# Patient Record
Sex: Female | Born: 1940 | Race: Black or African American | Hispanic: No | Marital: Married | Smoking: Never smoker
Health system: Southern US, Community
[De-identification: ages and names within clinical notes are randomized; demographics above are authoritative.]

## PROBLEM LIST (undated history)

## (undated) DIAGNOSIS — Z801 Family history of malignant neoplasm of trachea, bronchus and lung: Secondary | ICD-10-CM

## (undated) DIAGNOSIS — E119 Type 2 diabetes mellitus without complications: Secondary | ICD-10-CM

## (undated) DIAGNOSIS — Z8042 Family history of malignant neoplasm of prostate: Secondary | ICD-10-CM

## (undated) DIAGNOSIS — Z800 Family history of malignant neoplasm of digestive organs: Secondary | ICD-10-CM

## (undated) DIAGNOSIS — Z807 Family history of other malignant neoplasms of lymphoid, hematopoietic and related tissues: Secondary | ICD-10-CM

## (undated) DIAGNOSIS — I10 Essential (primary) hypertension: Secondary | ICD-10-CM

## (undated) DIAGNOSIS — E7800 Pure hypercholesterolemia, unspecified: Secondary | ICD-10-CM

## (undated) DIAGNOSIS — E039 Hypothyroidism, unspecified: Secondary | ICD-10-CM

## (undated) DIAGNOSIS — C259 Malignant neoplasm of pancreas, unspecified: Secondary | ICD-10-CM

## (undated) HISTORY — DX: Family history of malignant neoplasm of digestive organs: Z80.0

## (undated) HISTORY — DX: Family history of malignant neoplasm of trachea, bronchus and lung: Z80.1

## (undated) HISTORY — DX: Family history of other malignant neoplasms of lymphoid, hematopoietic and related tissues: Z80.7

## (undated) HISTORY — DX: Family history of malignant neoplasm of prostate: Z80.42

## (undated) HISTORY — PX: FINGER SURGERY: SHX640

---

## 1997-07-04 ENCOUNTER — Ambulatory Visit (HOSPITAL_COMMUNITY): Admission: RE | Admit: 1997-07-04 | Discharge: 1997-07-04 | Payer: Self-pay | Admitting: Endocrinology

## 1998-07-07 ENCOUNTER — Ambulatory Visit (HOSPITAL_COMMUNITY): Admission: RE | Admit: 1998-07-07 | Discharge: 1998-07-07 | Payer: Self-pay | Admitting: Endocrinology

## 1998-07-07 ENCOUNTER — Encounter: Payer: Self-pay | Admitting: Endocrinology

## 1999-01-15 ENCOUNTER — Other Ambulatory Visit: Admission: RE | Admit: 1999-01-15 | Discharge: 1999-01-15 | Payer: Self-pay | Admitting: Endocrinology

## 1999-08-04 ENCOUNTER — Ambulatory Visit (HOSPITAL_COMMUNITY): Admission: RE | Admit: 1999-08-04 | Discharge: 1999-08-04 | Payer: Self-pay | Admitting: Endocrinology

## 1999-08-04 ENCOUNTER — Encounter: Payer: Self-pay | Admitting: Endocrinology

## 2000-03-03 ENCOUNTER — Other Ambulatory Visit: Admission: RE | Admit: 2000-03-03 | Discharge: 2000-03-03 | Payer: Self-pay | Admitting: Endocrinology

## 2000-10-28 ENCOUNTER — Encounter: Payer: Self-pay | Admitting: Endocrinology

## 2000-10-28 ENCOUNTER — Ambulatory Visit (HOSPITAL_COMMUNITY): Admission: RE | Admit: 2000-10-28 | Discharge: 2000-10-28 | Payer: Self-pay | Admitting: Endocrinology

## 2001-12-15 ENCOUNTER — Ambulatory Visit (HOSPITAL_COMMUNITY): Admission: RE | Admit: 2001-12-15 | Discharge: 2001-12-15 | Payer: Self-pay | Admitting: Endocrinology

## 2001-12-15 ENCOUNTER — Encounter: Payer: Self-pay | Admitting: Endocrinology

## 2002-05-03 ENCOUNTER — Other Ambulatory Visit: Admission: RE | Admit: 2002-05-03 | Discharge: 2002-05-03 | Payer: Self-pay | Admitting: Endocrinology

## 2003-09-03 ENCOUNTER — Other Ambulatory Visit: Admission: RE | Admit: 2003-09-03 | Discharge: 2003-09-03 | Payer: Self-pay | Admitting: Obstetrics & Gynecology

## 2003-09-12 ENCOUNTER — Ambulatory Visit (HOSPITAL_COMMUNITY): Admission: RE | Admit: 2003-09-12 | Discharge: 2003-09-12 | Payer: Self-pay | Admitting: Endocrinology

## 2003-10-28 ENCOUNTER — Encounter (INDEPENDENT_AMBULATORY_CARE_PROVIDER_SITE_OTHER): Payer: Self-pay | Admitting: Specialist

## 2003-10-28 ENCOUNTER — Ambulatory Visit (HOSPITAL_COMMUNITY): Admission: RE | Admit: 2003-10-28 | Discharge: 2003-10-28 | Payer: Self-pay | Admitting: Obstetrics & Gynecology

## 2004-10-21 ENCOUNTER — Ambulatory Visit (HOSPITAL_COMMUNITY): Admission: RE | Admit: 2004-10-21 | Discharge: 2004-10-21 | Payer: Self-pay | Admitting: Gastroenterology

## 2005-06-30 ENCOUNTER — Ambulatory Visit (HOSPITAL_COMMUNITY): Admission: RE | Admit: 2005-06-30 | Discharge: 2005-06-30 | Payer: Self-pay | Admitting: Endocrinology

## 2006-07-20 ENCOUNTER — Ambulatory Visit (HOSPITAL_COMMUNITY): Admission: RE | Admit: 2006-07-20 | Discharge: 2006-07-20 | Payer: Self-pay | Admitting: Endocrinology

## 2007-08-03 ENCOUNTER — Ambulatory Visit (HOSPITAL_COMMUNITY): Admission: RE | Admit: 2007-08-03 | Discharge: 2007-08-03 | Payer: Self-pay | Admitting: Endocrinology

## 2008-05-22 ENCOUNTER — Emergency Department (HOSPITAL_COMMUNITY): Admission: EM | Admit: 2008-05-22 | Discharge: 2008-05-22 | Payer: Self-pay | Admitting: Family Medicine

## 2008-08-08 ENCOUNTER — Ambulatory Visit (HOSPITAL_COMMUNITY): Admission: RE | Admit: 2008-08-08 | Discharge: 2008-08-08 | Payer: Self-pay | Admitting: Endocrinology

## 2009-08-12 ENCOUNTER — Ambulatory Visit (HOSPITAL_COMMUNITY): Admission: RE | Admit: 2009-08-12 | Discharge: 2009-08-12 | Payer: Self-pay | Admitting: Endocrinology

## 2009-08-21 ENCOUNTER — Encounter: Admission: RE | Admit: 2009-08-21 | Discharge: 2009-08-21 | Payer: Self-pay | Admitting: Endocrinology

## 2009-10-09 ENCOUNTER — Emergency Department (HOSPITAL_COMMUNITY): Admission: EM | Admit: 2009-10-09 | Discharge: 2009-10-09 | Payer: Self-pay | Admitting: Family Medicine

## 2010-09-03 ENCOUNTER — Other Ambulatory Visit (HOSPITAL_COMMUNITY): Payer: Self-pay | Admitting: Endocrinology

## 2010-09-03 DIAGNOSIS — Z1231 Encounter for screening mammogram for malignant neoplasm of breast: Secondary | ICD-10-CM

## 2010-09-04 NOTE — Op Note (Signed)
[name], Tina Patel                          ACCOUNT NO.:  [id number]   MEDICAL RECORD NO.:  [id number]                   PATIENT TYPE:  AMB   LOCATION:  SDC                                  FACILITY:  [facility]   PHYSICIAN:  Genia Del, M.D.             DATE OF BIRTH:  01-Apr-1941   DATE OF PROCEDURE:  10/28/2003  DATE OF DISCHARGE:                                 OPERATIVE REPORT   PREOPERATIVE DIAGNOSIS:  Postmenopausal bleeding with thickened endometrium.   POSTOPERATIVE DIAGNOSIS:  Intrauterine lesion, probable polyp, with  atrophied endometrium.   SURGEON:  Genia Del, M.D.   ANESTHESIA:  Burnett Corrente, M.D.   INTERVENTION:  Hysteroscopy with resection of intrauterine lesion and  dilatation and curettage.   PROCEDURE:  Under MAC analgesia, the patient is in lithotomy position.  She  is prepped with Hibiclens on the suprapubic, vulvar, and vaginal area.  The  bladder is catheterized and the patient is draped as usual.  The vaginal  exam reveals a retroverted uterus, normal volume, mobile, no adnexal mass.  We insert the speculum in the vagina.  The anterior lip of the cervix is  grasped with a tenaculum.  The paracervical block is done with Nesacaine 1%,  a total of 20 mL at 4 and 8 o'clock.  We then proceed with dilatation with  Hegar dilators up to #25 without difficulty.  A diagnostic hysteroscope is  introduced in the intrauterine cavity.  Both ostia are seen and pictures are  taken.  A posterior intrauterine lesion is visualized.  It is on a pedicle,  measuring about 2 cm in diameter.  It appears soft, compatible with a polyp.  A picture is taken.  We remove the diagnostic hysteroscope.  We complete  dilation at Hegar #37 easily.  We then insert the operative hysteroscope  with a single loop in the intrauterine cavity.  We proceed with resection of  the intrauterine lesion.  This is done easily with good vision.  The  specimen is sent to pathology.  The  lesion is completely resected.  We then  proceed with a systematic curettage on all endometrial cavity, and this is  sent separately to pathology.  We then take a last look with the  hysteroscope.  The cavity appears regular and normal, no lesion is seen.  The endometrium was thin, probably atrophied, so the endometrial curetting  specimen was small.  Hemostasis is adequate.  We take pictures as we come  out of the intrauterine cavity.  All instruments are removed.  Hemostasis is  adequate on the cervix as well.  The estimated blood loss was minimal.  Fluid deficit was 200 mL.  No complication occurred, and the patient was  brought to the recovery room in good, stable status.  Genia Del, M.D.    ML/MEDQ  D:  10/28/2003  T:  10/28/2003  Job:  [id number]

## 2010-09-04 NOTE — Op Note (Signed)
NAMELINDALOU, SOLTIS                ACCOUNT NO.:  [id number]   MEDICAL RECORD NO.:  [id number]          PATIENT TYPE:  AMB   LOCATION:  ENDO                         FACILITY:  [facility]   PHYSICIAN:  Anselmo Rod, M.D.  DATE OF BIRTH:  [birth year]   DATE OF PROCEDURE:  10/21/2004  DATE OF DISCHARGE:                                 OPERATIVE REPORT   PROCEDURE PERFORMED:  Screening colonoscopy.   ENDOSCOPIST:  Charna Elizabeth, M.D.   INSTRUMENT USED:  Olympus video colonoscope.   INDICATION FOR PROCEDURE:  This 70 year old African-American female  underwent screening colonoscopy to rule out colonic polyps, masses, etc.   PREPROCEDURE PREPARATION:  Informed consent was procured from the patient.  The patient fasted for 8 hours prior to the procedure and prepped with a  bottle of magnesium citrate and a gallon of GoLYTELY the night prior to the  procedure.  Risks and benefits of the procedure including a 10% miss rate of  cancer and polyps was discussed with the patient as well.   PREPROCEDURE PHYSICAL:  Patient had stable vital signs.  NECK:  Supple.  CHEST:  Clear to auscultation.  S1, S2 regular.  ABDOMEN:  Soft with normal bowel sounds.   DESCRIPTION OF THE PROCEDURE:  The patient was placed in the left lateral  decubitus position, sedated with 5-0 mg of Demerol and 5 mg of Versed in  slow incremental doses.  Once the patient was adequately sedated and  maintained on low flow oxygen and continuous cardiac monitoring, the Olympus  video colonoscope was advanced from the rectum to the cecum.  The  appendicular orifice and ileocecal valve were visualized and photographed.  The patient had pan diverticular disease with more prominent changes in the  left colon, small internal  hemorrhoids were seen on retroflexion.  The rest  of the exam was unremarkable.  There was some residual stool in the colon,  multiple washings were done.  Retroflexion in the rectum revealed a  prominent internal  hemorrhoid.  The patient tolerated the procedure well  without complications.   IMPRESSION:  1.  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  2.  Prominent internal hemorrhoids seen on retroflexion.  3.  No masses or polyps seen.   RECOMMENDATIONS:  1.  Continue a high fiber diet with liberal fluid intake.  2.  Brochures on diverticulosis have been given to the patient for      education.  3.  Repeat colonoscopy in the next 10 years unless the patient develops any      abnormal symptoms in the interim.  4.  Outpatient followup as need arises in the future.       JNM/MEDQ  D:  10/22/2004  T:  10/22/2004  Job:  [id number]   cc:   Genia Del, M.D.  [address]  [place]  [place] [address]  Fax: [phone]   Brooke Bonito, M.D.  [address]  [place]  [place] [address]  Fax: [phone]

## 2010-09-15 ENCOUNTER — Ambulatory Visit (HOSPITAL_COMMUNITY): Payer: Medicare Other

## 2010-09-15 ENCOUNTER — Ambulatory Visit (HOSPITAL_COMMUNITY)
Admission: RE | Admit: 2010-09-15 | Discharge: 2010-09-15 | Disposition: A | Discharge status: Home / Self Care | Payer: Medicare Other | Source: Ambulatory Visit | Attending: Endocrinology | Admitting: Endocrinology

## 2010-09-15 DIAGNOSIS — Z1231 Encounter for screening mammogram for malignant neoplasm of breast: Secondary | ICD-10-CM

## 2010-09-17 ENCOUNTER — Other Ambulatory Visit: Payer: Self-pay | Admitting: Endocrinology

## 2010-09-17 DIAGNOSIS — R928 Other abnormal and inconclusive findings on diagnostic imaging of breast: Secondary | ICD-10-CM

## 2010-09-24 ENCOUNTER — Ambulatory Visit
Admission: RE | Admit: 2010-09-24 | Discharge: 2010-09-24 | Disposition: A | Discharge status: Home / Self Care | Payer: Medicare Other | Source: Ambulatory Visit | Attending: Endocrinology | Admitting: Endocrinology

## 2010-09-24 DIAGNOSIS — R928 Other abnormal and inconclusive findings on diagnostic imaging of breast: Secondary | ICD-10-CM

## 2011-05-05 ENCOUNTER — Emergency Department (HOSPITAL_COMMUNITY)
Admission: EM | Admit: 2011-05-05 | Discharge: 2011-05-05 | Disposition: A | Discharge status: Home / Self Care | Payer: Medicare Other | Source: Home / Self Care | Attending: Family Medicine | Admitting: Family Medicine

## 2011-05-05 ENCOUNTER — Encounter (HOSPITAL_COMMUNITY): Payer: Self-pay | Admitting: *Deleted

## 2011-05-05 DIAGNOSIS — IMO0001 Reserved for inherently not codable concepts without codable children: Secondary | ICD-10-CM

## 2011-05-05 DIAGNOSIS — L03019 Cellulitis of unspecified finger: Secondary | ICD-10-CM

## 2011-05-05 HISTORY — DX: Hypothyroidism, unspecified: E03.9

## 2011-05-05 HISTORY — DX: Pure hypercholesterolemia, unspecified: E78.00

## 2011-05-05 LAB — CULTURE, ROUTINE-ABSCESS

## 2011-05-05 MED ORDER — DOXYCYCLINE HYCLATE 100 MG PO CAPS: 100.0000 mg | ORAL_CAPSULE | Freq: Two times a day (BID) | ORAL | Status: AC

## 2011-05-05 NOTE — ED Notes (Signed)
Pain swelling right middle finger nail bed onset x 6 days progressively worse

## 2011-05-05 NOTE — ED Provider Notes (Signed)
History     CSN: [id number]  Arrival date & time 05/05/11  1909   First MD Initiated Contact with Patient 05/05/11 1925      No chief complaint on file.   (Consider location/radiation/quality/duration/timing/severity/associated sxs/prior treatment) Patient is a 71 y.o. female presenting with hand pain. The history is provided by the patient.  Hand Pain This is a new problem. The current episode started more than 2 days ago (pt thinks she may have stuck finger with needle). The problem occurs constantly. The problem has been gradually worsening. She has tried nothing for the symptoms.    Past Medical History  Diagnosis Date  . Diabetes mellitus   . High cholesterol   . Hypothyroid     Past Surgical History  Procedure Date  . Finger surgery     History reviewed. No pertinent family history.  History  Substance Use Topics  . Smoking status: Never Smoker   . Smokeless tobacco: Not on file  . Alcohol Use: No    OB History    Grav Para Term Preterm Abortions TAB SAB Ect Mult Living                  Review of Systems  Constitutional: Negative.   Musculoskeletal: Positive for joint swelling.  Skin: Positive for wound.    Allergies  Review of patient's allergies indicates no known allergies.  Home Medications   Current Outpatient Rx  Name Route Sig Dispense Refill  . LEVOTHYROXINE SODIUM 88 MCG PO TABS Oral Take 88 mcg by mouth daily.    Marland Kitchen METFORMIN HCL 500 MG PO TABS Oral Take 500 mg by mouth once.    Marland Kitchen ROSUVASTATIN CALCIUM 20 MG PO TABS Oral Take 20 mg by mouth daily.    Marland Kitchen DOXYCYCLINE HYCLATE 100 MG PO CAPS Oral Take 1 capsule (100 mg total) by mouth 2 (two) times daily. 20 capsule 0    BP 158/85  Pulse 87  Temp(Src) 98 F (36.7 C) (Oral)  Resp 17  SpO2 96%  Physical Exam  Nursing note and vitals reviewed. Constitutional: She appears well-developed and well-nourished.  Musculoskeletal: She exhibits edema and tenderness.       Hands: Skin: Skin is  warm and dry.    ED Course  INCISION AND DRAINAGE Date/Time: 05/05/2011 9:10 PM Performed by: Barkley Bruns Authorized by: Barkley Bruns Consent: Verbal consent obtained. Type: abscess Body area: upper extremity Location details: right long finger Local anesthetic: topical anesthetic Patient sedated: no Scalpel size: 11 Incision type: single straight Complexity: simple Drainage: purulent Drainage amount: moderate Wound treatment: wound left open Patient tolerance: Patient tolerated the procedure well with no immediate complications.   (including critical care time)   Labs Reviewed  CULTURE, ROUTINE-ABSCESS   No results found.   1. Paronychia of third finger of right hand       MDM  I+D performed, purulent fluid.        Barkley Bruns, MD 05/05/11 2115

## 2011-05-11 ENCOUNTER — Telehealth (HOSPITAL_COMMUNITY): Payer: Self-pay | Admitting: *Deleted

## 2011-05-11 NOTE — ED Notes (Addendum)
Abscess culture R middle finger: few E. Coli.  Pt. treated with doxycycline not on sensitivity. Order obtained from Dr. Ladon Applebaum for Bactrim DS 1 tab po BID x 10 days. I called and husband said pt. is hard of hearint. I told him to tell pt. to stop Doxycycline and start Bactrim DS twice/day x 10 days. He said to call Rx. to [facility] on [address]. Rx. called to pharmacist @ [phone]. Vassie Moselle 05/11/2011

## 2011-10-04 ENCOUNTER — Other Ambulatory Visit (HOSPITAL_COMMUNITY): Payer: Self-pay | Admitting: Endocrinology

## 2011-10-04 DIAGNOSIS — Z1231 Encounter for screening mammogram for malignant neoplasm of breast: Secondary | ICD-10-CM

## 2011-10-28 ENCOUNTER — Ambulatory Visit (HOSPITAL_COMMUNITY)
Admission: RE | Admit: 2011-10-28 | Discharge: 2011-10-28 | Disposition: A | Discharge status: Home / Self Care | Payer: Medicare Other | Source: Ambulatory Visit | Attending: Endocrinology | Admitting: Endocrinology

## 2011-10-28 DIAGNOSIS — Z1231 Encounter for screening mammogram for malignant neoplasm of breast: Secondary | ICD-10-CM | POA: Insufficient documentation

## 2012-11-15 ENCOUNTER — Other Ambulatory Visit (HOSPITAL_COMMUNITY): Payer: Self-pay | Admitting: Endocrinology

## 2012-11-15 DIAGNOSIS — Z1231 Encounter for screening mammogram for malignant neoplasm of breast: Secondary | ICD-10-CM

## 2012-11-20 ENCOUNTER — Ambulatory Visit (HOSPITAL_COMMUNITY)
Admission: RE | Admit: 2012-11-20 | Discharge: 2012-11-20 | Disposition: A | Discharge status: Home / Self Care | Payer: Medicare Other | Source: Ambulatory Visit | Attending: Endocrinology | Admitting: Endocrinology

## 2012-11-20 DIAGNOSIS — Z1231 Encounter for screening mammogram for malignant neoplasm of breast: Secondary | ICD-10-CM | POA: Insufficient documentation

## 2013-10-30 ENCOUNTER — Other Ambulatory Visit (HOSPITAL_COMMUNITY): Payer: Self-pay | Admitting: Endocrinology

## 2013-10-30 DIAGNOSIS — Z1231 Encounter for screening mammogram for malignant neoplasm of breast: Secondary | ICD-10-CM

## 2013-11-21 ENCOUNTER — Ambulatory Visit (HOSPITAL_COMMUNITY)
Admission: RE | Admit: 2013-11-21 | Discharge: 2013-11-21 | Disposition: A | Discharge status: Home / Self Care | Payer: Medicare Other | Source: Ambulatory Visit | Attending: Endocrinology | Admitting: Endocrinology

## 2013-11-21 DIAGNOSIS — Z1231 Encounter for screening mammogram for malignant neoplasm of breast: Secondary | ICD-10-CM

## 2014-09-03 DIAGNOSIS — Z1231 Encounter for screening mammogram for malignant neoplasm of breast: Secondary | ICD-10-CM | POA: Diagnosis not present

## 2014-09-03 DIAGNOSIS — Z01419 Encounter for gynecological examination (general) (routine) without abnormal findings: Secondary | ICD-10-CM | POA: Diagnosis not present

## 2014-09-19 DIAGNOSIS — E789 Disorder of lipoprotein metabolism, unspecified: Secondary | ICD-10-CM | POA: Diagnosis not present

## 2014-09-19 DIAGNOSIS — E118 Type 2 diabetes mellitus with unspecified complications: Secondary | ICD-10-CM | POA: Diagnosis not present

## 2014-09-19 DIAGNOSIS — E0789 Other specified disorders of thyroid: Secondary | ICD-10-CM | POA: Diagnosis not present

## 2014-09-26 DIAGNOSIS — E118 Type 2 diabetes mellitus with unspecified complications: Secondary | ICD-10-CM | POA: Diagnosis not present

## 2014-09-26 DIAGNOSIS — E032 Hypothyroidism due to medicaments and other exogenous substances: Secondary | ICD-10-CM | POA: Diagnosis not present

## 2014-09-26 DIAGNOSIS — E789 Disorder of lipoprotein metabolism, unspecified: Secondary | ICD-10-CM | POA: Diagnosis not present

## 2014-10-08 DIAGNOSIS — K5730 Diverticulosis of large intestine without perforation or abscess without bleeding: Secondary | ICD-10-CM | POA: Diagnosis not present

## 2014-10-08 DIAGNOSIS — K921 Melena: Secondary | ICD-10-CM | POA: Diagnosis not present

## 2014-10-08 DIAGNOSIS — Z1211 Encounter for screening for malignant neoplasm of colon: Secondary | ICD-10-CM | POA: Diagnosis not present

## 2014-11-27 DIAGNOSIS — K921 Melena: Secondary | ICD-10-CM | POA: Diagnosis not present

## 2014-11-27 DIAGNOSIS — D374 Neoplasm of uncertain behavior of colon: Secondary | ICD-10-CM | POA: Diagnosis not present

## 2014-11-27 DIAGNOSIS — D120 Benign neoplasm of cecum: Secondary | ICD-10-CM | POA: Diagnosis not present

## 2014-11-27 DIAGNOSIS — K5730 Diverticulosis of large intestine without perforation or abscess without bleeding: Secondary | ICD-10-CM | POA: Diagnosis not present

## 2014-11-27 DIAGNOSIS — K635 Polyp of colon: Secondary | ICD-10-CM | POA: Diagnosis not present

## 2014-11-27 DIAGNOSIS — Z1211 Encounter for screening for malignant neoplasm of colon: Secondary | ICD-10-CM | POA: Diagnosis not present

## 2015-03-18 DIAGNOSIS — E0789 Other specified disorders of thyroid: Secondary | ICD-10-CM | POA: Diagnosis not present

## 2015-03-18 DIAGNOSIS — Z79899 Other long term (current) drug therapy: Secondary | ICD-10-CM | POA: Diagnosis not present

## 2015-03-18 DIAGNOSIS — E789 Disorder of lipoprotein metabolism, unspecified: Secondary | ICD-10-CM | POA: Diagnosis not present

## 2015-03-18 DIAGNOSIS — I10 Essential (primary) hypertension: Secondary | ICD-10-CM | POA: Diagnosis not present

## 2015-03-18 DIAGNOSIS — E118 Type 2 diabetes mellitus with unspecified complications: Secondary | ICD-10-CM | POA: Diagnosis not present

## 2015-03-25 DIAGNOSIS — E118 Type 2 diabetes mellitus with unspecified complications: Secondary | ICD-10-CM | POA: Diagnosis not present

## 2015-03-25 DIAGNOSIS — E789 Disorder of lipoprotein metabolism, unspecified: Secondary | ICD-10-CM | POA: Diagnosis not present

## 2015-03-25 DIAGNOSIS — I10 Essential (primary) hypertension: Secondary | ICD-10-CM | POA: Diagnosis not present

## 2015-03-25 DIAGNOSIS — E032 Hypothyroidism due to medicaments and other exogenous substances: Secondary | ICD-10-CM | POA: Diagnosis not present

## 2015-06-10 DIAGNOSIS — E119 Type 2 diabetes mellitus without complications: Secondary | ICD-10-CM | POA: Diagnosis not present

## 2015-09-12 DIAGNOSIS — Z124 Encounter for screening for malignant neoplasm of cervix: Secondary | ICD-10-CM | POA: Diagnosis not present

## 2015-09-12 DIAGNOSIS — Z1231 Encounter for screening mammogram for malignant neoplasm of breast: Secondary | ICD-10-CM | POA: Diagnosis not present

## 2015-09-16 DIAGNOSIS — E118 Type 2 diabetes mellitus with unspecified complications: Secondary | ICD-10-CM | POA: Diagnosis not present

## 2015-09-16 DIAGNOSIS — E789 Disorder of lipoprotein metabolism, unspecified: Secondary | ICD-10-CM | POA: Diagnosis not present

## 2015-09-16 DIAGNOSIS — E0789 Other specified disorders of thyroid: Secondary | ICD-10-CM | POA: Diagnosis not present

## 2015-09-23 DIAGNOSIS — E118 Type 2 diabetes mellitus with unspecified complications: Secondary | ICD-10-CM | POA: Diagnosis not present

## 2015-09-23 DIAGNOSIS — E032 Hypothyroidism due to medicaments and other exogenous substances: Secondary | ICD-10-CM | POA: Diagnosis not present

## 2015-09-23 DIAGNOSIS — I10 Essential (primary) hypertension: Secondary | ICD-10-CM | POA: Diagnosis not present

## 2015-10-07 DIAGNOSIS — H02825 Cysts of left lower eyelid: Secondary | ICD-10-CM | POA: Diagnosis not present

## 2016-03-30 DIAGNOSIS — E118 Type 2 diabetes mellitus with unspecified complications: Secondary | ICD-10-CM | POA: Diagnosis not present

## 2016-03-30 DIAGNOSIS — I10 Essential (primary) hypertension: Secondary | ICD-10-CM | POA: Diagnosis not present

## 2016-04-06 DIAGNOSIS — E789 Disorder of lipoprotein metabolism, unspecified: Secondary | ICD-10-CM | POA: Diagnosis not present

## 2016-04-06 DIAGNOSIS — E118 Type 2 diabetes mellitus with unspecified complications: Secondary | ICD-10-CM | POA: Diagnosis not present

## 2016-04-06 DIAGNOSIS — I10 Essential (primary) hypertension: Secondary | ICD-10-CM | POA: Diagnosis not present

## 2016-04-06 DIAGNOSIS — H6090 Unspecified otitis externa, unspecified ear: Secondary | ICD-10-CM | POA: Diagnosis not present

## 2016-09-01 DIAGNOSIS — E119 Type 2 diabetes mellitus without complications: Secondary | ICD-10-CM | POA: Diagnosis not present

## 2016-09-16 DIAGNOSIS — Z1231 Encounter for screening mammogram for malignant neoplasm of breast: Secondary | ICD-10-CM | POA: Diagnosis not present

## 2016-09-28 DIAGNOSIS — N390 Urinary tract infection, site not specified: Secondary | ICD-10-CM | POA: Diagnosis not present

## 2016-09-28 DIAGNOSIS — Z0000 Encounter for general adult medical examination without abnormal findings: Secondary | ICD-10-CM | POA: Diagnosis not present

## 2016-09-28 DIAGNOSIS — E118 Type 2 diabetes mellitus with unspecified complications: Secondary | ICD-10-CM | POA: Diagnosis not present

## 2016-09-28 DIAGNOSIS — E789 Disorder of lipoprotein metabolism, unspecified: Secondary | ICD-10-CM | POA: Diagnosis not present

## 2016-10-05 DIAGNOSIS — E118 Type 2 diabetes mellitus with unspecified complications: Secondary | ICD-10-CM | POA: Diagnosis not present

## 2016-10-05 DIAGNOSIS — E789 Disorder of lipoprotein metabolism, unspecified: Secondary | ICD-10-CM | POA: Diagnosis not present

## 2016-10-05 DIAGNOSIS — E032 Hypothyroidism due to medicaments and other exogenous substances: Secondary | ICD-10-CM | POA: Diagnosis not present

## 2016-10-05 DIAGNOSIS — N390 Urinary tract infection, site not specified: Secondary | ICD-10-CM | POA: Diagnosis not present

## 2017-01-04 DIAGNOSIS — H25013 Cortical age-related cataract, bilateral: Secondary | ICD-10-CM | POA: Diagnosis not present

## 2017-01-04 DIAGNOSIS — H25043 Posterior subcapsular polar age-related cataract, bilateral: Secondary | ICD-10-CM | POA: Diagnosis not present

## 2017-01-04 DIAGNOSIS — H2513 Age-related nuclear cataract, bilateral: Secondary | ICD-10-CM | POA: Diagnosis not present

## 2017-01-04 DIAGNOSIS — H2512 Age-related nuclear cataract, left eye: Secondary | ICD-10-CM | POA: Diagnosis not present

## 2017-01-04 DIAGNOSIS — H18411 Arcus senilis, right eye: Secondary | ICD-10-CM | POA: Diagnosis not present

## 2017-01-20 ENCOUNTER — Other Ambulatory Visit: Payer: Self-pay

## 2017-01-20 NOTE — Patient Outreach (Signed)
[facility]) Care Management  01/20/2017  Tina Patel November 08, 1940 [id number]   Medication adherence call to Tina Patel the reason for this call is because Tina Patel is showing past due under [facility] Ins.on her pravastatin 80 mg spoke to patient husband he said she is only taking this medication three times a week instead of once a day because of side effects per doctor Tina Patel new instructions.   [facility] Direct Dial [phone]  Fax [phone] [name]

## 2017-04-07 ENCOUNTER — Other Ambulatory Visit: Payer: Self-pay | Admitting: Internal Medicine

## 2017-04-07 DIAGNOSIS — Z1231 Encounter for screening mammogram for malignant neoplasm of breast: Secondary | ICD-10-CM

## 2017-10-03 DIAGNOSIS — Z1231 Encounter for screening mammogram for malignant neoplasm of breast: Secondary | ICD-10-CM | POA: Diagnosis not present

## 2017-10-03 DIAGNOSIS — Z01419 Encounter for gynecological examination (general) (routine) without abnormal findings: Secondary | ICD-10-CM | POA: Diagnosis not present

## 2017-10-04 DIAGNOSIS — N390 Urinary tract infection, site not specified: Secondary | ICD-10-CM | POA: Diagnosis not present

## 2017-10-04 DIAGNOSIS — E118 Type 2 diabetes mellitus with unspecified complications: Secondary | ICD-10-CM | POA: Diagnosis not present

## 2017-10-04 DIAGNOSIS — E039 Hypothyroidism, unspecified: Secondary | ICD-10-CM | POA: Diagnosis not present

## 2017-10-04 DIAGNOSIS — I10 Essential (primary) hypertension: Secondary | ICD-10-CM | POA: Diagnosis not present

## 2017-10-11 DIAGNOSIS — I10 Essential (primary) hypertension: Secondary | ICD-10-CM | POA: Diagnosis not present

## 2017-10-11 DIAGNOSIS — E039 Hypothyroidism, unspecified: Secondary | ICD-10-CM | POA: Diagnosis not present

## 2017-10-11 DIAGNOSIS — E0789 Other specified disorders of thyroid: Secondary | ICD-10-CM | POA: Diagnosis not present

## 2017-10-11 DIAGNOSIS — Z23 Encounter for immunization: Secondary | ICD-10-CM | POA: Diagnosis not present

## 2017-10-11 DIAGNOSIS — Z0000 Encounter for general adult medical examination without abnormal findings: Secondary | ICD-10-CM | POA: Diagnosis not present

## 2017-10-11 DIAGNOSIS — E7800 Pure hypercholesterolemia, unspecified: Secondary | ICD-10-CM | POA: Diagnosis not present

## 2017-10-11 DIAGNOSIS — E118 Type 2 diabetes mellitus with unspecified complications: Secondary | ICD-10-CM | POA: Diagnosis not present

## 2017-10-27 DIAGNOSIS — M79641 Pain in right hand: Secondary | ICD-10-CM | POA: Diagnosis not present

## 2018-01-05 DIAGNOSIS — E0789 Other specified disorders of thyroid: Secondary | ICD-10-CM | POA: Diagnosis not present

## 2018-01-05 DIAGNOSIS — E118 Type 2 diabetes mellitus with unspecified complications: Secondary | ICD-10-CM | POA: Diagnosis not present

## 2018-01-12 DIAGNOSIS — E118 Type 2 diabetes mellitus with unspecified complications: Secondary | ICD-10-CM | POA: Diagnosis not present

## 2018-01-12 DIAGNOSIS — I10 Essential (primary) hypertension: Secondary | ICD-10-CM | POA: Diagnosis not present

## 2018-01-12 DIAGNOSIS — E039 Hypothyroidism, unspecified: Secondary | ICD-10-CM | POA: Diagnosis not present

## 2018-03-01 DIAGNOSIS — R300 Dysuria: Secondary | ICD-10-CM | POA: Diagnosis not present

## 2018-03-01 DIAGNOSIS — N390 Urinary tract infection, site not specified: Secondary | ICD-10-CM | POA: Diagnosis not present

## 2018-03-01 DIAGNOSIS — R8290 Unspecified abnormal findings in urine: Secondary | ICD-10-CM | POA: Diagnosis not present

## 2018-07-06 DIAGNOSIS — Z0000 Encounter for general adult medical examination without abnormal findings: Secondary | ICD-10-CM | POA: Diagnosis not present

## 2018-07-06 DIAGNOSIS — E118 Type 2 diabetes mellitus with unspecified complications: Secondary | ICD-10-CM | POA: Diagnosis not present

## 2018-07-06 DIAGNOSIS — E7800 Pure hypercholesterolemia, unspecified: Secondary | ICD-10-CM | POA: Diagnosis not present

## 2018-07-06 DIAGNOSIS — E785 Hyperlipidemia, unspecified: Secondary | ICD-10-CM | POA: Diagnosis not present

## 2018-07-06 DIAGNOSIS — I10 Essential (primary) hypertension: Secondary | ICD-10-CM | POA: Diagnosis not present

## 2018-07-10 DIAGNOSIS — E118 Type 2 diabetes mellitus with unspecified complications: Secondary | ICD-10-CM | POA: Diagnosis not present

## 2018-07-13 DIAGNOSIS — E785 Hyperlipidemia, unspecified: Secondary | ICD-10-CM | POA: Diagnosis not present

## 2018-07-13 DIAGNOSIS — E039 Hypothyroidism, unspecified: Secondary | ICD-10-CM | POA: Diagnosis not present

## 2018-07-13 DIAGNOSIS — D179 Benign lipomatous neoplasm, unspecified: Secondary | ICD-10-CM | POA: Diagnosis not present

## 2018-07-13 DIAGNOSIS — I10 Essential (primary) hypertension: Secondary | ICD-10-CM | POA: Diagnosis not present

## 2018-07-13 DIAGNOSIS — E118 Type 2 diabetes mellitus with unspecified complications: Secondary | ICD-10-CM | POA: Diagnosis not present

## 2018-07-31 DIAGNOSIS — M546 Pain in thoracic spine: Secondary | ICD-10-CM | POA: Diagnosis not present

## 2018-07-31 DIAGNOSIS — M549 Dorsalgia, unspecified: Secondary | ICD-10-CM | POA: Diagnosis not present

## 2018-07-31 DIAGNOSIS — M5136 Other intervertebral disc degeneration, lumbar region: Secondary | ICD-10-CM | POA: Diagnosis not present

## 2018-11-08 DIAGNOSIS — I10 Essential (primary) hypertension: Secondary | ICD-10-CM | POA: Diagnosis not present

## 2018-11-08 DIAGNOSIS — E039 Hypothyroidism, unspecified: Secondary | ICD-10-CM | POA: Diagnosis not present

## 2018-11-08 DIAGNOSIS — E118 Type 2 diabetes mellitus with unspecified complications: Secondary | ICD-10-CM | POA: Diagnosis not present

## 2018-11-08 DIAGNOSIS — E785 Hyperlipidemia, unspecified: Secondary | ICD-10-CM | POA: Diagnosis not present

## 2018-11-15 DIAGNOSIS — E118 Type 2 diabetes mellitus with unspecified complications: Secondary | ICD-10-CM | POA: Diagnosis not present

## 2018-11-15 DIAGNOSIS — Z0000 Encounter for general adult medical examination without abnormal findings: Secondary | ICD-10-CM | POA: Diagnosis not present

## 2018-11-15 DIAGNOSIS — E785 Hyperlipidemia, unspecified: Secondary | ICD-10-CM | POA: Diagnosis not present

## 2019-02-06 DIAGNOSIS — H26493 Other secondary cataract, bilateral: Secondary | ICD-10-CM | POA: Diagnosis not present

## 2019-02-27 DIAGNOSIS — H26491 Other secondary cataract, right eye: Secondary | ICD-10-CM | POA: Diagnosis not present

## 2019-02-27 DIAGNOSIS — E119 Type 2 diabetes mellitus without complications: Secondary | ICD-10-CM | POA: Diagnosis not present

## 2019-02-27 DIAGNOSIS — H26492 Other secondary cataract, left eye: Secondary | ICD-10-CM | POA: Diagnosis not present

## 2019-02-27 DIAGNOSIS — I10 Essential (primary) hypertension: Secondary | ICD-10-CM | POA: Diagnosis not present

## 2019-05-11 DIAGNOSIS — I10 Essential (primary) hypertension: Secondary | ICD-10-CM | POA: Diagnosis not present

## 2019-05-11 DIAGNOSIS — E039 Hypothyroidism, unspecified: Secondary | ICD-10-CM | POA: Diagnosis not present

## 2019-05-11 DIAGNOSIS — E785 Hyperlipidemia, unspecified: Secondary | ICD-10-CM | POA: Diagnosis not present

## 2019-05-11 DIAGNOSIS — E118 Type 2 diabetes mellitus with unspecified complications: Secondary | ICD-10-CM | POA: Diagnosis not present

## 2019-05-18 DIAGNOSIS — E039 Hypothyroidism, unspecified: Secondary | ICD-10-CM | POA: Diagnosis not present

## 2019-05-18 DIAGNOSIS — E118 Type 2 diabetes mellitus with unspecified complications: Secondary | ICD-10-CM | POA: Diagnosis not present

## 2019-05-18 DIAGNOSIS — E785 Hyperlipidemia, unspecified: Secondary | ICD-10-CM | POA: Diagnosis not present

## 2019-05-18 DIAGNOSIS — I10 Essential (primary) hypertension: Secondary | ICD-10-CM | POA: Diagnosis not present

## 2019-05-18 DIAGNOSIS — L309 Dermatitis, unspecified: Secondary | ICD-10-CM | POA: Diagnosis not present

## 2019-07-03 DIAGNOSIS — H26492 Other secondary cataract, left eye: Secondary | ICD-10-CM | POA: Diagnosis not present

## 2019-07-03 DIAGNOSIS — Z961 Presence of intraocular lens: Secondary | ICD-10-CM | POA: Diagnosis not present

## 2019-07-03 DIAGNOSIS — H18413 Arcus senilis, bilateral: Secondary | ICD-10-CM | POA: Diagnosis not present

## 2019-07-03 DIAGNOSIS — E119 Type 2 diabetes mellitus without complications: Secondary | ICD-10-CM | POA: Diagnosis not present

## 2019-11-09 DIAGNOSIS — E118 Type 2 diabetes mellitus with unspecified complications: Secondary | ICD-10-CM | POA: Diagnosis not present

## 2019-11-09 DIAGNOSIS — E039 Hypothyroidism, unspecified: Secondary | ICD-10-CM | POA: Diagnosis not present

## 2019-11-09 DIAGNOSIS — I10 Essential (primary) hypertension: Secondary | ICD-10-CM | POA: Diagnosis not present

## 2019-11-09 DIAGNOSIS — E785 Hyperlipidemia, unspecified: Secondary | ICD-10-CM | POA: Diagnosis not present

## 2019-11-29 DIAGNOSIS — Z0000 Encounter for general adult medical examination without abnormal findings: Secondary | ICD-10-CM | POA: Diagnosis not present

## 2019-11-29 DIAGNOSIS — I10 Essential (primary) hypertension: Secondary | ICD-10-CM | POA: Diagnosis not present

## 2019-12-04 DIAGNOSIS — Z1231 Encounter for screening mammogram for malignant neoplasm of breast: Secondary | ICD-10-CM | POA: Diagnosis not present

## 2020-03-21 DIAGNOSIS — E118 Type 2 diabetes mellitus with unspecified complications: Secondary | ICD-10-CM | POA: Diagnosis not present

## 2020-03-21 DIAGNOSIS — E785 Hyperlipidemia, unspecified: Secondary | ICD-10-CM | POA: Diagnosis not present

## 2020-03-21 DIAGNOSIS — I10 Essential (primary) hypertension: Secondary | ICD-10-CM | POA: Diagnosis not present

## 2020-03-21 DIAGNOSIS — E039 Hypothyroidism, unspecified: Secondary | ICD-10-CM | POA: Diagnosis not present

## 2020-04-18 DIAGNOSIS — R059 Cough, unspecified: Secondary | ICD-10-CM | POA: Diagnosis not present

## 2020-04-18 DIAGNOSIS — R0981 Nasal congestion: Secondary | ICD-10-CM | POA: Diagnosis not present

## 2020-04-18 DIAGNOSIS — Z20822 Contact with and (suspected) exposure to covid-19: Secondary | ICD-10-CM | POA: Diagnosis not present

## 2020-07-22 DIAGNOSIS — E785 Hyperlipidemia, unspecified: Secondary | ICD-10-CM | POA: Diagnosis not present

## 2020-07-22 DIAGNOSIS — E118 Type 2 diabetes mellitus with unspecified complications: Secondary | ICD-10-CM | POA: Diagnosis not present

## 2020-07-22 DIAGNOSIS — E039 Hypothyroidism, unspecified: Secondary | ICD-10-CM | POA: Diagnosis not present

## 2020-07-22 DIAGNOSIS — Z0000 Encounter for general adult medical examination without abnormal findings: Secondary | ICD-10-CM | POA: Diagnosis not present

## 2020-07-22 DIAGNOSIS — I10 Essential (primary) hypertension: Secondary | ICD-10-CM | POA: Diagnosis not present

## 2020-07-29 DIAGNOSIS — I10 Essential (primary) hypertension: Secondary | ICD-10-CM | POA: Diagnosis not present

## 2020-07-29 DIAGNOSIS — K219 Gastro-esophageal reflux disease without esophagitis: Secondary | ICD-10-CM | POA: Diagnosis not present

## 2020-07-29 DIAGNOSIS — E785 Hyperlipidemia, unspecified: Secondary | ICD-10-CM | POA: Diagnosis not present

## 2020-07-29 DIAGNOSIS — E039 Hypothyroidism, unspecified: Secondary | ICD-10-CM | POA: Diagnosis not present

## 2020-07-29 DIAGNOSIS — E118 Type 2 diabetes mellitus with unspecified complications: Secondary | ICD-10-CM | POA: Diagnosis not present

## 2020-07-29 DIAGNOSIS — Z79899 Other long term (current) drug therapy: Secondary | ICD-10-CM | POA: Diagnosis not present

## 2020-09-18 ENCOUNTER — Encounter (HOSPITAL_BASED_OUTPATIENT_CLINIC_OR_DEPARTMENT_OTHER): Payer: Self-pay

## 2020-09-18 ENCOUNTER — Emergency Department (HOSPITAL_BASED_OUTPATIENT_CLINIC_OR_DEPARTMENT_OTHER): Payer: Medicare Other

## 2020-09-18 ENCOUNTER — Other Ambulatory Visit: Payer: Self-pay

## 2020-09-18 ENCOUNTER — Inpatient Hospital Stay (HOSPITAL_BASED_OUTPATIENT_CLINIC_OR_DEPARTMENT_OTHER)
Admission: EM | Admit: 2020-09-18 | Discharge: 2020-09-22 | DRG: 446 | Disposition: A | Discharge status: Home / Self Care | Payer: Medicare Other | Attending: Family Medicine | Admitting: Family Medicine

## 2020-09-18 DIAGNOSIS — K838 Other specified diseases of biliary tract: Secondary | ICD-10-CM | POA: Diagnosis not present

## 2020-09-18 DIAGNOSIS — H9190 Unspecified hearing loss, unspecified ear: Secondary | ICD-10-CM | POA: Diagnosis not present

## 2020-09-18 DIAGNOSIS — I10 Essential (primary) hypertension: Secondary | ICD-10-CM

## 2020-09-18 DIAGNOSIS — K828 Other specified diseases of gallbladder: Secondary | ICD-10-CM | POA: Diagnosis not present

## 2020-09-18 DIAGNOSIS — K219 Gastro-esophageal reflux disease without esophagitis: Secondary | ICD-10-CM | POA: Diagnosis present

## 2020-09-18 DIAGNOSIS — K3189 Other diseases of stomach and duodenum: Secondary | ICD-10-CM | POA: Diagnosis not present

## 2020-09-18 DIAGNOSIS — Z7984 Long term (current) use of oral hypoglycemic drugs: Secondary | ICD-10-CM | POA: Diagnosis not present

## 2020-09-18 DIAGNOSIS — K7689 Other specified diseases of liver: Secondary | ICD-10-CM | POA: Diagnosis not present

## 2020-09-18 DIAGNOSIS — K831 Obstruction of bile duct: Secondary | ICD-10-CM | POA: Diagnosis not present

## 2020-09-18 DIAGNOSIS — E1165 Type 2 diabetes mellitus with hyperglycemia: Secondary | ICD-10-CM | POA: Diagnosis present

## 2020-09-18 DIAGNOSIS — E039 Hypothyroidism, unspecified: Secondary | ICD-10-CM | POA: Diagnosis not present

## 2020-09-18 DIAGNOSIS — K8689 Other specified diseases of pancreas: Secondary | ICD-10-CM | POA: Diagnosis not present

## 2020-09-18 DIAGNOSIS — R5381 Other malaise: Secondary | ICD-10-CM

## 2020-09-18 DIAGNOSIS — Z79890 Hormone replacement therapy (postmenopausal): Secondary | ICD-10-CM

## 2020-09-18 DIAGNOSIS — R079 Chest pain, unspecified: Secondary | ICD-10-CM | POA: Diagnosis not present

## 2020-09-18 DIAGNOSIS — Z79899 Other long term (current) drug therapy: Secondary | ICD-10-CM

## 2020-09-18 DIAGNOSIS — R933 Abnormal findings on diagnostic imaging of other parts of digestive tract: Secondary | ICD-10-CM | POA: Diagnosis not present

## 2020-09-18 DIAGNOSIS — I700 Atherosclerosis of aorta: Secondary | ICD-10-CM | POA: Diagnosis not present

## 2020-09-18 DIAGNOSIS — R935 Abnormal findings on diagnostic imaging of other abdominal regions, including retroperitoneum: Secondary | ICD-10-CM | POA: Diagnosis not present

## 2020-09-18 DIAGNOSIS — E119 Type 2 diabetes mellitus without complications: Secondary | ICD-10-CM

## 2020-09-18 DIAGNOSIS — R17 Unspecified jaundice: Secondary | ICD-10-CM | POA: Diagnosis not present

## 2020-09-18 DIAGNOSIS — R634 Abnormal weight loss: Secondary | ICD-10-CM | POA: Diagnosis not present

## 2020-09-18 DIAGNOSIS — Z20822 Contact with and (suspected) exposure to covid-19: Secondary | ICD-10-CM | POA: Diagnosis present

## 2020-09-18 DIAGNOSIS — Z48815 Encounter for surgical aftercare following surgery on the digestive system: Secondary | ICD-10-CM | POA: Diagnosis not present

## 2020-09-18 DIAGNOSIS — Z9889 Other specified postprocedural states: Secondary | ICD-10-CM

## 2020-09-18 DIAGNOSIS — K862 Cyst of pancreas: Secondary | ICD-10-CM | POA: Diagnosis not present

## 2020-09-18 DIAGNOSIS — C259 Malignant neoplasm of pancreas, unspecified: Secondary | ICD-10-CM | POA: Diagnosis not present

## 2020-09-18 DIAGNOSIS — R945 Abnormal results of liver function studies: Secondary | ICD-10-CM | POA: Diagnosis not present

## 2020-09-18 HISTORY — DX: Type 2 diabetes mellitus without complications: E11.9

## 2020-09-18 LAB — COMPREHENSIVE METABOLIC PANEL
ALT: 226 U/L — ABNORMAL HIGH (ref 0–44)
AST: 234 U/L — ABNORMAL HIGH (ref 15–41)
Albumin: 4.3 g/dL (ref 3.5–5.0)
Alkaline Phosphatase: 918 U/L — ABNORMAL HIGH (ref 38–126)
Anion gap: 11 (ref 5–15)
BUN: 11 mg/dL (ref 8–23)
CO2: 26 mmol/L (ref 22–32)
Calcium: 9.8 mg/dL (ref 8.9–10.3)
Chloride: 97 mmol/L — ABNORMAL LOW (ref 98–111)
Creatinine, Ser: 0.96 mg/dL (ref 0.44–1.00)
GFR, Estimated: >60 mL/min (ref >60)
Glucose, Bld: 238 mg/dL — ABNORMAL HIGH (ref 70–99)
Potassium: 3.9 mmol/L (ref 3.5–5.1)
Sodium: 134 mmol/L — ABNORMAL LOW (ref 135–145)
Total Bilirubin: 17.4 mg/dL — ABNORMAL HIGH (ref 0.3–1.2)
Total Protein: 6.8 g/dL (ref 6.5–8.1)

## 2020-09-18 LAB — URINALYSIS, ROUTINE W REFLEX MICROSCOPIC
Glucose, UA: NEGATIVE mg/dL
Hgb urine dipstick: NEGATIVE
Ketones, ur: NEGATIVE mg/dL
Nitrite: NEGATIVE
Protein, ur: NEGATIVE mg/dL
Specific Gravity, Urine: <1.005 — ABNORMAL LOW (ref 1.005–1.030)
pH: 6 (ref 5.0–8.0)

## 2020-09-18 LAB — CBC WITH DIFFERENTIAL/PLATELET
Abs Immature Granulocytes: 0.03 10*3/uL (ref 0.00–0.07)
Basophils Absolute: 0 10*3/uL (ref 0.0–0.1)
Basophils Relative: 1 %
Eosinophils Absolute: 0.2 10*3/uL (ref 0.0–0.5)
Eosinophils Relative: 5 %
HCT: 36.9 % (ref 36.0–46.0)
Hemoglobin: 12.4 g/dL (ref 12.0–15.0)
Immature Granulocytes: 1 %
Lymphocytes Relative: 33 %
Lymphs Abs: 1.4 10*3/uL (ref 0.7–4.0)
MCH: 28.4 pg (ref 26.0–34.0)
MCHC: 33.6 g/dL (ref 30.0–36.0)
MCV: 84.4 fL (ref 80.0–100.0)
Monocytes Absolute: 0.4 10*3/uL (ref 0.1–1.0)
Monocytes Relative: 9 %
Neutro Abs: 2.2 10*3/uL (ref 1.7–7.7)
Neutrophils Relative %: 51 %
Platelets: 226 10*3/uL (ref 150–400)
RBC: 4.37 MIL/uL (ref 3.87–5.11)
RDW: 16.3 % — ABNORMAL HIGH (ref 11.5–15.5)
WBC: 4.3 10*3/uL (ref 4.0–10.5)
nRBC: 0 % (ref 0.0–0.2)

## 2020-09-18 LAB — RESP PANEL BY RT-PCR (FLU A&B, COVID) ARPGX2
Influenza A by PCR: NEGATIVE
Influenza B by PCR: NEGATIVE
SARS Coronavirus 2 by RT PCR: NEGATIVE

## 2020-09-18 LAB — AMMONIA: Ammonia: 55 umol/L — ABNORMAL HIGH (ref 9–35)

## 2020-09-18 LAB — CBG MONITORING, ED: Glucose-Capillary: 240 mg/dL — ABNORMAL HIGH (ref 70–99)

## 2020-09-18 LAB — APTT: aPTT: 38 seconds — ABNORMAL HIGH (ref 24–36)

## 2020-09-18 LAB — PROTIME-INR
INR: 0.9 (ref 0.8–1.2)
Prothrombin Time: 12.6 seconds (ref 11.4–15.2)

## 2020-09-18 LAB — LIPASE, BLOOD: Lipase: 127 U/L — ABNORMAL HIGH (ref 11–51)

## 2020-09-18 MED ORDER — SODIUM CHLORIDE 0.9 % IV BOLUS
500.0000 mL | Freq: Once | INTRAVENOUS | Status: AC
  Administered 2020-09-18: 500 mL via INTRAVENOUS

## 2020-09-18 MED ORDER — SODIUM CHLORIDE 0.9 % IV SOLN: INTRAVENOUS | Status: DC

## 2020-09-18 NOTE — ED Triage Notes (Signed)
Pt c/o hyperglycemia pt bgl upon arrival was 240. Pt daughter reports pt has been a little "lethargic today". Pt takes metformin for diabetes.

## 2020-09-18 NOTE — ED Provider Notes (Signed)
[facility] EMERGENCY DEPT Provider Note   CSN: [id number] Arrival date & time: 09/18/20  1900     History Chief Complaint  Patient presents with  . Hyperglycemia    Tina Patel is a 80 y.o. female.  HPI   Patient presented to the ED for evaluation of high blood sugar.  Patient states she has a history of diabetes.  Last time she saw her doctor the hemoglobin A1c was elevated at 9.  They decided to up her metformin.  That was several months ago.  More recently her sugars have been up into the 200s but today they were into the 300s.  Patient has been having some intermittent abdominal discomfort.  Her daughter also noticed that her eyes looked more yellow than usual.  They called the doctor who suggested she come to the ED to get some blood tests and urine test.  Patient denies any vomiting.  No fevers.  Past Medical History:  Diagnosis Date  . Diabetes ([place])   . Diabetes mellitus   . High cholesterol   . Hypothyroid     There are no problems to display for this patient.   Past Surgical History:  Procedure Laterality Date  . FINGER SURGERY       OB History   No obstetric history on file.     No family history on file.  Social History   Tobacco Use  . Smoking status: Never Smoker  Substance Use Topics  . Alcohol use: No  . Drug use: No    Home Medications Prior to Admission medications   Medication Sig Start Date End Date Taking? Authorizing Provider  levothyroxine (SYNTHROID, LEVOTHROID) 88 MCG tablet Take 88 mcg by mouth daily.    [provider]  metFORMIN (GLUCOPHAGE) 500 MG tablet Take 500 mg by mouth once.    [provider]  rosuvastatin (CRESTOR) 20 MG tablet Take 20 mg by mouth daily.    [provider]    Allergies    Patient has no known allergies.  Review of Systems   Review of Systems  All other systems reviewed and are negative.   Physical Exam Updated Vital Signs BP 131/70 (BP Location: Right  Arm)   Pulse (!) 59   Temp 97.9 F (36.6 C) (Oral)   Resp 18   Ht 1.702 m (5' 7" )   Wt 62.6 kg   SpO2 100%   BMI 21.61 kg/m   Physical Exam Vitals and nursing note reviewed.  Constitutional:      General: She is not in acute distress.    Appearance: She is well-developed.  HENT:     Head: Normocephalic and atraumatic.     Right Ear: External ear normal.     Left Ear: External ear normal.  Eyes:     General: Scleral icterus present.        Right eye: No discharge.        Left eye: No discharge.     Conjunctiva/sclera: Conjunctivae normal.  Neck:     Trachea: No tracheal deviation.  Cardiovascular:     Rate and Rhythm: Normal rate and regular rhythm.  Pulmonary:     Effort: Pulmonary effort is normal. No respiratory distress.     Breath sounds: Normal breath sounds. No stridor. No wheezing or rales.  Abdominal:     General: Bowel sounds are normal. There is no distension.     Palpations: Abdomen is soft.     Tenderness: There is no  abdominal tenderness. There is no guarding or rebound.  Musculoskeletal:        General: No tenderness.     Cervical back: Neck supple.  Skin:    General: Skin is warm and dry.     Findings: No rash.  Neurological:     Mental Status: She is alert.     Cranial Nerves: No cranial nerve deficit (no facial droop, extraocular movements intact, no slurred speech).     Sensory: No sensory deficit.     Motor: No abnormal muscle tone or seizure activity.     Coordination: Coordination normal.     ED Results / Procedures / Treatments   Labs (all labs ordered are listed, but only abnormal results are displayed) Labs notable for elevated lfts, bilirubin  EKG None  Radiology DG Chest Portable 1 View  Result Date: 09/18/2020 CLINICAL DATA:  Chest pain EXAM: PORTABLE CHEST 1 VIEW COMPARISON:  None. FINDINGS: The heart size and mediastinal contours are within normal limits. Aortic atherosclerosis. Both lungs are clear. The visualized skeletal  structures are unremarkable. IMPRESSION: No active disease. Electronically Signed   By: Donavan Foil M.D.   On: 09/18/2020 19:52    US Abdomen Limited RUQ (LIVER/GB)  Result Date: 09/18/2020 CLINICAL DATA:  Jaundice EXAM: ULTRASOUND ABDOMEN LIMITED RIGHT UPPER QUADRANT COMPARISON:  None. FINDINGS: Gallbladder: Sludge seen within the gallbladder. Gallbladder wall is thickened at 5 mm. Small amount of pericholecystic fluid. Common bile duct: Diameter: Dilated measuring up to 13 mm. Liver: Intrahepatic biliary ductal dilatation. Multiple cysts throughout the liver measuring up to 4.6 cm. Complex area posteriorly in the right hepatic lobe measures up to 3.6 cm which could reflect a complex septated cyst. Portal vein is patent on color Doppler imaging with normal direction of blood flow towards the liver. Other: None. IMPRESSION: Intrahepatic and extrahepatic biliary ductal dilatation. Common bile duct dilated up to 13 mm. This could be further evaluated with MRCP or ERCP. Sludge within the gallbladder with associated gallbladder wall thickening and pericholecystic fluid. Cannot exclude acute cholecystitis. Multiple hepatic cysts. Complex cystic area posteriorly in the right hepatic lobe. This could also be further evaluated with MRI. Electronically Signed   By: Rolm Baptise M.D.   On: 09/18/2020 21:52    Procedures .Critical Care Performed by: Dorie Rank, MD Authorized by: Dorie Rank, MD   Critical care provider statement:    Critical care time (minutes):  30   Critical care was time spent personally by me on the following activities:  Discussions with consultants, evaluation of patient's response to treatment, examination of patient, ordering and performing treatments and interventions, ordering and review of laboratory studies, ordering and review of radiographic studies, pulse oximetry, re-evaluation of patient's condition, obtaining history from patient or surrogate and review of old charts      Medications Ordered in ED Medications  sodium chloride 0.9 % bolus 500 mL (has no administration in time range)    And  0.9 %  sodium chloride infusion (has no administration in time range)    ED Course  I have reviewed the triage vital signs and the nursing notes.  Pertinent labs & imaging results that were available during my care of the patient were reviewed by me and considered in my medical decision making (see chart for details).  Clinical Course as of 09/20/20 0900  Thu Sep 18, 2020  2022 Lipase elevated at 127.  Ammonia elevated 55 [JK]  2023 AST alk phos and bilirubin elevated with a total  bilirubin of 17.4 [JK]  2023 CBC normal [JK]  2202 Intrahepatic and extrahepatic biliary duct dilatation noted [JK]    Clinical Course User Index [JK] Dorie Rank, MD   MDM Rules/Calculators/A&P                          Pt presented with concerns of hyperglycemia.  Family also recently noticed change in pt's eye color.  Pt in retrospect has been having abdominal discomfort the last few months.  Mild hyperglycemia noted however labs show severe elevation in bilirubin and lfts.  Ammonia elevated but clinically pt is alert, oriented, doubt hepatic encephalopathy.  US shows intra an extra hepatic ductal dilation.  Presentation concerning for malignancy, obstructing lesion.  Pt will need further evaluation including, MRCP, GI consult.  Pt transferred/admitted for further workup.  Findings discussed with patient and family. Final Clinical Impression(s) / ED Diagnoses Final diagnoses:  Jaundice     Dorie Rank, MD 09/20/20 [phone]

## 2020-09-19 ENCOUNTER — Inpatient Hospital Stay (HOSPITAL_COMMUNITY): Payer: Medicare Other

## 2020-09-19 ENCOUNTER — Inpatient Hospital Stay (HOSPITAL_COMMUNITY): Payer: Medicare Other | Admitting: Certified Registered Nurse Anesthetist

## 2020-09-19 ENCOUNTER — Encounter (HOSPITAL_COMMUNITY): Payer: Self-pay | Admitting: Family Medicine

## 2020-09-19 ENCOUNTER — Encounter (HOSPITAL_COMMUNITY)
Admission: EM | Disposition: A | Discharge status: Home / Self Care | Payer: Self-pay | Source: Home / Self Care | Attending: Family Medicine

## 2020-09-19 DIAGNOSIS — R945 Abnormal results of liver function studies: Secondary | ICD-10-CM | POA: Diagnosis not present

## 2020-09-19 DIAGNOSIS — Z20822 Contact with and (suspected) exposure to covid-19: Secondary | ICD-10-CM | POA: Diagnosis not present

## 2020-09-19 DIAGNOSIS — Z79890 Hormone replacement therapy (postmenopausal): Secondary | ICD-10-CM | POA: Diagnosis not present

## 2020-09-19 DIAGNOSIS — Z7984 Long term (current) use of oral hypoglycemic drugs: Secondary | ICD-10-CM | POA: Diagnosis not present

## 2020-09-19 DIAGNOSIS — Z79899 Other long term (current) drug therapy: Secondary | ICD-10-CM | POA: Diagnosis not present

## 2020-09-19 DIAGNOSIS — E1165 Type 2 diabetes mellitus with hyperglycemia: Secondary | ICD-10-CM | POA: Diagnosis not present

## 2020-09-19 DIAGNOSIS — I10 Essential (primary) hypertension: Secondary | ICD-10-CM | POA: Diagnosis not present

## 2020-09-19 DIAGNOSIS — E039 Hypothyroidism, unspecified: Secondary | ICD-10-CM | POA: Diagnosis not present

## 2020-09-19 DIAGNOSIS — K838 Other specified diseases of biliary tract: Principal | ICD-10-CM

## 2020-09-19 DIAGNOSIS — E119 Type 2 diabetes mellitus without complications: Secondary | ICD-10-CM | POA: Diagnosis not present

## 2020-09-19 DIAGNOSIS — R933 Abnormal findings on diagnostic imaging of other parts of digestive tract: Secondary | ICD-10-CM | POA: Diagnosis not present

## 2020-09-19 DIAGNOSIS — R634 Abnormal weight loss: Secondary | ICD-10-CM | POA: Diagnosis not present

## 2020-09-19 DIAGNOSIS — K831 Obstruction of bile duct: Secondary | ICD-10-CM | POA: Diagnosis not present

## 2020-09-19 DIAGNOSIS — K219 Gastro-esophageal reflux disease without esophagitis: Secondary | ICD-10-CM | POA: Diagnosis not present

## 2020-09-19 DIAGNOSIS — H9190 Unspecified hearing loss, unspecified ear: Secondary | ICD-10-CM | POA: Diagnosis not present

## 2020-09-19 HISTORY — PX: BILIARY STENT PLACEMENT: SHX5538

## 2020-09-19 HISTORY — PX: SPHINCTEROTOMY: SHX5279

## 2020-09-19 HISTORY — PX: FINE NEEDLE ASPIRATION: SHX6590

## 2020-09-19 HISTORY — PX: ESOPHAGOGASTRODUODENOSCOPY (EGD) WITH PROPOFOL: SHX5813

## 2020-09-19 HISTORY — PX: BILIARY BRUSHING: SHX6843

## 2020-09-19 HISTORY — PX: EUS: SHX5427

## 2020-09-19 HISTORY — PX: ERCP: SHX5425

## 2020-09-19 LAB — COMPREHENSIVE METABOLIC PANEL
ALT: 200 U/L — ABNORMAL HIGH (ref 0–44)
AST: 225 U/L — ABNORMAL HIGH (ref 15–41)
Albumin: 3.2 g/dL — ABNORMAL LOW (ref 3.5–5.0)
Alkaline Phosphatase: 797 U/L — ABNORMAL HIGH (ref 38–126)
Anion gap: 9 (ref 5–15)
BUN: 9 mg/dL (ref 8–23)
CO2: 21 mmol/L — ABNORMAL LOW (ref 22–32)
Calcium: 8.9 mg/dL (ref 8.9–10.3)
Chloride: 107 mmol/L (ref 98–111)
Creatinine, Ser: 0.48 mg/dL (ref 0.44–1.00)
GFR, Estimated: >60 mL/min (ref >60)
Glucose, Bld: 191 mg/dL — ABNORMAL HIGH (ref 70–99)
Potassium: 3.7 mmol/L (ref 3.5–5.1)
Sodium: 137 mmol/L (ref 135–145)
Total Bilirubin: 14.5 mg/dL — ABNORMAL HIGH (ref 0.3–1.2)
Total Protein: 6 g/dL — ABNORMAL LOW (ref 6.5–8.1)

## 2020-09-19 LAB — GLUCOSE, CAPILLARY
Glucose-Capillary: 188 mg/dL — ABNORMAL HIGH (ref 70–99)
Glucose-Capillary: 130 mg/dL — ABNORMAL HIGH (ref 70–99)
Glucose-Capillary: 178 mg/dL — ABNORMAL HIGH (ref 70–99)
Glucose-Capillary: 291 mg/dL — ABNORMAL HIGH (ref 70–99)

## 2020-09-19 SURGERY — ERCP, WITH INTERVENTION IF INDICATED
Anesthesia: General

## 2020-09-19 MED ORDER — CIPROFLOXACIN IN D5W 400 MG/200ML IV SOLN
INTRAVENOUS | Status: AC
  Filled 2020-09-19: qty 200

## 2020-09-19 MED ORDER — ONDANSETRON HCL 4 MG/2ML IJ SOLN
INTRAMUSCULAR | Status: DC | PRN
  Administered 2020-09-19: 4 mg via INTRAVENOUS

## 2020-09-19 MED ORDER — FAMOTIDINE 20 MG PO TABS
20.0000 mg | ORAL_TABLET | Freq: Every day | ORAL | Status: DC
  Administered 2020-09-19 – 2020-09-21 (×3): 20 mg via ORAL
  Filled 2020-09-19 (×3): qty 1

## 2020-09-19 MED ORDER — INDOMETHACIN 50 MG RE SUPP
RECTAL | Status: AC
  Filled 2020-09-19: qty 2

## 2020-09-19 MED ORDER — SUCCINYLCHOLINE CHLORIDE 200 MG/10ML IV SOSY
PREFILLED_SYRINGE | INTRAVENOUS | Status: DC | PRN
  Administered 2020-09-19: 100 mg via INTRAVENOUS

## 2020-09-19 MED ORDER — FUROSEMIDE 20 MG PO TABS
20.0000 mg | ORAL_TABLET | Freq: Every day | ORAL | Status: DC
  Administered 2020-09-20 – 2020-09-22 (×3): 20 mg via ORAL
  Filled 2020-09-19 (×3): qty 1

## 2020-09-19 MED ORDER — AMLODIPINE BESYLATE 5 MG PO TABS
5.0000 mg | ORAL_TABLET | Freq: Every day | ORAL | Status: DC
  Administered 2020-09-20 – 2020-09-21 (×2): 5 mg via ORAL
  Filled 2020-09-19 (×3): qty 1

## 2020-09-19 MED ORDER — LEVOTHYROXINE SODIUM 88 MCG PO TABS
88.0000 ug | ORAL_TABLET | Freq: Every day | ORAL | Status: DC
  Administered 2020-09-19 – 2020-09-22 (×4): 88 ug via ORAL
  Filled 2020-09-19 (×4): qty 1

## 2020-09-19 MED ORDER — GLUCAGON HCL RDNA (DIAGNOSTIC) 1 MG IJ SOLR
INTRAMUSCULAR | Status: AC
  Filled 2020-09-19: qty 1

## 2020-09-19 MED ORDER — DEXAMETHASONE SODIUM PHOSPHATE 10 MG/ML IJ SOLN
INTRAMUSCULAR | Status: DC | PRN
  Administered 2020-09-19: 50 mg via INTRAVENOUS
  Administered 2020-09-19: 10 mg via INTRAVENOUS

## 2020-09-19 MED ORDER — LACTATED RINGERS IV SOLN
INTRAVENOUS | Status: AC | PRN
  Administered 2020-09-19: 10 mL/h via INTRAVENOUS

## 2020-09-19 MED ORDER — FENTANYL CITRATE (PF) 100 MCG/2ML IJ SOLN
INTRAMUSCULAR | Status: AC
  Filled 2020-09-19: qty 2

## 2020-09-19 MED ORDER — PHENYLEPHRINE 40 MCG/ML (10ML) SYRINGE FOR IV PUSH (FOR BLOOD PRESSURE SUPPORT)
PREFILLED_SYRINGE | INTRAVENOUS | Status: DC | PRN
  Administered 2020-09-19: 80 ug via INTRAVENOUS
  Administered 2020-09-19: 120 ug via INTRAVENOUS
  Administered 2020-09-19: 80 ug via INTRAVENOUS
  Administered 2020-09-19: 120 ug via INTRAVENOUS
  Administered 2020-09-19: 80 ug via INTRAVENOUS

## 2020-09-19 MED ORDER — CIPROFLOXACIN IN D5W 400 MG/200ML IV SOLN
INTRAVENOUS | Status: DC | PRN
  Administered 2020-09-19: 400 mg via INTRAVENOUS

## 2020-09-19 MED ORDER — PROPOFOL 500 MG/50ML IV EMUL
INTRAVENOUS | Status: AC
  Filled 2020-09-19: qty 200

## 2020-09-19 MED ORDER — PROPOFOL 10 MG/ML IV BOLUS
INTRAVENOUS | Status: DC | PRN
  Administered 2020-09-19: 170 mg via INTRAVENOUS

## 2020-09-19 MED ORDER — SODIUM CHLORIDE 0.9 % IV SOLN
INTRAVENOUS | Status: DC | PRN
  Administered 2020-09-19: 25 mL

## 2020-09-19 MED ORDER — INSULIN ASPART 100 UNIT/ML IJ SOLN
0.0000 [IU] | Freq: Three times a day (TID) | INTRAMUSCULAR | Status: DC
  Administered 2020-09-19 (×2): 2 [IU] via SUBCUTANEOUS
  Administered 2020-09-19: 5 [IU] via SUBCUTANEOUS
  Administered 2020-09-20: 2 [IU] via SUBCUTANEOUS
  Administered 2020-09-20: 5 [IU] via SUBCUTANEOUS
  Administered 2020-09-20: 2 [IU] via SUBCUTANEOUS
  Administered 2020-09-20: 3 [IU] via SUBCUTANEOUS
  Administered 2020-09-20 – 2020-09-21 (×2): 5 [IU] via SUBCUTANEOUS
  Administered 2020-09-21: 9 [IU] via SUBCUTANEOUS
  Administered 2020-09-21 – 2020-09-22 (×2): 2 [IU] via SUBCUTANEOUS
  Administered 2020-09-22: 5 [IU] via SUBCUTANEOUS
  Administered 2020-09-22: 2 [IU] via SUBCUTANEOUS

## 2020-09-19 MED ORDER — PROPOFOL 1000 MG/100ML IV EMUL
INTRAVENOUS | Status: AC
  Filled 2020-09-19: qty 600

## 2020-09-19 MED ORDER — SODIUM CHLORIDE 0.9 % IV SOLN: INTRAVENOUS | Status: AC

## 2020-09-19 MED ORDER — FENTANYL CITRATE (PF) 100 MCG/2ML IJ SOLN
INTRAMUSCULAR | Status: DC | PRN
  Administered 2020-09-19 (×2): 50 ug via INTRAVENOUS

## 2020-09-19 MED ORDER — PHENYLEPHRINE HCL-NACL 10-0.9 MG/250ML-% IV SOLN
INTRAVENOUS | Status: DC | PRN
  Administered 2020-09-19: 50 ug/min via INTRAVENOUS

## 2020-09-19 MED ORDER — LISINOPRIL 10 MG PO TABS
10.0000 mg | ORAL_TABLET | Freq: Every day | ORAL | Status: DC
  Administered 2020-09-20 – 2020-09-21 (×2): 10 mg via ORAL
  Filled 2020-09-19 (×3): qty 1

## 2020-09-19 MED ORDER — LIDOCAINE 2% (20 MG/ML) 5 ML SYRINGE
INTRAMUSCULAR | Status: DC | PRN
  Administered 2020-09-19: 20 mg via INTRAVENOUS

## 2020-09-19 NOTE — Anesthesia Preprocedure Evaluation (Addendum)
Anesthesia Evaluation  Patient identified by MRN, date of birth, ID band Patient awake    Reviewed: Allergy & Precautions, NPO status , Patient's Chart, lab work & pertinent test results  Airway Mallampati: II       Dental  (+) Dental Advisory Given   Pulmonary neg pulmonary ROS,    breath sounds clear to auscultation       Cardiovascular hypertension, Pt. on medications  Rhythm:Regular Rate:Normal     Neuro/Psych negative neurological ROS     GI/Hepatic negative GI ROS, Neg liver ROS,   Endo/Other  diabetes, Type 2, Oral Hypoglycemic AgentsHypothyroidism   Renal/GU negative Renal ROS     Musculoskeletal   Abdominal   Peds  Hematology negative hematology ROS (+)   Anesthesia Other Findings   Reproductive/Obstetrics                            Anesthesia Physical Anesthesia Plan  ASA: II  Anesthesia Plan: General   Post-op Pain Management:    Induction: Intravenous  PONV Risk Score and Plan: 3 and Dexamethasone, Ondansetron and Treatment may vary due to age or medical condition  Airway Management Planned: Oral ETT  Additional Equipment:   Intra-op Plan:   Post-operative Plan: Extubation in OR  Informed Consent: I have reviewed the patients History and Physical, chart, labs and discussed the procedure including the risks, benefits and alternatives for the proposed anesthesia with the patient or authorized representative who has indicated his/her understanding and acceptance.     Dental advisory given  Plan Discussed with: CRNA  Anesthesia Plan Comments:         Anesthesia Quick Evaluation

## 2020-09-19 NOTE — H&P (View-Only) (Signed)
Reason for Consult: Dilated CBD and hyperbilirubinemia Referring Physician: [facility]  [name] HPI: This is a 80 year old female with a PMH of HTN, hypothyroidism, DM, and GERD admitted for findings of CBD dilation and hyperbilirubinemia.  The patient presented to the ER with complaints of fatigue and RUQ pain.  Since April of this year she reports a 20-30 lbs weight loss as well a worsening of her diabetic control.  Her blood glucose was not able to be controlled with metformin.  It was also at this time that she started to notice "orange" urine.  Her daughter reported that she was struggling mentally with the hyperglycemia in that she was more confused.  Blood work in the ER showed the following:  AST 234, ALT 226, AP 918, and TB 17.4.  Her WBC was normal at 4.3 was well as her HGB at 12.4 g/dL.  A RUQ U/S showed a dilated CBD at 13 mm as well as intrahepatic biliary ductal dilation.  An MRCP was recommended by Radiology  Past Medical History:  Diagnosis Date  . Diabetes ([place])   . Diabetes mellitus   . High cholesterol   . Hypothyroid     Past Surgical History:  Procedure Laterality Date  . FINGER SURGERY      No family history on file.  Social History:  reports that she has never smoked. She does not have any smokeless tobacco history on file. She reports that she does not drink alcohol and does not use drugs.  Allergies: No Known Allergies  Medications:  Scheduled: . insulin aspart  0-9 Units Subcutaneous TID PC,HS,0200  . levothyroxine  88 mcg Oral Q0600   Continuous: . sodium chloride 125 mL/hr at 09/19/20 0209    Results for orders placed or performed during the hospital encounter of 09/18/20 (from the past 24 hour(s))  CBG monitoring, ED     Status: Abnormal   Collection Time: 09/18/20  7:11 PM  Result Value Ref Range   Glucose-Capillary 240 (H) 70 - 99 mg/dL  Comprehensive metabolic panel     Status: Abnormal   Collection Time: 09/18/20  7:29 PM   Result Value Ref Range   Sodium 134 (L) 135 - 145 mmol/L   Potassium 3.9 3.5 - 5.1 mmol/L   Chloride 97 (L) 98 - 111 mmol/L   CO2 26 22 - 32 mmol/L   Glucose, Bld 238 (H) 70 - 99 mg/dL   BUN 11 8 - 23 mg/dL   Creatinine, Ser 0.96 0.44 - 1.00 mg/dL   Calcium 9.8 8.9 - 10.3 mg/dL   Total Protein 6.8 6.5 - 8.1 g/dL   Albumin 4.3 3.5 - 5.0 g/dL   AST 234 (H) 15 - 41 U/L   ALT 226 (H) 0 - 44 U/L   Alkaline Phosphatase 918 (H) 38 - 126 U/L   Total Bilirubin 17.4 (H) 0.3 - 1.2 mg/dL   GFR, Estimated >60 >60 mL/min   Anion gap 11 5 - 15  Lipase, blood     Status: Abnormal   Collection Time: 09/18/20  7:29 PM  Result Value Ref Range   Lipase 127 (H) 11 - 51 U/L  CBC with Diff     Status: Abnormal   Collection Time: 09/18/20  7:29 PM  Result Value Ref Range   WBC 4.3 4.0 - 10.5 K/uL   RBC 4.37 3.87 - 5.11 MIL/uL   Hemoglobin 12.4 12.0 - 15.0 g/dL   HCT 36.9 36.0 - 46.0 %  MCV 84.4 80.0 - 100.0 fL   MCH 28.4 26.0 - 34.0 pg   MCHC 33.6 30.0 - 36.0 g/dL   RDW 16.3 (H) 11.5 - 15.5 %   Platelets 226 150 - 400 K/uL   nRBC 0.0 0.0 - 0.2 %   Neutrophils Relative % 51 %   Neutro Abs 2.2 1.7 - 7.7 K/uL   Lymphocytes Relative 33 %   Lymphs Abs 1.4 0.7 - 4.0 K/uL   Monocytes Relative 9 %   Monocytes Absolute 0.4 0.1 - 1.0 K/uL   Eosinophils Relative 5 %   Eosinophils Absolute 0.2 0.0 - 0.5 K/uL   Basophils Relative 1 %   Basophils Absolute 0.0 0.0 - 0.1 K/uL   Immature Granulocytes 1 %   Abs Immature Granulocytes 0.03 0.00 - 0.07 K/uL  Urinalysis, Routine w reflex microscopic Urine, Clean Catch     Status: Abnormal   Collection Time: 09/18/20  7:29 PM  Result Value Ref Range   Color, Urine YELLOW YELLOW   APPearance CLEAR CLEAR   Specific Gravity, Urine <1.005 (L) 1.005 - 1.030   pH 6.0 5.0 - 8.0   Glucose, UA NEGATIVE NEGATIVE mg/dL   Hgb urine dipstick NEGATIVE NEGATIVE   Bilirubin Urine SMALL (A) NEGATIVE   Ketones, ur NEGATIVE NEGATIVE mg/dL   Protein, ur NEGATIVE NEGATIVE  mg/dL   Nitrite NEGATIVE NEGATIVE   Leukocytes,Ua TRACE (A) NEGATIVE   RBC / HPF 0-5 0 - 5 RBC/hpf   WBC, UA 0-5 0 - 5 WBC/hpf   Squamous Epithelial / LPF 0-5 0 - 5  Ammonia     Status: Abnormal   Collection Time: 09/18/20  7:29 PM  Result Value Ref Range   Ammonia 55 (H) 9 - 35 umol/L  Protime-INR     Status: None   Collection Time: 09/18/20  7:29 PM  Result Value Ref Range   Prothrombin Time 12.6 11.4 - 15.2 seconds   INR 0.9 0.8 - 1.2  APTT     Status: Abnormal   Collection Time: 09/18/20  7:29 PM  Result Value Ref Range   aPTT 38 (H) 24 - 36 seconds  Resp Panel by RT-PCR (Flu A&B, Covid) Nasopharyngeal Swab     Status: None   Collection Time: 09/18/20 10:31 PM   Specimen: Nasopharyngeal Swab; Nasopharyngeal(NP) swabs in vial transport medium  Result Value Ref Range   SARS Coronavirus 2 by RT PCR NEGATIVE NEGATIVE   Influenza A by PCR NEGATIVE NEGATIVE   Influenza B by PCR NEGATIVE NEGATIVE  Comprehensive metabolic panel     Status: Abnormal   Collection Time: 09/19/20  1:28 AM  Result Value Ref Range   Sodium 137 135 - 145 mmol/L   Potassium 3.7 3.5 - 5.1 mmol/L   Chloride 107 98 - 111 mmol/L   CO2 21 (L) 22 - 32 mmol/L   Glucose, Bld 191 (H) 70 - 99 mg/dL   BUN 9 8 - 23 mg/dL   Creatinine, Ser 0.48 0.44 - 1.00 mg/dL   Calcium 8.9 8.9 - 10.3 mg/dL   Total Protein 6.0 (L) 6.5 - 8.1 g/dL   Albumin 3.2 (L) 3.5 - 5.0 g/dL   AST 225 (H) 15 - 41 U/L   ALT 200 (H) 0 - 44 U/L   Alkaline Phosphatase 797 (H) 38 - 126 U/L   Total Bilirubin 14.5 (H) 0.3 - 1.2 mg/dL   GFR, Estimated >60 >60 mL/min   Anion gap 9 5 - 15  Glucose, capillary  Status: Abnormal   Collection Time: 09/19/20  7:41 AM  Result Value Ref Range   Glucose-Capillary 188 (H) 70 - 99 mg/dL     DG Chest Portable 1 View  Result Date: 09/18/2020 CLINICAL DATA:  Chest pain EXAM: PORTABLE CHEST 1 VIEW COMPARISON:  None. FINDINGS: The heart size and mediastinal contours are within normal limits. Aortic  atherosclerosis. Both lungs are clear. The visualized skeletal structures are unremarkable. IMPRESSION: No active disease. Electronically Signed   By: Donavan Foil M.D.   On: 09/18/2020 19:52   US Abdomen Limited RUQ (LIVER/GB)  Result Date: 09/18/2020 CLINICAL DATA:  Jaundice EXAM: ULTRASOUND ABDOMEN LIMITED RIGHT UPPER QUADRANT COMPARISON:  None. FINDINGS: Gallbladder: Sludge seen within the gallbladder. Gallbladder wall is thickened at 5 mm. Small amount of pericholecystic fluid. Common bile duct: Diameter: Dilated measuring up to 13 mm. Liver: Intrahepatic biliary ductal dilatation. Multiple cysts throughout the liver measuring up to 4.6 cm. Complex area posteriorly in the right hepatic lobe measures up to 3.6 cm which could reflect a complex septated cyst. Portal vein is patent on color Doppler imaging with normal direction of blood flow towards the liver. Other: None. IMPRESSION: Intrahepatic and extrahepatic biliary ductal dilatation. Common bile duct dilated up to 13 mm. This could be further evaluated with MRCP or ERCP. Sludge within the gallbladder with associated gallbladder wall thickening and pericholecystic fluid. Cannot exclude acute cholecystitis. Multiple hepatic cysts. Complex cystic area posteriorly in the right hepatic lobe. This could also be further evaluated with MRI. Electronically Signed   By: Rolm Baptise M.D.   On: 09/18/2020 21:52    ROS:  As stated above in the HPI otherwise negative.  Blood pressure 128/62, pulse 66, temperature 98.2 F (36.8 C), temperature source Oral, resp. rate 18, height 5\' 7"  (1.702 m), weight 62.6 kg, SpO2 98 %.    PE: Gen: NAD, Alert and Oriented HEENT:  [place]/AT, EOMI Neck: Supple, no LAD Lungs: CTA Bilaterally CV: RRR without M/G/R ABD: Soft, NTND, +BS Ext: No C/C/E  Assessment/Plan: 1) Dilated CBD. 2) Obstructive liver panel pattern. 3) Weight loss. 4) Worsened DM.   The current findings are concerning for a malignant issue,  specifically a pancreatic cancer.  An MRCP is pending, but with the current findings and having availability, an EUS with FNA/ERCP with stent placement will be pursued.  If it is confirmed that she does have a pancreatic cancer staging imaging will be required.  Plan: 1) EUS with FNA/ERCP with stent placement today.  Tina Patel D 09/19/2020, 9:49 AM

## 2020-09-19 NOTE — Progress Notes (Signed)
Pt arrival to the floor via care link. Admit MD aware and noted at the bedside for assessment and evaluation of the patient.   Patient oriented to the room environment. Husband called and updated of patient's status and location. IVF infusing without difficulty.  Patient denies pain at this time. Rounding completed per unit protocol and call light and phone with in reach.

## 2020-09-19 NOTE — Anesthesia Postprocedure Evaluation (Signed)
Anesthesia Post Note  Patient: Tina Patel  Procedure(s) Performed: ENDOSCOPIC RETROGRADE CHOLANGIOPANCREATOGRAPHY (ERCP) (N/A ) UPPER ENDOSCOPIC ULTRASOUND (EUS) LINEAR (N/A ) FINE NEEDLE ASPIRATION BILIARY BRUSHING SPHINCTEROTOMY BILIARY STENT PLACEMENT (N/A )     Patient location during evaluation: PACU Anesthesia Type: General Level of consciousness: sedated and patient cooperative Pain management: pain level controlled Vital Signs Assessment: post-procedure vital signs reviewed and stable Respiratory status: spontaneous breathing Cardiovascular status: stable Anesthetic complications: no   No complications documented.  Last Vitals:  Vitals:   09/19/20 1505 09/19/20 1601  BP: (!) 137/91 (!) 149/80  Pulse: 93 90  Resp: (!) 21 14  Temp:  36.6 C  SpO2: 99% 100%    Last Pain:  Vitals:   09/19/20 1601  TempSrc: Oral  PainSc:                  Nolon Nations

## 2020-09-19 NOTE — Transfer of Care (Signed)
Immediate Anesthesia Transfer of Care Note  Patient: Tina Patel  Procedure(s) Performed: ENDOSCOPIC RETROGRADE CHOLANGIOPANCREATOGRAPHY (ERCP) (N/A ) UPPER ENDOSCOPIC ULTRASOUND (EUS) LINEAR (N/A ) FINE NEEDLE ASPIRATION  Patient Location: PACU and Endoscopy Unit  Anesthesia Type:General  Level of Consciousness: awake, alert  and oriented  Airway & Oxygen Therapy: Patient Spontanous Breathing and Patient connected to face mask  Post-op Assessment: Report given to RN and Post -op Vital signs reviewed and stable  Post vital signs: Reviewed and stable  Last Vitals:  Vitals Value Taken Time  BP    Temp    Pulse 96 09/19/20 1452  Resp 17 09/19/20 1454  SpO2 100 % 09/19/20 1452  Vitals shown include unvalidated device data.  Last Pain:  Vitals:   09/19/20 1232  TempSrc: Oral  PainSc: 0-No pain         Complications: No complications documented.

## 2020-09-19 NOTE — Plan of Care (Signed)

## 2020-09-19 NOTE — H&P (Addendum)
History and Physical    Tina Patel IOE:[id number] DOB: [birth year] DOA: 09/18/2020  PCP: [name], DO  Patient coming from:[facility]  I have personally briefly reviewed patient's old medical records in [place]  Chief Complaint: Hyperbilirubinemia  HPI: Tina Patel is a 80 y.o. female with medical history significant for hypertension, hypothyroidism, GERD and type 2 diabetes who presents as a transfer from [facility] for hyperbilirubinemia and common bile duct dilatation seen and right upper quadrant ultrasound.  Patient reports that for some time she has felt fatigue and for the past 2 months has noted right upper quadrant and mid abdominal pain that is worse at night. Feelings nauseous but no diarrhea.  Notes weight loss of possibly around 20 to 30 pounds since January of this year.  Has also noted that her sugar has been elevated up to 300s which prompted her to seek evaluation today.  She currently is only on metformin.  Denies any tobacco, alcohol or illicit drug use.  No family history of pancreatic cancer.  In the ED, she was afebrile normotensive on room air.  Total bilirubin elevated up to 17.  AST and ALT moderately elevated greater than 200.  Alkaline phosphatase of 900.  Right upper quadrant ultrasound was done instead of contrast study due to contrast shortage.  Showed intrahepatic and extrahepatic biliary duct dilatation.  Patient was then transferred to [name] for hospital admission and GI consultation.   Review of Systems:  Constitutional: No Weight Change, No Fever ENT/Mouth: No sore throat, No Rhinorrhea Eyes: No Eye Pain, No Vision Changes Cardiovascular: No Chest Pain, no SOB Respiratory: No Cough, No Sputum, No Wheezing, no Dyspnea  Gastrointestinal: + Nausea, No Vomiting, No Diarrhea, No Constipation, + Pain Genitourinary: no Urinary Incontinence, No Urgency, No Flank Pain Musculoskeletal: No Arthralgias, No Myalgias Skin: No Skin Lesions,  No Pruritus, Neuro: no Weakness, No Numbness Psych: No Anxiety/Panic, No Depression, no decrease appetite Heme/Lymph: No Bruising, No Bleeding  Past Medical History:  Diagnosis Date  . Diabetes ([place])   . Diabetes mellitus   . High cholesterol   . Hypothyroid     Past Surgical History:  Procedure Laterality Date  . FINGER SURGERY       reports that she has never smoked. She does not have any smokeless tobacco history on file. She reports that she does not drink alcohol and does not use drugs. Social History  No Known Allergies  No family history on file.   Prior to Admission medications   Medication Sig Start Date End Date Taking? Authorizing Provider  levothyroxine (SYNTHROID, LEVOTHROID) 88 MCG tablet Take 88 mcg by mouth daily.    [provider]  metFORMIN (GLUCOPHAGE) 500 MG tablet Take 500 mg by mouth once.    [provider]  rosuvastatin (CRESTOR) 20 MG tablet Take 20 mg by mouth daily.    [provider]    Physical Exam: Vitals:   09/18/20 1915 09/18/20 2029 09/18/20 2227 09/19/20 0022  BP:  (!) 130/92 137/65 (!) 147/69  Pulse:  92 62 66  Resp:  18 18 18   Temp:   97.9 F (36.6 C) 98.4 F (36.9 C)  TempSrc:   Oral Oral  SpO2:  100% 100% 100%  Weight: 62.6 kg     Height: 5\' 7"  (1.702 m)       Constitutional: NAD, calm, comfortable, thin pleasant elderly female laying flat in bed. Wears hearing aids. Vitals:   09/18/20 1915 09/18/20 2029  09/18/20 2227 09/19/20 0022  BP:  (!) 130/92 137/65 (!) 147/69  Pulse:  92 62 66  Resp:  18 18 18   Temp:   97.9 F (36.6 C) 98.4 F (36.9 C)  TempSrc:   Oral Oral  SpO2:  100% 100% 100%  Weight: 62.6 kg     Height: 5\' 7"  (1.702 m)      Eyes: PERRL, bilateral scleral icterus ENMT: Mucous membranes are moist.  Neck: normal, supple,  Respiratory: clear to auscultation bilaterally, no wheezing, no crackles. Normal respiratory effort. No accessory muscle use.  Cardiovascular: Regular rate  and rhythm, no murmurs / rubs / gallops. No extremity edema. Abdomen: Mild right upper quadrant tenderness with moderate distention, no masses palpated. Bowel sounds positive.  Musculoskeletal: no clubbing / cyanosis. No joint deformity upper and lower extremities. Good ROM, no contractures. Normal muscle tone.  Skin: no rashes, lesions, ulcers. No induration Neurologic: CN 2-12 grossly intact. Sensation intact,Strength 5/5 in all 4.  Psychiatric: Normal judgment and insight. Alert and oriented x 3. Normal mood.    Labs on Admission: I have personally reviewed following labs and imaging studies  CBC: Recent Labs  Lab 09/18/20 1929  WBC 4.3  NEUTROABS 2.2  HGB 12.4  HCT 36.9  MCV 84.4  PLT 595   Basic Metabolic Panel: Recent Labs  Lab 09/18/20 1929  NA 134*  K 3.9  CL 97*  CO2 26  GLUCOSE 238*  BUN 11  CREATININE 0.96  CALCIUM 9.8   GFR: Estimated Creatinine Clearance: 46.2 mL/min (by C-G formula based on SCr of 0.96 mg/dL). Liver Function Tests: Recent Labs  Lab 09/18/20 1929  AST 234*  ALT 226*  ALKPHOS 918*  BILITOT 17.4*  PROT 6.8  ALBUMIN 4.3   Recent Labs  Lab 09/18/20 1929  LIPASE 127*   Recent Labs  Lab 09/18/20 1929  AMMONIA 55*   Coagulation Profile: Recent Labs  Lab 09/18/20 1929  INR 0.9   Cardiac Enzymes: No results for input(s): CKTOTAL, CKMB, CKMBINDEX, TROPONINI in the last 168 hours. BNP (last 3 results) No results for input(s): PROBNP in the last 8760 hours. HbA1C: No results for input(s): HGBA1C in the last 72 hours. CBG: Recent Labs  Lab 09/18/20 1911  GLUCAP 240*   Lipid Profile: No results for input(s): CHOL, HDL, LDLCALC, TRIG, CHOLHDL, LDLDIRECT in the last 72 hours. Thyroid Function Tests: No results for input(s): TSH, T4TOTAL, FREET4, T3FREE, THYROIDAB in the last 72 hours. Anemia Panel: No results for input(s): VITAMINB12, FOLATE, FERRITIN, TIBC, IRON, RETICCTPCT in the last 72 hours. Urine analysis:     Component Value Date/Time   COLORURINE YELLOW 09/18/2020 1929   APPEARANCEUR CLEAR 09/18/2020 1929   LABSPEC <1.005 (L) 09/18/2020 1929   PHURINE 6.0 09/18/2020 [place] NEGATIVE 09/18/2020 1929   HGBUR NEGATIVE 09/18/2020 1929   BILIRUBINUR SMALL (A) 09/18/2020 [place] NEGATIVE 09/18/2020 1929   PROTEINUR NEGATIVE 09/18/2020 1929   NITRITE NEGATIVE 09/18/2020 1929   LEUKOCYTESUR TRACE (A) 09/18/2020 1929    Radiological Exams on Admission: DG Chest Portable 1 View  Result Date: 09/18/2020 CLINICAL DATA:  Chest pain EXAM: PORTABLE CHEST 1 VIEW COMPARISON:  None. FINDINGS: The heart size and mediastinal contours are within normal limits. Aortic atherosclerosis. Both lungs are clear. The visualized skeletal structures are unremarkable. IMPRESSION: No active disease. Electronically Signed   By: Donavan Foil M.D.   On: 09/18/2020 19:52   US Abdomen Limited RUQ (LIVER/GB)  Result Date: 09/18/2020 CLINICAL  DATA:  Jaundice EXAM: ULTRASOUND ABDOMEN LIMITED RIGHT UPPER QUADRANT COMPARISON:  None. FINDINGS: Gallbladder: Sludge seen within the gallbladder. Gallbladder wall is thickened at 5 mm. Small amount of pericholecystic fluid. Common bile duct: Diameter: Dilated measuring up to 13 mm. Liver: Intrahepatic biliary ductal dilatation. Multiple cysts throughout the liver measuring up to 4.6 cm. Complex area posteriorly in the right hepatic lobe measures up to 3.6 cm which could reflect a complex septated cyst. Portal vein is patent on color Doppler imaging with normal direction of blood flow towards the liver. Other: None. IMPRESSION: Intrahepatic and extrahepatic biliary ductal dilatation. Common bile duct dilated up to 13 mm. This could be further evaluated with MRCP or ERCP. Sludge within the gallbladder with associated gallbladder wall thickening and pericholecystic fluid. Cannot exclude acute cholecystitis. Multiple hepatic cysts. Complex cystic area posteriorly in the right hepatic  lobe. This could also be further evaluated with MRI. Electronically Signed   By: Rolm Baptise M.D.   On: 09/18/2020 21:52      Assessment/Plan  Hepatic and extrahepatic biliary duct dilatation Hyperbilirubinemia -There is gallbladder wall thickening seen but patient is afebrile and no significant RUQ tenderness on exam to suggest cholecystitis -Obtain MRCP-suspect cholelithiasis and possible pancreatic malignancy especially in light of recent hyperglycemia -needs GI consult  Transaminitis -secondary to bile duct obstruction -follow with CMP   Type 2 diabetes with hyperglycemia -Placed on sensitive sliding scale  Hypothyroidism - Continue Levothyroxine  HTN -resume antihypertensive once family able to help with med rec in the morning  DVT prophylaxis:.SCDs Code Status: Full Family Communication: Plan discussed with patient at bedside  disposition Plan: Home with at least 2 midnight stays  Consults called:  Admission status: inpatient Level of care: Med-Surg  Status is: Inpatient  Remains inpatient appropriate because:Inpatient level of care appropriate due to severity of illness   Dispo: The patient is from: Home              Anticipated d/c is to: Home              Patient currently is not medically stable to d/c.   Difficult to place patient No         Orene Desanctis DO [facility]   If 7PM-7AM, please contact night-coverage [email or web address]   09/19/2020, 1:40 AM

## 2020-09-19 NOTE — Consult Note (Signed)
Reason for Consult: Dilated CBD and hyperbilirubinemia Referring Physician: [facility]  Aleana A Smither HPI: This is a 80 year old female with a PMH of HTN, hypothyroidism, DM, and GERD admitted for findings of CBD dilation and hyperbilirubinemia.  The patient presented to the ER with complaints of fatigue and RUQ pain.  Since April of this year she reports a 20-30 lbs weight loss as well a worsening of her diabetic control.  Her blood glucose was not able to be controlled with metformin.  It was also at this time that she started to notice "orange" urine.  Her daughter reported that she was struggling mentally with the hyperglycemia in that she was more confused.  Blood work in the ER showed the following:  AST 234, ALT 226, AP 918, and TB 17.4.  Her WBC was normal at 4.3 was well as her HGB at 12.4 g/dL.  A RUQ U/S showed a dilated CBD at 13 mm as well as intrahepatic biliary ductal dilation.  An MRCP was recommended by Radiology  Past Medical History:  Diagnosis Date  . Diabetes ([place])   . Diabetes mellitus   . High cholesterol   . Hypothyroid     Past Surgical History:  Procedure Laterality Date  . FINGER SURGERY      No family history on file.  Social History:  reports that she has never smoked. She does not have any smokeless tobacco history on file. She reports that she does not drink alcohol and does not use drugs.  Allergies: No Known Allergies  Medications:  Scheduled: . insulin aspart  0-9 Units Subcutaneous TID PC,HS,0200  . levothyroxine  88 mcg Oral Q0600   Continuous: . sodium chloride 125 mL/hr at 09/19/20 0209    Results for orders placed or performed during the hospital encounter of 09/18/20 (from the past 24 hour(s))  CBG monitoring, ED     Status: Abnormal   Collection Time: 09/18/20  7:11 PM  Result Value Ref Range   Glucose-Capillary 240 (H) 70 - 99 mg/dL  Comprehensive metabolic panel     Status: Abnormal   Collection Time: 09/18/20  7:29 PM   Result Value Ref Range   Sodium 134 (L) 135 - 145 mmol/L   Potassium 3.9 3.5 - 5.1 mmol/L   Chloride 97 (L) 98 - 111 mmol/L   CO2 26 22 - 32 mmol/L   Glucose, Bld 238 (H) 70 - 99 mg/dL   BUN 11 8 - 23 mg/dL   Creatinine, Ser 0.96 0.44 - 1.00 mg/dL   Calcium 9.8 8.9 - 10.3 mg/dL   Total Protein 6.8 6.5 - 8.1 g/dL   Albumin 4.3 3.5 - 5.0 g/dL   AST 234 (H) 15 - 41 U/L   ALT 226 (H) 0 - 44 U/L   Alkaline Phosphatase 918 (H) 38 - 126 U/L   Total Bilirubin 17.4 (H) 0.3 - 1.2 mg/dL   GFR, Estimated >60 >60 mL/min   Anion gap 11 5 - 15  Lipase, blood     Status: Abnormal   Collection Time: 09/18/20  7:29 PM  Result Value Ref Range   Lipase 127 (H) 11 - 51 U/L  CBC with Diff     Status: Abnormal   Collection Time: 09/18/20  7:29 PM  Result Value Ref Range   WBC 4.3 4.0 - 10.5 K/uL   RBC 4.37 3.87 - 5.11 MIL/uL   Hemoglobin 12.4 12.0 - 15.0 g/dL   HCT 36.9 36.0 - 46.0 %  MCV 84.4 80.0 - 100.0 fL   MCH 28.4 26.0 - 34.0 pg   MCHC 33.6 30.0 - 36.0 g/dL   RDW 16.3 (H) 11.5 - 15.5 %   Platelets 226 150 - 400 K/uL   nRBC 0.0 0.0 - 0.2 %   Neutrophils Relative % 51 %   Neutro Abs 2.2 1.7 - 7.7 K/uL   Lymphocytes Relative 33 %   Lymphs Abs 1.4 0.7 - 4.0 K/uL   Monocytes Relative 9 %   Monocytes Absolute 0.4 0.1 - 1.0 K/uL   Eosinophils Relative 5 %   Eosinophils Absolute 0.2 0.0 - 0.5 K/uL   Basophils Relative 1 %   Basophils Absolute 0.0 0.0 - 0.1 K/uL   Immature Granulocytes 1 %   Abs Immature Granulocytes 0.03 0.00 - 0.07 K/uL  Urinalysis, Routine w reflex microscopic Urine, Clean Catch     Status: Abnormal   Collection Time: 09/18/20  7:29 PM  Result Value Ref Range   Color, Urine YELLOW YELLOW   APPearance CLEAR CLEAR   Specific Gravity, Urine <1.005 (L) 1.005 - 1.030   pH 6.0 5.0 - 8.0   Glucose, UA NEGATIVE NEGATIVE mg/dL   Hgb urine dipstick NEGATIVE NEGATIVE   Bilirubin Urine SMALL (A) NEGATIVE   Ketones, ur NEGATIVE NEGATIVE mg/dL   Protein, ur NEGATIVE NEGATIVE  mg/dL   Nitrite NEGATIVE NEGATIVE   Leukocytes,Ua TRACE (A) NEGATIVE   RBC / HPF 0-5 0 - 5 RBC/hpf   WBC, UA 0-5 0 - 5 WBC/hpf   Squamous Epithelial / LPF 0-5 0 - 5  Ammonia     Status: Abnormal   Collection Time: 09/18/20  7:29 PM  Result Value Ref Range   Ammonia 55 (H) 9 - 35 umol/L  Protime-INR     Status: None   Collection Time: 09/18/20  7:29 PM  Result Value Ref Range   Prothrombin Time 12.6 11.4 - 15.2 seconds   INR 0.9 0.8 - 1.2  APTT     Status: Abnormal   Collection Time: 09/18/20  7:29 PM  Result Value Ref Range   aPTT 38 (H) 24 - 36 seconds  Resp Panel by RT-PCR (Flu A&B, Covid) Nasopharyngeal Swab     Status: None   Collection Time: 09/18/20 10:31 PM   Specimen: Nasopharyngeal Swab; Nasopharyngeal(NP) swabs in vial transport medium  Result Value Ref Range   SARS Coronavirus 2 by RT PCR NEGATIVE NEGATIVE   Influenza A by PCR NEGATIVE NEGATIVE   Influenza B by PCR NEGATIVE NEGATIVE  Comprehensive metabolic panel     Status: Abnormal   Collection Time: 09/19/20  1:28 AM  Result Value Ref Range   Sodium 137 135 - 145 mmol/L   Potassium 3.7 3.5 - 5.1 mmol/L   Chloride 107 98 - 111 mmol/L   CO2 21 (L) 22 - 32 mmol/L   Glucose, Bld 191 (H) 70 - 99 mg/dL   BUN 9 8 - 23 mg/dL   Creatinine, Ser 0.48 0.44 - 1.00 mg/dL   Calcium 8.9 8.9 - 10.3 mg/dL   Total Protein 6.0 (L) 6.5 - 8.1 g/dL   Albumin 3.2 (L) 3.5 - 5.0 g/dL   AST 225 (H) 15 - 41 U/L   ALT 200 (H) 0 - 44 U/L   Alkaline Phosphatase 797 (H) 38 - 126 U/L   Total Bilirubin 14.5 (H) 0.3 - 1.2 mg/dL   GFR, Estimated >60 >60 mL/min   Anion gap 9 5 - 15  Glucose, capillary  Status: Abnormal   Collection Time: 09/19/20  7:41 AM  Result Value Ref Range   Glucose-Capillary 188 (H) 70 - 99 mg/dL     DG Chest Portable 1 View  Result Date: 09/18/2020 CLINICAL DATA:  Chest pain EXAM: PORTABLE CHEST 1 VIEW COMPARISON:  None. FINDINGS: The heart size and mediastinal contours are within normal limits. Aortic  atherosclerosis. Both lungs are clear. The visualized skeletal structures are unremarkable. IMPRESSION: No active disease. Electronically Signed   By: Donavan Foil M.D.   On: 09/18/2020 19:52   US Abdomen Limited RUQ (LIVER/GB)  Result Date: 09/18/2020 CLINICAL DATA:  Jaundice EXAM: ULTRASOUND ABDOMEN LIMITED RIGHT UPPER QUADRANT COMPARISON:  None. FINDINGS: Gallbladder: Sludge seen within the gallbladder. Gallbladder wall is thickened at 5 mm. Small amount of pericholecystic fluid. Common bile duct: Diameter: Dilated measuring up to 13 mm. Liver: Intrahepatic biliary ductal dilatation. Multiple cysts throughout the liver measuring up to 4.6 cm. Complex area posteriorly in the right hepatic lobe measures up to 3.6 cm which could reflect a complex septated cyst. Portal vein is patent on color Doppler imaging with normal direction of blood flow towards the liver. Other: None. IMPRESSION: Intrahepatic and extrahepatic biliary ductal dilatation. Common bile duct dilated up to 13 mm. This could be further evaluated with MRCP or ERCP. Sludge within the gallbladder with associated gallbladder wall thickening and pericholecystic fluid. Cannot exclude acute cholecystitis. Multiple hepatic cysts. Complex cystic area posteriorly in the right hepatic lobe. This could also be further evaluated with MRI. Electronically Signed   By: Rolm Baptise M.D.   On: 09/18/2020 21:52    ROS:  As stated above in the HPI otherwise negative.  Blood pressure 128/62, pulse 66, temperature 98.2 F (36.8 C), temperature source Oral, resp. rate 18, height 5\' 7"  (1.702 m), weight 62.6 kg, SpO2 98 %.    PE: Gen: NAD, Alert and Oriented HEENT:  [place]/AT, EOMI Neck: Supple, no LAD Lungs: CTA Bilaterally CV: RRR without M/G/R ABD: Soft, NTND, +BS Ext: No C/C/E  Assessment/Plan: 1) Dilated CBD. 2) Obstructive liver panel pattern. 3) Weight loss. 4) Worsened DM.   The current findings are concerning for a malignant issue,  specifically a pancreatic cancer.  An MRCP is pending, but with the current findings and having availability, an EUS with FNA/ERCP with stent placement will be pursued.  If it is confirmed that she does have a pancreatic cancer staging imaging will be required.  Plan: 1) EUS with FNA/ERCP with stent placement today.  Sharmain Lastra D 09/19/2020, 9:49 AM

## 2020-09-19 NOTE — Op Note (Signed)
[facility] Patient Name: Tina Patel Procedure Date: 09/19/2020 MRN: [id number] Attending MD: [name] , MD Date of Birth: March 06, 1941 CSN: [id number] Age: 80 Admit Type: Inpatient Procedure:                Upper EUS Indications:              Abnormal ultrasound of the abdomen Providers:                [name], MD, Baird Cancer, RN, Tyrone Apple, Technician Referring MD:              Medicines:                General Anesthesia Complications:            No immediate complications. Estimated Blood Loss:     Estimated blood loss: none. Procedure:                Pre-Anesthesia Assessment:                           - Prior to the procedure, a History and Physical                            was performed, and patient medications and                            allergies were reviewed. The patient's tolerance of                            previous anesthesia was also reviewed. The risks                            and benefits of the procedure and the sedation                            options and risks were discussed with the patient.                            All questions were answered, and informed consent                            was obtained. Prior Anticoagulants: The patient has                            taken no previous anticoagulant or antiplatelet                            agents. ASA Grade Assessment: III - A patient with                            severe systemic disease. After reviewing the risks  and benefits, the patient was deemed in                            satisfactory condition to undergo the procedure.                           - Sedation was administered by an anesthesia                            professional. General anesthesia was attained.                           After obtaining informed consent, the endoscope was                            passed under direct vision.  Throughout the                            procedure, the patient's blood pressure, pulse, and                            oxygen saturations were monitored continuously. The                            [id number] ([id number]) Olympus Linear EUS was                            introduced through the mouth, and advanced to the                            second part of duodenum. The upper EUS was                            accomplished without difficulty. The patient                            tolerated the procedure well. Scope In: Scope Out: Findings:      ENDOSONOGRAPHIC FINDING: :      An irregular mass was identified in the pancreatic head. The mass was       hypoechoic. The mass measured 34 mm by 47 mm in maximal cross-sectional       diameter. The outer margins were irregular. An intact interface was seen       between the mass and the superior mesenteric artery, celiac trunk,       portal vein and superior mesenteric vein suggesting a lack of invasion.       The remainder of the pancreas was examined. The endosonographic       appearance of parenchyma and the upstream pancreatic duct indicated duct       dilation and a maximum duct diameter of 11 mm. Fine needle aspiration       for cytology was performed. Color Doppler imaging was utilized prior to       needle puncture to confirm a lack of significant vascular structures       within the needle path. Five passes were made with  the 25 gauge needle       using a transduodenal approach. A stylet was used. A cytotechnologist       was present to evaluate the adequacy of the specimen. The cellularity of       the specimen was adequate. Final cytology results are pending.      There was dilation in the common hepatic duct which measured up to 13 mm.      There was dilation diffusely throughout the intrahepatic bile duct(s).      A cyst was found in the left lobe of the liver and measured 24 mm by 15       mm in maximal cross-sectional  diameter. The cyst was hypoechoic. It was       without septae. The outer wall of the lesion was thin.      There was no sign of significant endosonographic abnormality in the       entire examined left adrenal gland. No abnormal echogenicity was       identified.      A large and irregularly bordered pancreatic head mass was identified. At       its maximal measurment the lesion was 3.4 cm x 4.7 cm. There was intra       and extrahepatic biliary ductal dilation. The CBD was at 13 mm and there       was sludge in the CBD. The PD was noted to be dilated at 11 mm in the       head of the pancreas. There were no overt lymph nodes at the celiac axis       or peripancreatic. The tail of the pancreas was atrophic and the PD       measured 4.6 mm in this region. Five pass with the 25 gauge Aquire       needle were performed. The Celiac axis and the SMA did not exhibit any       involvement with the mass. The mass is adjacent to the portal confluence       and the SMV and there did not appear to be any involvement, however, in       this region, there was marked anatomic distortion. It was not easy to       visualize structures in this area. Impression:               - A mass was identified in the pancreatic head. The                            staging applies if malignancy is confirmed. Fine                            needle aspiration performed.                           - There was dilation in the common hepatic duct                            which measured up to 13 mm.                           - There was dilation in the intrahepatic bile  ducts, diffusely.                           - A cyst was found in the left lobe of the liver                            and measured 24 mm by 15 mm.                           - Endosonographic images of the left adrenal gland                            were unremarkable. Moderate Sedation:      Not Applicable - Patient had care  per Anesthesia. Recommendation:           - Await cytology report.                           - Proceed with the ERCP with stent placement. Procedure Code(s):        --- Professional ---                           [id number], Esophagogastroduodenoscopy, flexible,                            transoral; with transendoscopic ultrasound-guided                            intramural or transmural fine needle                            aspiration/biopsy(s), (includes endoscopic                            ultrasound examination limited to the esophagus,                            stomach or duodenum, and adjacent structures) Diagnosis Code(s):        --- Professional ---                           K86.89, Other specified diseases of pancreas                           K76.89, Other specified diseases of liver                           K83.8, Other specified diseases of biliary tract                           R93.5, Abnormal findings on diagnostic imaging of                            other abdominal regions, including retroperitoneum CPT copyright 2019 [employer]. All rights reserved. The codes documented in this report are preliminary and upon coder review may  be  revised to meet current compliance requirements. [name], MD [name], MD 09/19/2020 3:00:40 PM This report has been signed electronically. Number of Addenda: 0

## 2020-09-19 NOTE — Op Note (Signed)
[facility] Patient Name: Tina Patel Procedure Date: 09/19/2020 MRN: [id number] Attending MD: [name] , MD Date of Birth: 02/24/1941 CSN: [id number] Age: 80 Admit Type: Inpatient Procedure:                ERCP Indications:              Malignant stricture of the common bile duct Providers:                [name], MD, Baird Cancer, RN, Tyrone Apple, Technician Referring MD:              Medicines:                General Anesthesia Complications:            No immediate complications. Estimated Blood Loss:     Estimated blood loss: none. Procedure:                Pre-Anesthesia Assessment:                           - Prior to the procedure, a History and Physical                            was performed, and patient medications and                            allergies were reviewed. The patient's tolerance of                            previous anesthesia was also reviewed. The risks                            and benefits of the procedure and the sedation                            options and risks were discussed with the patient.                            All questions were answered, and informed consent                            was obtained. Prior Anticoagulants: The patient has                            taken no previous anticoagulant or antiplatelet                            agents. ASA Grade Assessment: III - A patient with                            severe systemic disease. After reviewing the risks  and benefits, the patient was deemed in                            satisfactory condition to undergo the procedure.                           - Sedation was administered by an anesthesia                            professional. General anesthesia was attained.                           After obtaining informed consent, the scope was                            passed under direct vision.  Throughout the                            procedure, the patient's blood pressure, pulse, and                            oxygen saturations were monitored continuously. The                            [id number] ([id number] Olympus Duodenoscope was                            introduced through the mouth, and used to inject                            contrast into and used to inject contrast into the                            bile duct. The ERCP was technically difficult and                            complex. The patient tolerated the procedure well. Scope In: Scope Out: Findings:      The major papilla was normal. A short 0.035 inch Soft Jagwire was passed       into the biliary tree. A 10 mm biliary sphincterotomy was made with a       monofilament traction (standard) sphincterotome using ERBE       electrocautery. There was no post-sphincterotomy bleeding. Cells for       cytology were obtained by brushing in distal CBD. One 10 mm by 4 cm       covered metal stent was placed 4 cm into the common bile duct. Bile and       sludge flowed through the stent. The stent was in good position.      Spontaneous drainage of bile was noted. Cannulation of the ampulla was       achieved, but the guidwire was not able to advance without coiling on       itself. Enough of the sphinctertome was engaged in the ampulla that       allowed for a limited sphincterotomy. With recannulation  the       sphinctertome was able to full engage when it was in the pancreatic       axis. The guidewire was advanced and secured in the left intrahepatic       ducts. Contrast injection showed a dilated proximal and mid CBD. At the       proximal portion of the mid CBD a discrete stricture was identified,       which correlated with the EUS findings. This stricturing was estimated       to be 1.5-2 cm in length. A 10 mm x 40 mm covered metallic stent was       inserted without difficulty with fluoroscopy. Excellent drainage  was       observed and there was a copious amount of sludge that was drained. Impression:               - The major papilla appeared normal.                           - A biliary sphincterotomy was performed.                           - Cells for cytology obtained distal CBD.                           - One covered metal stent was placed into the                            common bile duct. Moderate Sedation:      Not Applicable - Patient had care per Anesthesia. Recommendation:           - Return patient to hospital ward for ongoing care.                           - Await MRCP results. Procedure Code(s):        --- Professional ---                           [phone], Endoscopic retrograde                            cholangiopancreatography (ERCP); with placement of                            endoscopic stent into biliary or pancreatic duct,                            including pre- and post-dilation and guide wire                            passage, when performed, including sphincterotomy,                            when performed, each stent Diagnosis Code(s):        --- Professional ---  K83.1, Obstruction of bile duct CPT copyright 2019 American [employer] Association. All rights reserved. The codes documented in this report are preliminary and upon coder review may  be revised to meet current compliance requirements. [name], MD [name], MD 09/19/2020 3:10:25 PM This report has been signed electronically. Number of Addenda: 0

## 2020-09-19 NOTE — Progress Notes (Signed)
PROGRESS NOTE    Tina Patel  HWE:[id number] DOB: [birth year] DOA: 09/18/2020 PCP: [name], DO   Chief Complaint  Patient presents with  . Hyperglycemia    Brief Narrative:  Tina Patel is Tina Patel 80 y.o. female with medical history significant for hypertension, hypothyroidism, GERD and type 2 diabetes who presents as Delisia Mcquiston transfer from [facility] ED for hyperbilirubinemia and common bile duct dilatation seen and right upper quadrant ultrasound.  Patient reports that for some time she has felt fatigue and for the past 2 months has noted right upper quadrant and mid abdominal pain that is worse at night. Feelings nauseous but no diarrhea.  Notes weight loss of possibly around 20 to 30 pounds since January of this year.  Has also noted that her sugar has been elevated up to 300s which prompted her to seek evaluation today.  She currently is only on metformin.  Denies any tobacco, alcohol or illicit drug use.  No family history of pancreatic cancer.  In the ED, she was afebrile normotensive on room air.  Total bilirubin elevated up to 17.  AST and ALT moderately elevated greater than 200.  Alkaline phosphatase of 900.  Right upper quadrant ultrasound was done instead of contrast study due to contrast shortage.  Showed intrahepatic and extrahepatic biliary duct dilatation.  Patient was then transferred to [name] for hospital admission and GI consultation.   Assessment & Plan:   Principal Problem:   Common bile duct dilatation Active Problems:   Hyperbilirubinemia   DM (diabetes mellitus), type 2 (HCC)   HTN (hypertension)   Hypothyroidism  Elevated Liver Enzymes Cholestatic Pattern  Abdominal Discomfort  Weight Loss Mass in pancreatic head ERCP/EUS done -> notable for mass in pancreatic head, FNA done.  Cytology obtained from distal CBD. S/p sphincterotomy and stent to CBD Trend LFT's  MRCP canceled due to stent placement, will need to review with Dr. 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   Difficult to place patient No       Consultants:   GI c/s  Procedures:  ERCP - The major papilla appeared normal. - Oryn Casanova biliary sphincterotomy was performed. - Cells for cytology obtained distal CBD. - One covered metal stent was placed into the common bile duct. - Return patient to hospital ward for ongoing care. - Await MRCP results.  EUS - Natally Ribera mass was identified in the pancreatic head. The staging applies if malignancy is confirmed. Fine needle aspiration performed. - There was dilation in the common hepatic duct which measured up to 13 mm. - There was dilation in the intrahepatic bile ducts, diffusely. - Airyana Sprunger cyst was found in the left lobe of the liver and measured 24 mm by 15 mm. - Endosonographic images of the left adrenal gland were unremarkable. - Await cytology report. - Proceed with the ERCP with stent  placement.   Antimicrobials:  Anti-infectives (From admission, onward)   None         Subjective: C/o back discomfort  Mild abdominal discomfort  Objective: Vitals:   09/19/20 1232 09/19/20 1453 09/19/20 1505 09/19/20 1601  BP: (!) 145/62 (!) 167/77 (!) 137/91 (!) 149/80  Pulse: 83 96 93 90  Resp: 15 (!) 25 (!) 21 14  Temp: 98.5 F (36.9 C) 97.9 F (36.6 C)  97.8 F (36.6 C)  TempSrc: Oral Axillary  Oral  SpO2: 98% 100% 99% 100%  Weight: 62.6 kg     Height: 5\' 7"  (1.702 m)       Intake/Output  Summary (Last 24 hours) at 09/19/2020 1906 Last data filed at 09/19/2020 1447 Gross per 24 hour  Intake 2859.17 ml  Output 0 ml  Net 2859.17 ml   Filed Weights   09/18/20 1915 09/19/20 1232  Weight: 62.6 kg 62.6 kg    Examination:  General exam: Appears calm and comfortable  Respiratory system: Clear to auscultation. Respiratory effort normal. Cardiovascular system: S1 & S2 heard, RRR.  Gastrointestinal system: Abdomen is nondistended, soft and mild ttp .  Central nervous system: Alert and oriented. No focal neurological deficits. Extremities: no LEE Skin: No rashes, lesions or ulcers Psychiatry: Judgement and insight appear normal. Mood & affect appropriate.     Data Reviewed: I have personally reviewed following labs and imaging studies  CBC: Recent Labs  Lab 09/18/20 1929  WBC 4.3  NEUTROABS 2.2  HGB 12.4  HCT 36.9  MCV 84.4  PLT 505    Basic Metabolic Panel: Recent Labs  Lab 09/18/20 1929 09/19/20 0128  NA 134* 137  K 3.9 3.7  CL 97* 107  CO2 26 21*  GLUCOSE 238* 191*  BUN 11 9  CREATININE 0.96 0.48  CALCIUM 9.8 8.9    GFR: Estimated Creatinine Clearance: 55.5 mL/min (by C-G formula based on SCr of 0.48 mg/dL).  Liver Function Tests: Recent Labs  Lab 09/18/20 1929 09/19/20 0128  AST 234* 225*  ALT 226* 200*  ALKPHOS 918* 797*  BILITOT 17.4* 14.5*  PROT 6.8 6.0*  ALBUMIN 4.3 3.2*    CBG: Recent Labs  Lab 09/18/20 1911 09/19/20 0741 09/19/20 1148 09/19/20 1615  GLUCAP 240* 188* 130* 178*     Recent Results (from the past 240 hour(s))  Resp Panel by RT-PCR (Flu Lenee Franze&B, Covid) Nasopharyngeal Swab     Status: None   Collection Time: 09/18/20 10:31 PM   Specimen: Nasopharyngeal Swab; Nasopharyngeal(NP) swabs in vial transport medium  Result Value Ref Range Status   SARS Coronavirus 2 by RT PCR NEGATIVE NEGATIVE Final    Comment: (NOTE) SARS-CoV-2 target nucleic acids are NOT DETECTED.  The SARS-CoV-2 RNA is generally detectable in upper  respiratory specimens during the acute phase of infection. The lowest concentration of SARS-CoV-2 viral copies this assay can detect is 138 copies/mL. Chin Wachter negative result does not preclude SARS-Cov-2 infection and should not be used as the sole basis for treatment or other patient management decisions. Shirle Provencal negative result may occur with  improper specimen collection/handling, submission of specimen other than nasopharyngeal swab, presence of viral mutation(s) within the areas targeted by this assay, and inadequate number of viral copies(<138 copies/mL). Angeleah Labrake negative result must be combined with clinical observations, patient history, and epidemiological information. The expected result is Negative.  Fact Sheet for Patients:  EntrepreneurPulse.com.au  Fact Sheet for Healthcare Providers:  IncredibleEmployment.be  This test is no t yet approved or cleared by the  [place] Architectural technologist and  has been authorized for detection and/or diagnosis of SARS-CoV-2 by FDA under an Print production planner (EUA). This EUA will remain  in effect (meaning this test can be used) for the duration of the COVID-19 declaration under Section 564(b)(1) of the Act, 21 U.S.C.section 360bbb-3(b)(1), unless the authorization is terminated  or revoked sooner.       Influenza Issaac Shipper by PCR NEGATIVE NEGATIVE Final   Influenza B by PCR NEGATIVE NEGATIVE Final    Comment: (NOTE) The Xpert Xpress SARS-CoV-2/FLU/RSV plus assay is intended as an aid in the diagnosis of influenza from Nasopharyngeal swab specimens and should not be used as Glynna Failla sole basis for treatment. Nasal washings and aspirates are unacceptable for Xpert Xpress SARS-CoV-2/FLU/RSV testing.  Fact Sheet for Patients: EntrepreneurPulse.com.au  Fact Sheet for Healthcare Providers: IncredibleEmployment.be  This test is not yet approved or cleared by the [place] FDA and has been  authorized for detection and/or diagnosis of SARS-CoV-2 by FDA under an Emergency Use Authorization (EUA). This EUA will remain in effect (meaning this test can be used) for the duration of the COVID-19 declaration under Section 564(b)(1) of the Act, 21 U.S.C. section 360bbb-3(b)(1), unless the authorization is terminated or revoked.  Performed at [facility], [address] [place], [place] [address]          Radiology Studies: DG Chest Portable 1 View  Result Date: 09/18/2020 CLINICAL DATA:  Chest pain EXAM: PORTABLE CHEST 1 VIEW COMPARISON:  None. FINDINGS: The heart size and mediastinal contours are within normal limits. Aortic atherosclerosis. Both lungs are clear. The visualized skeletal structures are unremarkable. IMPRESSION: No active disease. Electronically Signed   By: Donavan Foil M.D.   On: 09/18/2020 19:52   DG ERCP BILIARY & PANCREATIC DUCTS  Result Date: 09/19/2020 CLINICAL DATA:  80 year old female with biliary obstruction EXAM: ERCP TECHNIQUE: Multiple spot images obtained with the fluoroscopic device and submitted for interpretation post-procedure. FLUOROSCOPY TIME:  Fluoroscopy Time:  3 minutes 10 seconds Radiation Exposure Index (if provided by the fluoroscopic device): 39.4 mGy Number of Acquired Spot Images: 0 COMPARISON:  None. FINDINGS: Five intraoperative spot images are submitted for review. The images demonstrate  [place] flexible duodenal scope in the descending duodenum followed by wire cannulation of the left intrahepatic ducts. Cholangiogram demonstrates marked dilation of the intra and extrahepatic ducts with significant narrowing of the mid and distal common duct. Ultimately, Vessie Olmsted self expanding metallic stent was placed. There appears to be good drainage of the biliary system on the final image. IMPRESSION: 1. Mid and distal biliary duct obstruction. 2. Successful placement of self expanding metallic biliary stent. These images were submitted for  radiologic interpretation only. Please see the procedural report for the amount of contrast and the fluoroscopy time utilized. Electronically Signed   By: Jacqulynn Cadet M.D.   On: 09/19/2020 15:05   US Abdomen Limited RUQ (LIVER/GB)  Result Date: 09/18/2020 CLINICAL DATA:  Jaundice EXAM: ULTRASOUND ABDOMEN LIMITED RIGHT UPPER QUADRANT COMPARISON:  None. FINDINGS: Gallbladder: Sludge seen within the gallbladder. Gallbladder wall is thickened at 5 mm. Small amount of pericholecystic fluid. Common bile duct: Diameter: Dilated measuring up to 13 mm. Liver: Intrahepatic biliary ductal dilatation. Multiple cysts throughout the liver measuring up to 4.6 cm. Complex area posteriorly in the right hepatic lobe measures up to 3.6 cm which could reflect Donasia Wimes complex septated cyst. Portal vein is patent on color Doppler imaging with normal direction of blood flow towards the liver. Other: None. IMPRESSION: Intrahepatic  and extrahepatic biliary ductal dilatation. Common bile duct dilated up to 13 mm. This could be further evaluated with MRCP or ERCP. Sludge within the gallbladder with associated gallbladder wall thickening and pericholecystic fluid. Cannot exclude acute cholecystitis. Multiple hepatic cysts. Complex cystic area posteriorly in the right hepatic lobe. This could also be further evaluated with MRI. Electronically Signed   By: Rolm Baptise M.D.   On: 09/18/2020 21:52        Scheduled Meds: . insulin aspart  0-9 Units Subcutaneous TID PC,HS,0200  . levothyroxine  88 mcg Oral Q0600   Continuous Infusions:   LOS: 0 days    Time spent: over 30 min    Fayrene Helper, MD [facility]   To contact the attending provider between 7A-7P or the covering provider during after hours 7P-7A, please log into the web site [email or web address] and access using universal [place] password for that web site. If you do not have the password, please call the hospital operator.  09/19/2020, 7:06 PM

## 2020-09-19 NOTE — Anesthesia Procedure Notes (Signed)
Procedure Name: Intubation Date/Time: 09/19/2020 1:14 PM Performed by: Genelle Bal, CRNA Pre-anesthesia Checklist: Patient identified, Emergency Drugs available, Suction available and Patient being monitored Patient Re-evaluated:Patient Re-evaluated prior to induction Oxygen Delivery Method: Circle system utilized Preoxygenation: Pre-oxygenation with 100% oxygen Induction Type: IV induction Ventilation: Mask ventilation without difficulty Laryngoscope Size: Miller and 2 Grade View: Grade I Tube type: Oral Tube size: 7.0 mm Number of attempts: 1 Airway Equipment and Method: Stylet and Oral airway Placement Confirmation: ETT inserted through vocal cords under direct vision,  positive ETCO2 and breath sounds checked- equal and bilateral Secured at: 21 cm Tube secured with: Tape Dental Injury: Teeth and Oropharynx as per pre-operative assessment

## 2020-09-20 ENCOUNTER — Inpatient Hospital Stay (HOSPITAL_COMMUNITY): Payer: Medicare Other

## 2020-09-20 DIAGNOSIS — K831 Obstruction of bile duct: Secondary | ICD-10-CM

## 2020-09-20 DIAGNOSIS — R945 Abnormal results of liver function studies: Secondary | ICD-10-CM

## 2020-09-20 LAB — COMPREHENSIVE METABOLIC PANEL
ALT: 172 U/L — ABNORMAL HIGH (ref 0–44)
AST: 152 U/L — ABNORMAL HIGH (ref 15–41)
Albumin: 2.9 g/dL — ABNORMAL LOW (ref 3.5–5.0)
Alkaline Phosphatase: 693 U/L — ABNORMAL HIGH (ref 38–126)
Anion gap: 7 (ref 5–15)
BUN: 9 mg/dL (ref 8–23)
CO2: 23 mmol/L (ref 22–32)
Calcium: 8.9 mg/dL (ref 8.9–10.3)
Chloride: 107 mmol/L (ref 98–111)
Creatinine, Ser: 0.57 mg/dL (ref 0.44–1.00)
GFR, Estimated: >60 mL/min (ref >60)
Glucose, Bld: 166 mg/dL — ABNORMAL HIGH (ref 70–99)
Potassium: 4.5 mmol/L (ref 3.5–5.1)
Sodium: 137 mmol/L (ref 135–145)
Total Bilirubin: 8.8 mg/dL — ABNORMAL HIGH (ref 0.3–1.2)
Total Protein: 5.8 g/dL — ABNORMAL LOW (ref 6.5–8.1)

## 2020-09-20 LAB — CBC WITH DIFFERENTIAL/PLATELET
Abs Immature Granulocytes: 0.04 10*3/uL (ref 0.00–0.07)
Basophils Absolute: 0 10*3/uL (ref 0.0–0.1)
Basophils Relative: 0 %
Eosinophils Absolute: 0 10*3/uL (ref 0.0–0.5)
Eosinophils Relative: 0 %
HCT: 32.5 % — ABNORMAL LOW (ref 36.0–46.0)
Hemoglobin: 10.7 g/dL — ABNORMAL LOW (ref 12.0–15.0)
Immature Granulocytes: 1 %
Lymphocytes Relative: 13 %
Lymphs Abs: 1 10*3/uL (ref 0.7–4.0)
MCH: 28.4 pg (ref 26.0–34.0)
MCHC: 32.9 g/dL (ref 30.0–36.0)
MCV: 86.2 fL (ref 80.0–100.0)
Monocytes Absolute: 0.3 10*3/uL (ref 0.1–1.0)
Monocytes Relative: 4 %
Neutro Abs: 6.1 10*3/uL (ref 1.7–7.7)
Neutrophils Relative %: 82 %
Platelets: 212 10*3/uL (ref 150–400)
RBC: 3.77 MIL/uL — ABNORMAL LOW (ref 3.87–5.11)
RDW: 16.7 % — ABNORMAL HIGH (ref 11.5–15.5)
WBC: 7.4 10*3/uL (ref 4.0–10.5)
nRBC: 0 % (ref 0.0–0.2)

## 2020-09-20 LAB — MAGNESIUM: Magnesium: 2 mg/dL (ref 1.7–2.4)

## 2020-09-20 LAB — PHOSPHORUS: Phosphorus: 2.8 mg/dL (ref 2.5–4.6)

## 2020-09-20 LAB — CANCER ANTIGEN 19-9: CA 19-9: 84 U/mL — ABNORMAL HIGH (ref 0–35)

## 2020-09-20 LAB — GLUCOSE, CAPILLARY
Glucose-Capillary: 191 mg/dL — ABNORMAL HIGH (ref 70–99)
Glucose-Capillary: 173 mg/dL — ABNORMAL HIGH (ref 70–99)
Glucose-Capillary: 210 mg/dL — ABNORMAL HIGH (ref 70–99)
Glucose-Capillary: 285 mg/dL — ABNORMAL HIGH (ref 70–99)
Glucose-Capillary: 294 mg/dL — ABNORMAL HIGH (ref 70–99)

## 2020-09-20 LAB — HEMOGLOBIN A1C
Hgb A1c MFr Bld: 8.4 % — ABNORMAL HIGH (ref 4.8–5.6)
Mean Plasma Glucose: 194 mg/dL

## 2020-09-20 MED ORDER — GADOBUTROL 1 MMOL/ML IV SOLN
6.0000 mL | Freq: Once | INTRAVENOUS | Status: AC | PRN
  Administered 2020-09-20: 6 mL via INTRAVENOUS

## 2020-09-20 NOTE — Progress Notes (Signed)
[facility] Progress Note cross-covering for Dr. Benson Norway and Dr. Collene Mares  CC:  Elevated LFTs, dilated biliary tree  Subjective:  Feels good this morning.  A little sore in upper abdomen but better than when she came in.  Upper lip is fat/swallen and she is missing her crown.  Objective:  Vital signs in last 24 hours: Temp:  [97.8 F (36.6 C)-98.5 F (36.9 C)] 98.2 F (36.8 C) (06/04 0534) Pulse Rate:  [66-96] 74 (06/04 0534) Resp:  [14-25] 16 (06/04 0534) BP: (128-167)/(62-91) 135/76 (06/04 0534) SpO2:  [98 %-100 %] 100 % (06/04 0534) Weight:  [62.6 kg] 62.6 kg (06/03 1232) Last BM Date: 09/18/20 General:  Alert, Well-developed, in NAD Heart:  Regular rate and rhythm; no murmurs Pulm:  CTAB.  No W/R/R. Abdomen:  Soft, non-distended.  BS present.  Minimal upper abdominal TTP. Extremities:  Without edema. Neurologic:  Alert and oriented x 4;  grossly normal neurologically. Psych:  Alert and cooperative. Normal mood and affect.  Intake/Output from previous day: 06/03 0701 - 06/04 0700 In: 2164.2 [P.O.:360; I.V.:1804.2] Out: 0   Lab Results: Recent Labs    09/18/20 1929 09/20/20 0443  WBC 4.3 7.4  HGB 12.4 10.7*  HCT 36.9 32.5*  PLT 226 212   BMET Recent Labs    09/18/20 1929 09/19/20 0128 09/20/20 0443  NA 134* 137 137  K 3.9 3.7 4.5  CL 97* 107 107  CO2 26 21* 23  GLUCOSE 238* 191* 166*  BUN 11 9 9   CREATININE 0.96 0.48 0.57  CALCIUM 9.8 8.9 8.9   LFT Recent Labs    09/20/20 0443  PROT 5.8*  ALBUMIN 2.9*  AST 152*  ALT 172*  ALKPHOS 693*  BILITOT 8.8*   PT/INR Recent Labs    09/18/20 1929  LABPROT 12.6  INR 0.9   DG Chest Portable 1 View  Result Date: 09/18/2020 CLINICAL DATA:  Chest pain EXAM: PORTABLE CHEST 1 VIEW COMPARISON:  None. FINDINGS: The heart size and mediastinal contours are within normal limits. Aortic atherosclerosis. Both lungs are clear. The visualized skeletal structures are unremarkable. IMPRESSION: No active  disease. Electronically Signed   By: Donavan Foil M.D.   On: 09/18/2020 19:52   DG ERCP BILIARY & PANCREATIC DUCTS  Result Date: 09/19/2020 CLINICAL DATA:  80 year old female with biliary obstruction EXAM: ERCP TECHNIQUE: Multiple spot images obtained with the fluoroscopic device and submitted for interpretation post-procedure. FLUOROSCOPY TIME:  Fluoroscopy Time:  3 minutes 10 seconds Radiation Exposure Index (if provided by the fluoroscopic device): 39.4 mGy Number of Acquired Spot Images: 0 COMPARISON:  None. FINDINGS: Five intraoperative spot images are submitted for review. The images demonstrate a flexible duodenal scope in the descending duodenum followed by wire cannulation of the left intrahepatic ducts. Cholangiogram demonstrates marked dilation of the intra and extrahepatic ducts with significant narrowing of the mid and distal common duct. Ultimately, a self expanding metallic stent was placed. There appears to be good drainage of the biliary system on the final image. IMPRESSION: 1. Mid and distal biliary duct obstruction. 2. Successful placement of self expanding metallic biliary stent. These images were submitted for radiologic interpretation only. Please see the procedural report for the amount of contrast and the fluoroscopy time utilized. Electronically Signed   By: Jacqulynn Cadet M.D.   On: 09/19/2020 15:05   US Abdomen Limited RUQ (LIVER/GB)  Result Date: 09/18/2020 CLINICAL DATA:  Jaundice EXAM: ULTRASOUND ABDOMEN LIMITED RIGHT UPPER QUADRANT COMPARISON:  None. FINDINGS: Gallbladder:  Sludge seen within the gallbladder. Gallbladder wall is thickened at 5 mm. Small amount of pericholecystic fluid. Common bile duct: Diameter: Dilated measuring up to 13 mm. Liver: Intrahepatic biliary ductal dilatation. Multiple cysts throughout the liver measuring up to 4.6 cm. Complex area posteriorly in the right hepatic lobe measures up to 3.6 cm which could reflect a complex septated cyst. Portal vein  is patent on color Doppler imaging with normal direction of blood flow towards the liver. Other: None. IMPRESSION: Intrahepatic and extrahepatic biliary ductal dilatation. Common bile duct dilated up to 13 mm. This could be further evaluated with MRCP or ERCP. Sludge within the gallbladder with associated gallbladder wall thickening and pericholecystic fluid. Cannot exclude acute cholecystitis. Multiple hepatic cysts. Complex cystic area posteriorly in the right hepatic lobe. This could also be further evaluated with MRI. Electronically Signed   By: Rolm Baptise M.D.   On: 09/18/2020 21:52   Assessment / Plan: 1) Dilated CBD. 2) Obstructive liver panel pattern. 3) Weight loss. 4) Worsened DM.  S/p EUS with FNA and ERCP with sphincterotomy and metal stent placed on 6/3.  [facility] the [facility].  Awaiting cytology/tissue results.  [id number].  LFT's trending down well.  For MRCP/MRI abdomen today.   LOS: 1 day   Tina Patel. Tina Patel  09/20/2020, 8:33 AM

## 2020-09-20 NOTE — Progress Notes (Signed)
PROGRESS NOTE    Tina Patel  FXT:[id number] DOB: 07-27-1940 DOA: 09/18/2020 PCP: [name], DO   Chief Complaint  Patient presents with  . Hyperglycemia    Brief Narrative:  Tina Patel is Tina Patel 80 y.o. female with medical history significant for hypertension, hypothyroidism, GERD and type 2 diabetes who presents as Tina Patel transfer from [facility] ED for hyperbilirubinemia and common bile duct dilatation seen and right upper quadrant ultrasound.  Patient reports that for some time she has felt fatigue and for the past 2 months has noted right upper quadrant and mid abdominal pain that is worse at night. Feelings nauseous but no diarrhea.  Notes weight loss of possibly around 20 to 30 pounds since January of this year.  Has also noted that her sugar has been elevated up to 300s which prompted her to seek evaluation today.  She currently is only on metformin.  Denies any tobacco, alcohol or illicit drug use.  No family history of pancreatic cancer.  In the ED, she was afebrile normotensive on room air.  Total bilirubin elevated up to 17.  AST and ALT moderately elevated greater than 200.  Alkaline phosphatase of 900.  Right upper quadrant ultrasound was done instead of contrast study due to contrast shortage.  Showed intrahepatic and extrahepatic biliary duct dilatation.  Patient was then transferred to Tina Patel for hospital admission and GI consultation.   Assessment & Plan:   Principal Problem:   Common bile duct dilatation Active Problems:   Hyperbilirubinemia   DM (diabetes mellitus), type 2 (HCC)   HTN (hypertension)   Hypothyroidism  Elevated Liver Enzymes Cholestatic Pattern  Abdominal Discomfort  Weight Loss Mass in pancreatic head ERCP/EUS done -> notable for mass in pancreatic head, FNA done.  Cytology obtained from distal CBD. 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   Difficult to place patient No       Consultants:   GI c/s  Procedures:  ERCP - The major papilla appeared normal. - Tina Patel biliary sphincterotomy was performed. - Cells for cytology obtained distal CBD. - One covered metal stent was placed into the common bile duct. - Return patient to hospital ward for ongoing care. - Await MRCP results.  EUS - Tina Patel mass was identified in the pancreatic head. The staging applies if malignancy is confirmed. Fine needle aspiration performed. - There was dilation in the common hepatic duct which measured up to 13 mm. - There was dilation in the intrahepatic bile ducts, diffusely. - Tina Patel cyst was found in the left lobe of the liver and measured 24 mm by 15 mm. - Endosonographic images of the left adrenal gland were unremarkable. - Await cytology report. - Proceed with the ERCP with stent placement.   Antimicrobials:  Anti-infectives (From admission, onward)   None         Subjective: No complaints today  Objective: Vitals:   09/19/20 1601 09/19/20 2119 09/20/20 0534 09/20/20 1356  BP: (!) 149/80 (!) 148/79 135/76 117/60  Pulse: 90 90 74 67  Resp: 14 16 16 18   Temp: 97.8 F (36.6 C) 98.4 F (36.9 C) 98.2 F (36.8 C) 97.9 F (36.6 C)  TempSrc: Oral Oral Oral Oral  SpO2: 100% 99% 100% 100%  Weight:      Height:        Intake/Output Summary (Last 24 hours) at 09/20/2020 1456 Last data filed at 09/20/2020  1400 Gross per 24 hour  Intake 840 ml  Output 0 ml  Net 840 ml   Filed Weights   09/18/20 1915 09/19/20 1232  Weight: 62.6 kg 62.6 kg    Examination:  General: No acute distress. Cardiovascular: Heart sounds show Fidela Cieslak regular rate, and rhythm Lungs: Clear to auscultation bilaterally Abdomen: Soft, nontender, nondistended  Neurological: Alert and oriented 3. Moves all extremities 4 . Cranial nerves II through XII grossly intact. Skin: Warm and dry. No rashes or lesions. Extremities: No clubbing or cyanosis. No edema.    Data Reviewed: I have personally reviewed following labs and imaging studies  CBC: Recent Labs  Lab 09/18/20 1929 09/20/20 0443  WBC 4.3 7.4  NEUTROABS 2.2 6.1  HGB 12.4 10.7*  HCT 36.9 32.5*  MCV 84.4 86.2  PLT 226 824    Basic Metabolic Panel: Recent Labs  Lab 09/18/20 1929 09/19/20 0128 09/20/20 0443  NA 134* 137 137  K 3.9 3.7 4.5  CL 97* 107 107  CO2 26 21* 23  GLUCOSE 238* 191* 166*  BUN 11 9 9   CREATININE 0.96 0.48 0.57  CALCIUM 9.8 8.9 8.9  MG  --   --  2.0  PHOS  --   --  2.8    GFR: Estimated Creatinine Clearance: 55.5 mL/min (by C-G formula based on SCr of 0.57 mg/dL).  Liver Function Tests: Recent Labs  Lab 09/18/20 1929 09/19/20 0128 09/20/20 0443  AST 234* 225* 152*  ALT 226* 200* 172*  ALKPHOS 918* 797* 693*  BILITOT 17.4* 14.5* 8.8*  PROT 6.8 6.0* 5.8*  ALBUMIN 4.3 3.2* 2.9*    CBG: Recent Labs  Lab 09/19/20 1615 09/19/20 2120 09/20/20 0142 09/20/20 0712 09/20/20 1202  GLUCAP 178* 291* 191* 173* 210*     Recent Results (from the past 240 hour(s))  Resp Panel by RT-PCR (Flu Shadai Mcclane&B, Covid) Nasopharyngeal Swab     Status: None   Collection Time: 09/18/20 10:31 PM   Specimen: Nasopharyngeal Swab; Nasopharyngeal(NP) swabs in vial transport medium  Result Value Ref Range Status   SARS Coronavirus 2 by RT PCR NEGATIVE NEGATIVE Final    Comment: (NOTE) SARS-CoV-2 target nucleic acids are NOT  DETECTED.  The SARS-CoV-2 RNA is generally detectable in upper respiratory specimens during the acute phase of infection. The lowest concentration of SARS-CoV-2 viral copies this assay can detect is 138 copies/mL. Tina Patel negative result does not preclude SARS-Cov-2 infection and should not be used as the sole basis for treatment or other patient management decisions. Ayani Ospina negative result may occur with  improper specimen collection/handling, submission of specimen other than nasopharyngeal swab, presence of viral mutation(s) within the areas targeted by this assay, and inadequate number of viral copies(<138 copies/mL). Amayia Ciano negative result must be combined with clinical observations, patient history, and epidemiological information. The expected result is Negative.  Fact Sheet  for Patients:  EntrepreneurPulse.com.au  Fact Sheet for Healthcare Providers:  IncredibleEmployment.be  This test is no t yet approved or cleared by the [place] FDA and  has been authorized for detection and/or diagnosis of SARS-CoV-2 by FDA under an Emergency Use Authorization (EUA). This EUA will remain  in effect (meaning this test can be used) for the duration of the COVID-19 declaration under Section 564(b)(1) of the Act, 21 U.S.C.section 360bbb-3(b)(1), unless the authorization is terminated  or revoked sooner.       Influenza Govind Furey by PCR NEGATIVE NEGATIVE Final   Influenza B by PCR NEGATIVE NEGATIVE Final    Comment: (NOTE) The Xpert Xpress SARS-CoV-2/FLU/RSV plus assay is intended as an aid in the diagnosis of influenza from Nasopharyngeal swab specimens and should not be used as Anaelle Dunton sole basis for treatment. Nasal washings and aspirates are unacceptable for Xpert Xpress SARS-CoV-2/FLU/RSV testing.  Fact Sheet for Patients: EntrepreneurPulse.com.au  Fact Sheet for Healthcare Providers: IncredibleEmployment.be  This test is not yet  approved or cleared by the [place] FDA and has been authorized for detection and/or diagnosis of SARS-CoV-2 by FDA under an Emergency Use Authorization (EUA). This EUA will remain in effect (meaning this test can be used) for the duration of the COVID-19 declaration under Section 564(b)(1) of the Act, 21 U.S.C. section 360bbb-3(b)(1), unless the authorization is terminated or revoked.  Performed at [facility], [address] [place], [place] [address]          Radiology Studies: DG Chest Portable 1 View  Result Date: 09/18/2020 CLINICAL DATA:  Chest pain EXAM: PORTABLE CHEST 1 VIEW COMPARISON:  None. FINDINGS: The heart size and mediastinal contours are within normal limits. Aortic atherosclerosis. Both lungs are clear. The visualized skeletal structures are unremarkable. IMPRESSION: No active disease. Electronically Signed   By: Donavan Foil M.D.   On: 09/18/2020 19:52   DG ERCP BILIARY & PANCREATIC DUCTS  Result Date: 09/19/2020 CLINICAL DATA:  80 year old female with biliary obstruction EXAM: ERCP TECHNIQUE: Multiple spot images obtained with the fluoroscopic device and submitted for interpretation post-procedure. FLUOROSCOPY TIME:  Fluoroscopy Time:  3 minutes 10 seconds Radiation Exposure Index (if provided by the fluoroscopic device): 39.4 mGy Number of Acquired Spot Images: 0 COMPARISON:  None. FINDINGS: Five intraoperative spot images are submitted for review. The images demonstrate Edgardo Petrenko flexible duodenal scope in the descending duodenum followed by wire cannulation of the left intrahepatic ducts. Cholangiogram demonstrates marked dilation of the intra and extrahepatic ducts with significant narrowing of the mid and distal common duct. Ultimately, Athaliah Baumbach self expanding metallic stent was placed. There appears to be good drainage of the biliary system on the final image. IMPRESSION: 1. Mid and distal biliary duct obstruction. 2. Successful placement of self  expanding metallic biliary stent. These images were submitted for radiologic interpretation only. Please see the procedural report for the amount of contrast and the fluoroscopy time utilized. Electronically Signed   By: Jacqulynn Cadet M.D.   On: 09/19/2020 15:05   US Abdomen Limited RUQ (LIVER/GB)  Result Date: 09/18/2020 CLINICAL DATA:  Jaundice EXAM: ULTRASOUND ABDOMEN LIMITED RIGHT UPPER QUADRANT COMPARISON:  None. FINDINGS: Gallbladder: Sludge seen within the gallbladder. Gallbladder wall is thickened at 5 mm. Small amount of pericholecystic fluid. Common bile duct: Diameter: Dilated measuring up to 13 mm. Liver: Intrahepatic biliary ductal dilatation. Multiple cysts throughout the liver measuring up to 4.6 cm. Complex area posteriorly in the right hepatic lobe measures up to 3.6 cm which could reflect Alea Ryer  complex septated cyst. Portal vein is patent on color Doppler imaging with normal direction of blood flow towards the liver. Other: None. IMPRESSION: Intrahepatic and extrahepatic biliary ductal dilatation. Common bile duct dilated up to 13 mm. This could be further evaluated with MRCP or ERCP. Sludge within the gallbladder with associated gallbladder wall thickening and pericholecystic fluid. Cannot exclude acute cholecystitis. Multiple hepatic cysts. Complex cystic area posteriorly in the right hepatic lobe. This could also be further evaluated with MRI. Electronically Signed   By: Rolm Baptise M.D.   On: 09/18/2020 21:52        Scheduled Meds: . amLODipine  5 mg Oral Daily  . famotidine  20 mg Oral QHS  . furosemide  20 mg Oral Daily  . insulin aspart  0-9 Units Subcutaneous TID PC,HS,0200  . levothyroxine  88 mcg Oral Q0600  . lisinopril  10 mg Oral Daily   Continuous Infusions:   LOS: 1 day    Time spent: over 30 min    Fayrene Helper, MD [facility]   To contact the attending provider between 7A-7P or the covering provider during after hours 7P-7A, please log into  the web site [email or web address] and access using universal [place] password for that web site. If you do not have the password, please call the hospital operator.  09/20/2020, 2:56 PM

## 2020-09-21 ENCOUNTER — Inpatient Hospital Stay (HOSPITAL_COMMUNITY): Payer: Medicare Other

## 2020-09-21 LAB — COMPREHENSIVE METABOLIC PANEL
ALT: 141 U/L — ABNORMAL HIGH (ref 0–44)
AST: 106 U/L — ABNORMAL HIGH (ref 15–41)
Albumin: 3.1 g/dL — ABNORMAL LOW (ref 3.5–5.0)
Alkaline Phosphatase: 621 U/L — ABNORMAL HIGH (ref 38–126)
Anion gap: 11 (ref 5–15)
BUN: 12 mg/dL (ref 8–23)
CO2: 23 mmol/L (ref 22–32)
Calcium: 8.8 mg/dL — ABNORMAL LOW (ref 8.9–10.3)
Chloride: 105 mmol/L (ref 98–111)
Creatinine, Ser: 0.68 mg/dL (ref 0.44–1.00)
GFR, Estimated: >60 mL/min (ref >60)
Glucose, Bld: 160 mg/dL — ABNORMAL HIGH (ref 70–99)
Potassium: 3.6 mmol/L (ref 3.5–5.1)
Sodium: 139 mmol/L (ref 135–145)
Total Bilirubin: 7.1 mg/dL — ABNORMAL HIGH (ref 0.3–1.2)
Total Protein: 6 g/dL — ABNORMAL LOW (ref 6.5–8.1)

## 2020-09-21 LAB — CBC WITH DIFFERENTIAL/PLATELET
Abs Immature Granulocytes: 0.03 10*3/uL (ref 0.00–0.07)
Basophils Absolute: 0 10*3/uL (ref 0.0–0.1)
Basophils Relative: 1 %
Eosinophils Absolute: 0.1 10*3/uL (ref 0.0–0.5)
Eosinophils Relative: 1 %
HCT: 32.7 % — ABNORMAL LOW (ref 36.0–46.0)
Hemoglobin: 10.6 g/dL — ABNORMAL LOW (ref 12.0–15.0)
Immature Granulocytes: 0 %
Lymphocytes Relative: 18 %
Lymphs Abs: 1.4 10*3/uL (ref 0.7–4.0)
MCH: 28.3 pg (ref 26.0–34.0)
MCHC: 32.4 g/dL (ref 30.0–36.0)
MCV: 87.2 fL (ref 80.0–100.0)
Monocytes Absolute: 0.5 10*3/uL (ref 0.1–1.0)
Monocytes Relative: 7 %
Neutro Abs: 5.9 10*3/uL (ref 1.7–7.7)
Neutrophils Relative %: 73 %
Platelets: 211 10*3/uL (ref 150–400)
RBC: 3.75 MIL/uL — ABNORMAL LOW (ref 3.87–5.11)
RDW: 16.8 % — ABNORMAL HIGH (ref 11.5–15.5)
WBC: 8 10*3/uL (ref 4.0–10.5)
nRBC: 0 % (ref 0.0–0.2)

## 2020-09-21 LAB — GLUCOSE, CAPILLARY
Glucose-Capillary: 162 mg/dL — ABNORMAL HIGH (ref 70–99)
Glucose-Capillary: 372 mg/dL — ABNORMAL HIGH (ref 70–99)
Glucose-Capillary: 283 mg/dL — ABNORMAL HIGH (ref 70–99)

## 2020-09-21 LAB — PHOSPHORUS: Phosphorus: 3.9 mg/dL (ref 2.5–4.6)

## 2020-09-21 LAB — MAGNESIUM: Magnesium: 1.9 mg/dL (ref 1.7–2.4)

## 2020-09-21 MED ORDER — PHENOL 1.4 % MT LIQD
1.0000 | OROMUCOSAL | Status: DC | PRN
  Administered 2020-09-21: 1 via OROMUCOSAL
  Filled 2020-09-21: qty 177

## 2020-09-21 MED ORDER — IOHEXOL 300 MG/ML  SOLN
80.0000 mL | Freq: Once | INTRAMUSCULAR | Status: AC | PRN
  Administered 2020-09-21: 80 mL via INTRAVENOUS

## 2020-09-21 MED ORDER — IOHEXOL 9 MG/ML PO SOLN
500.0000 mL | ORAL | Status: AC
  Administered 2020-09-21 (×2): 500 mL via ORAL

## 2020-09-21 MED ORDER — INSULIN GLARGINE 100 UNIT/ML ~~LOC~~ SOLN
5.0000 [IU] | Freq: Every day | SUBCUTANEOUS | Status: DC
  Administered 2020-09-21 – 2020-09-22 (×2): 5 [IU] via SUBCUTANEOUS
  Filled 2020-09-21 (×2): qty 0.05

## 2020-09-21 NOTE — Progress Notes (Signed)
PROGRESS NOTE    Tina Patel  PNT:[id number] DOB: 1940/06/23 DOA: 09/18/2020 PCP: [name], DO   Chief Complaint  Patient presents with  . Hyperglycemia    Brief Narrative:  Tina Patel is [facility] 80 y.o. female with medical history significant for hypertension, hypothyroidism, GERD and type 2 diabetes who presents as Tina Patel transfer from [facility] ED for hyperbilirubinemia and common bile duct dilatation seen and right upper quadrant ultrasound.  Patient reports that for some time she has felt fatigue and for the past 2 months has noted right upper quadrant and mid abdominal pain that is worse at night. Feelings nauseous but no diarrhea.  Notes weight loss of possibly around 20 to 30 pounds since January of this year.  Has also noted that her sugar has been elevated up to 300s which prompted her to seek evaluation today.  She currently is only on metformin.  Denies any tobacco, alcohol or illicit drug use.  No family history of pancreatic cancer.  In the ED, she was afebrile normotensive on room air.  Total bilirubin elevated up to 17.  AST and ALT moderately elevated greater than 200.  Alkaline phosphatase of 900.  Right upper quadrant ultrasound was done instead of contrast study due to contrast shortage.  Showed intrahepatic and extrahepatic biliary duct dilatation.  Patient was then transferred to [name] for hospital admission and GI consultation.   Assessment & Plan:   Principal Problem:   Common bile duct dilatation Active Problems:   Hyperbilirubinemia   DM (diabetes mellitus), type 2 (HCC)   HTN (hypertension)   Hypothyroidism  Elevated Liver Enzymes Cholestatic Pattern  Abdominal Discomfort  Weight Loss Mass in pancreatic head ERCP/EUS done -> notable for mass in pancreatic head, FNA done.  Cytology obtained from distal CBD. S/p sphincterotomy and stent to CBD Trend LFT's - improving LFT's today MRCP with mass in the had of the pancreas with upstream pancreatic  duct dilatation and atrophy (highly concerning for pancreatic adenocarcinoma).  Mild biliary duct dilatation of the common hepaticbile duct and the intrahepatic bile ducts in the L hepatic lobe.  Concern for obstruction of the CBD by the pancreatic head mass.  Multiple cystic lesions throughout the liver parenchyma (favor complex benighn cyst, consistent liver protocol CT) GI C/s, appreciate recs CA 19-9 elevated Discussed with Dr. Chryl Heck on 6/5, recommended CT chest and CT liver.  Noted she'd wait for bx results prior to eval.  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   Difficult to place patient No       Consultants:   GI c/s  Procedures:  ERCP - The major papilla appeared normal. - Beverly Suriano biliary sphincterotomy was performed. - Cells for cytology obtained distal CBD. - One covered metal stent was placed into the common bile duct. - Return patient to hospital ward for ongoing care. - Await MRCP results.  EUS - [name] mass was  identified in the pancreatic head. The staging applies if malignancy is confirmed. Fine needle aspiration performed. - There was dilation in the common hepatic duct which measured up to 13 mm. - There was dilation in the intrahepatic bile ducts, diffusely. - [name] cyst was found in the left lobe of the liver and measured 24 mm by 15 mm. - Endosonographic images of the left adrenal gland were unremarkable. - Await cytology report. - Proceed with the ERCP with stent  placement.   Antimicrobials:  Anti-infectives (From admission, onward)   None         Subjective: In good spirits [birth year]  Objective: Vitals:   09/20/20 1356 09/20/20 2157 09/21/20 0557 09/21/20 1318  BP: 117/60 121/60 (!) 111/56 (!) 110/59  Pulse: 67 84 70 73  Resp: 18 18 18 18   Temp: 97.9 F (36.6 C) 98.2 F (36.8 C) 98.1 F (36.7 C) 98.1 F (36.7 C)  TempSrc: Oral Oral Oral Oral  SpO2: 100% 100% 99% 98%  Weight:      Height:        Intake/Output Summary (Last 24 hours) at 09/21/2020 1439 Last data filed at 09/21/2020 1110 Gross per 24 hour  Intake 1200 ml  Output 250 ml  Net 950 ml   Filed Weights   09/18/20 1915 09/19/20 1232  Weight: 62.6 kg 62.6 kg    Examination:  General: No acute distress. Scleral icterus  Cardiovascular: Heart sounds show Jin Capote regular rate, and rhythm. Lungs: Clear to auscultation bilaterally \ Abdomen: Soft, nontender, nondistended  Neurological: Alert and oriented 3 - hard of hearing. Moves all extremities 4 . Cranial nerves II through XII grossly intact. Skin: Warm and dry. No rashes or lesions. Extremities: No clubbing or cyanosis. No edema.    Data Reviewed: I have personally reviewed following labs and imaging studies  CBC: Recent Labs  Lab 09/18/20 1929 09/20/20 0443 09/21/20 0424  WBC 4.3 7.4 8.0  NEUTROABS 2.2 6.1 5.9  HGB 12.4 10.7* 10.6*  HCT 36.9 32.5* 32.7*  MCV 84.4 86.2 87.2  PLT 226 212 233    Basic Metabolic Panel: Recent Labs  Lab 09/18/20 1929 09/19/20 0128 09/20/20 0443 09/21/20 0424  NA 134* 137 137 139  K 3.9 3.7 4.5 3.6  CL 97* 107 107 105  CO2 26 21* 23 23  GLUCOSE 238* 191* 166* 160*  BUN 11 9 9 12   CREATININE 0.96 0.48 0.57 0.68  CALCIUM 9.8 8.9 8.9 8.8*  MG  --   --  2.0 1.9  PHOS  --   --  2.8 3.9    GFR: Estimated Creatinine Clearance: 54.5 mL/min (by C-G formula based on SCr of 0.68 mg/dL).  Liver Function Tests: Recent Labs  Lab 09/18/20 1929 09/19/20 0128  09/20/20 0443 09/21/20 0424  AST 234* 225* 152* 106*  ALT 226* 200* 172* 141*  ALKPHOS 918* 797* 693* 621*  BILITOT 17.4* 14.5* 8.8* 7.1*  PROT 6.8 6.0* 5.8* 6.0*  ALBUMIN 4.3 3.2* 2.9* 3.1*    CBG: Recent Labs  Lab 09/20/20 1202 09/20/20 1634 09/20/20 2156 09/21/20 0746 09/21/20 1224  GLUCAP 210* 285* 294* 162* 372*     Recent Results (from the past 240 hour(s))  Resp Panel by RT-PCR (Flu Horald Birky&B, Covid) Nasopharyngeal Swab     Status: None   Collection Time: 09/18/20 10:31 PM   Specimen: Nasopharyngeal Swab; Nasopharyngeal(NP) swabs in vial transport medium  Result Value Ref Range Status   SARS Coronavirus 2 by RT PCR NEGATIVE NEGATIVE  Final    Comment: (NOTE) SARS-CoV-2 target nucleic acids are NOT DETECTED.  The SARS-CoV-2 RNA is generally detectable in upper respiratory specimens during the acute phase of infection. The lowest concentration of SARS-CoV-2 viral copies this assay can detect is 138 copies/mL. Tilly Pernice negative result does not preclude SARS-Cov-2 infection and should not be used as the sole basis for treatment or other patient management decisions. Josselyne Onofrio negative result may occur with  improper specimen collection/handling, submission of specimen other than nasopharyngeal swab, presence of viral mutation(s) within the areas targeted by this assay, and inadequate number of viral copies(<138 copies/mL). Tristain Daily negative result must be combined with clinical observations, patient history, and epidemiological information. The expected result is Negative.  Fact Sheet for Patients:  EntrepreneurPulse.com.au  Fact Sheet for Healthcare Providers:  IncredibleEmployment.be  This test is no t yet approved or cleared by the [place] FDA and  has been authorized for detection and/or diagnosis of SARS-CoV-2 by FDA under an Emergency Use Authorization (EUA). This EUA will remain  in effect (meaning this test can be used) for the duration of  the COVID-19 declaration under Section 564(b)(1) of the Act, 21 U.S.C.section 360bbb-3(b)(1), unless the authorization is terminated  or revoked sooner.       Influenza Nixon Kolton by PCR NEGATIVE NEGATIVE Final   Influenza B by PCR NEGATIVE NEGATIVE Final    Comment: (NOTE) The Xpert Xpress SARS-CoV-2/FLU/RSV plus assay is intended as an aid in the diagnosis of influenza from Nasopharyngeal swab specimens and should not be used as Daphna Lafuente sole basis for treatment. Nasal washings and aspirates are unacceptable for Xpert Xpress SARS-CoV-2/FLU/RSV testing.  Fact Sheet for Patients: EntrepreneurPulse.com.au  Fact Sheet for Healthcare Providers: IncredibleEmployment.be  This test is not yet approved or cleared by the [place] FDA and has been authorized for detection and/or diagnosis of SARS-CoV-2 by FDA under an Emergency Use Authorization (EUA). This EUA will remain in effect (meaning this test can be used) for the duration of the COVID-19 declaration under Section 564(b)(1) of the Act, 21 U.S.C. section 360bbb-3(b)(1), unless the authorization is terminated or revoked.  Performed at [facility], [address] [place], [place] [address]          Radiology Studies: MR 3D Recon At Scanner  Result Date: 09/21/2020 CLINICAL DATA:  Jaundice. Common bile duct obstruction seen on RIGHT upper quadrant ultrasound EXAM: MRI ABDOMEN WITHOUT AND WITH CONTRAST (INCLUDING MRCP) TECHNIQUE: Multiplanar multisequence MR imaging of the abdomen was performed both before and after the administration of intravenous contrast. Heavily T2-weighted images of the biliary and pancreatic ducts were obtained, and three-dimensional MRCP images were rendered by post processing. CONTRAST:  31mL GADAVIST GADOBUTROL 1 MMOL/ML IV SOLN COMPARISON:  Ultrasound 09/18/2020 FINDINGS: Exam is degraded respiratory motion. Best images possible. The MRCP images are  significantly degraded. Additionally is IV infiltration therefore no true T1 noncontrast imaging was available Lower chest:  Lung bases are clear. Hepatobiliary: The common hepatic duct and common bile duct are prominent. The common bile duct is upper limits of normal at 6 mm. There is Waqas Bruhl mass in the pancreatic head which may partially obstructs the common bile duct. There is mild biliary duct dilatation in the LEFT hepatic lobe best seen on delayed contrast imaging (image 32/53). There multiple round cystic lesions in LEFT and RIGHT hepatic lobe which are hyperintense on T2 weighted imaging (series 4). The cysts have no appreciable post-contrast enhancement. Some limitation in contrast enhanced assessment due to patient motion as well  as IV infiltration which resulted in no true noncontrast pre contrast imaging. Pancreas: In the head of the pancreas there is Micheline Markes hypoenhancing mass measuring 3.2 by 2.9 cm on image 61/series 18. Lesion is well seen as mixed hyperintensity on T2 weighted imaging measuring 2.7 by 2.5 cm on image 26/4. There is significant upstream pancreatic duct dilatation to 8 mm (21/4). There is atrophy of the pancreatic body and tail related to the duct dilatation. Spleen: Normal spleen. Adrenals/urinary tract: Adrenal glands and kidneys are normal. Stomach/Bowel: Stomach and limited of the small bowel is unremarkable Vascular/Lymphatic: Abdominal aortic normal caliber. No retroperitoneal periportal lymphadenopathy. Musculoskeletal: No aggressive osseous lesion IMPRESSION: 1. 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 Recommend ERCP/EUS or tissue sampling. 2. There is mild biliary ductal dilatation of the common hepatic bile duct and the intrahepatic bile ducts in the LEFT hepatic lobe. Sequences are severely degraded by patient motion. Concern for obstruction of the common bile duct by the pancreatic head  mass. 3. Multiple cystic lesions throughout the liver parenchyma. Assessment of enhancement is difficult due to patient motion as well as no true noncontrast T1 weighted imaging as described above. Favor complex benign cysts but consider liver protocol contrast CT for further evaluation of the cystic lesions to exclude metastasis. Electronically Signed   By: Suzy Bouchard M.D.   On: 09/21/2020 07:28   DG ERCP BILIARY & PANCREATIC DUCTS  Result Date: 09/19/2020 CLINICAL DATA:  80 year old female with biliary obstruction EXAM: ERCP TECHNIQUE: Multiple spot images obtained with the fluoroscopic device and submitted for interpretation post-procedure. FLUOROSCOPY TIME:  Fluoroscopy Time:  3 minutes 10 seconds Radiation Exposure Index (if provided by the fluoroscopic device): 39.4 mGy Number of Acquired Spot Images: 0 COMPARISON:  None. FINDINGS: Five intraoperative spot images are submitted for review. The images demonstrate Adilson Grafton flexible duodenal scope in the descending duodenum followed by wire cannulation of the left intrahepatic ducts. Cholangiogram demonstrates marked dilation of the intra and extrahepatic ducts with significant narrowing of the mid and distal common duct. Ultimately, Solangel Mcmanaway self expanding metallic stent was placed. There appears to be good drainage of the biliary system on the final image. IMPRESSION: 1. Mid and distal biliary duct obstruction. 2. Successful placement of self expanding metallic biliary stent. These images were submitted for radiologic interpretation only. Please see the procedural report for the amount of contrast and the fluoroscopy time utilized. Electronically Signed   By: Jacqulynn Cadet M.D.   On: 09/19/2020 15:05   MR ABDOMEN MRCP W WO CONTAST  Result Date: 09/21/2020 CLINICAL DATA:  Jaundice. Common bile duct obstruction seen on RIGHT upper quadrant ultrasound EXAM: MRI ABDOMEN WITHOUT AND WITH CONTRAST (INCLUDING MRCP) TECHNIQUE: Multiplanar multisequence MR imaging of  the abdomen was performed both before and after the administration of intravenous contrast. Heavily T2-weighted images of the biliary and pancreatic ducts were obtained, and three-dimensional MRCP images were rendered by post processing. CONTRAST:  31mL GADAVIST GADOBUTROL 1 MMOL/ML IV SOLN COMPARISON:  Ultrasound 09/18/2020 FINDINGS: Exam is degraded respiratory motion. Best images possible. The MRCP images are significantly degraded. Additionally is IV infiltration therefore no true T1 noncontrast imaging was available Lower chest:  Lung bases are clear. Hepatobiliary: The common hepatic duct and common bile duct are prominent. The common bile duct is upper limits of normal at 6 mm. There is Millisa Giarrusso mass in the pancreatic head which may partially obstructs the common bile duct. There is  mild biliary duct dilatation in the LEFT hepatic lobe best seen on delayed contrast imaging (image 32/53). There multiple round cystic lesions in LEFT and RIGHT hepatic lobe which are hyperintense on T2 weighted imaging (series 4). The cysts have no appreciable post-contrast enhancement. Some limitation in contrast enhanced assessment due to patient motion as well as IV infiltration which resulted in no true noncontrast pre contrast imaging. Pancreas: In the head of the pancreas there is Tahir Blank hypoenhancing mass measuring 3.2 by 2.9 cm on image 61/series 18. Lesion is well seen as mixed hyperintensity on T2 weighted imaging measuring 2.7 by 2.5 cm on image 26/4. There is significant upstream pancreatic duct dilatation to 8 mm (21/4). There is atrophy of the pancreatic body and tail related to the duct dilatation. Spleen: Normal spleen. Adrenals/urinary tract: Adrenal glands and kidneys are normal. Stomach/Bowel: Stomach and limited of the small bowel is unremarkable Vascular/Lymphatic: Abdominal aortic normal caliber. No retroperitoneal periportal lymphadenopathy. Musculoskeletal: No aggressive osseous lesion IMPRESSION: 1. Mass in the head  of the pancreas with upstream pancreatic duct dilatation and atrophy is highly concerning for pancreatic adenocarcinoma. Recommend ERCP/EUS or tissue sampling. 2. There is mild biliary ductal dilatation of the common hepatic bile duct and the intrahepatic bile ducts in the LEFT hepatic lobe. Sequences are severely degraded by patient motion. Concern for obstruction of the common bile duct by the pancreatic head mass. 3. Multiple cystic lesions throughout the liver parenchyma. Assessment of enhancement is difficult due to patient motion as well as no true noncontrast T1 weighted imaging as described above. Favor complex benign cysts but consider liver protocol contrast CT for further evaluation of the cystic lesions to exclude metastasis. Electronically Signed   By: Suzy Bouchard M.D.   On: 09/21/2020 07:28        Scheduled Meds: . amLODipine  5 mg Oral Daily  . famotidine  20 mg Oral QHS  . furosemide  20 mg Oral Daily  . insulin aspart  0-9 Units Subcutaneous TID PC,HS,0200  . iohexol  500 mL Oral Q1H  . levothyroxine  88 mcg Oral Q0600  . lisinopril  10 mg Oral Daily   Continuous Infusions:   LOS: 2 days    Time spent: over 30 min    Fayrene Helper, MD [facility]   To contact the attending provider between 7A-7P or the covering provider during after hours 7P-7A, please log into the web site [email or web address] and access using universal [place] password for that web site. If you do not have the password, please call the hospital operator.  09/21/2020, 2:39 PM

## 2020-09-21 NOTE — Progress Notes (Signed)
Dr Florene Glen made aware of CBG up to 372 this afternoon.

## 2020-09-22 ENCOUNTER — Encounter (HOSPITAL_COMMUNITY): Payer: Self-pay | Admitting: Gastroenterology

## 2020-09-22 ENCOUNTER — Other Ambulatory Visit: Payer: Self-pay | Admitting: Oncology

## 2020-09-22 DIAGNOSIS — K838 Other specified diseases of biliary tract: Secondary | ICD-10-CM

## 2020-09-22 LAB — GLUCOSE, CAPILLARY
Glucose-Capillary: 115 mg/dL — ABNORMAL HIGH (ref 70–99)
Glucose-Capillary: 160 mg/dL — ABNORMAL HIGH (ref 70–99)
Glucose-Capillary: 155 mg/dL — ABNORMAL HIGH (ref 70–99)
Glucose-Capillary: 260 mg/dL — ABNORMAL HIGH (ref 70–99)

## 2020-09-22 LAB — CBC WITH DIFFERENTIAL/PLATELET
Abs Immature Granulocytes: 0.04 10*3/uL (ref 0.00–0.07)
Basophils Absolute: 0.1 10*3/uL (ref 0.0–0.1)
Basophils Relative: 1 %
Eosinophils Absolute: 0.4 10*3/uL (ref 0.0–0.5)
Eosinophils Relative: 5 %
HCT: 33.4 % — ABNORMAL LOW (ref 36.0–46.0)
Hemoglobin: 10.8 g/dL — ABNORMAL LOW (ref 12.0–15.0)
Immature Granulocytes: 1 %
Lymphocytes Relative: 25 %
Lymphs Abs: 1.8 10*3/uL (ref 0.7–4.0)
MCH: 28.4 pg (ref 26.0–34.0)
MCHC: 32.3 g/dL (ref 30.0–36.0)
MCV: 87.9 fL (ref 80.0–100.0)
Monocytes Absolute: 0.6 10*3/uL (ref 0.1–1.0)
Monocytes Relative: 8 %
Neutro Abs: 4.2 10*3/uL (ref 1.7–7.7)
Neutrophils Relative %: 60 %
Platelets: 212 10*3/uL (ref 150–400)
RBC: 3.8 MIL/uL — ABNORMAL LOW (ref 3.87–5.11)
RDW: 16.3 % — ABNORMAL HIGH (ref 11.5–15.5)
WBC: 6.9 10*3/uL (ref 4.0–10.5)
nRBC: 0 % (ref 0.0–0.2)

## 2020-09-22 LAB — COMPREHENSIVE METABOLIC PANEL
ALT: 121 U/L — ABNORMAL HIGH (ref 0–44)
AST: 82 U/L — ABNORMAL HIGH (ref 15–41)
Albumin: 3.1 g/dL — ABNORMAL LOW (ref 3.5–5.0)
Alkaline Phosphatase: 554 U/L — ABNORMAL HIGH (ref 38–126)
Anion gap: 9 (ref 5–15)
BUN: 17 mg/dL (ref 8–23)
CO2: 25 mmol/L (ref 22–32)
Calcium: 9 mg/dL (ref 8.9–10.3)
Chloride: 105 mmol/L (ref 98–111)
Creatinine, Ser: 0.77 mg/dL (ref 0.44–1.00)
GFR, Estimated: >60 mL/min (ref >60)
Glucose, Bld: 140 mg/dL — ABNORMAL HIGH (ref 70–99)
Potassium: 4.1 mmol/L (ref 3.5–5.1)
Sodium: 139 mmol/L (ref 135–145)
Total Bilirubin: 5 mg/dL — ABNORMAL HIGH (ref 0.3–1.2)
Total Protein: 6 g/dL — ABNORMAL LOW (ref 6.5–8.1)

## 2020-09-22 LAB — CYTOLOGY - NON PAP

## 2020-09-22 LAB — PHOSPHORUS: Phosphorus: 3.9 mg/dL (ref 2.5–4.6)

## 2020-09-22 LAB — MAGNESIUM: Magnesium: 1.9 mg/dL (ref 1.7–2.4)

## 2020-09-22 LAB — LIPASE, BLOOD: Lipase: 112 U/L — ABNORMAL HIGH (ref 11–51)

## 2020-09-22 MED ORDER — BLOOD GLUCOSE MONITOR KIT: PACK | 0 refills | Status: DC

## 2020-09-22 MED ORDER — INSULIN GLARGINE 100 UNIT/ML SOLOSTAR PEN: 8.0000 [IU] | PEN_INJECTOR | Freq: Every day | SUBCUTANEOUS | 0 refills | Status: DC

## 2020-09-22 NOTE — Discharge Summary (Addendum)
Physician Discharge Summary  Tina Patel:[id number] DOB: 29-Jan-1941 DOA: 09/18/2020  PCP: Tina Morning, DO  Admit date: 09/18/2020 Discharge date: 09/22/2020  Time spent: 40 minutes  Recommendations for Outpatient Follow-up:  1. Follow outpatient CBC/CMP 2. Follow with GI outpatient 3. Follow pending FNA results/surgical path 4. Follow blood sugars on insulin   5. Follow with oncology outpatient   Discharge Diagnoses:  Principal Problem:   Common bile duct dilatation Active Problems:   Hyperbilirubinemia   DM (diabetes mellitus), type 2 (HCC)   HTN (hypertension)   Hypothyroidism   Discharge Condition: stable  Diet recommendation: heart healthy  Filed Weights   09/18/20 1915 09/19/20 1232  Weight: 62.6 kg 62.6 kg    History of present illness:  Tina Laterria [place] Staleyis Tina Patel 80 y.o.femalewith medical history significant forhypertension, hypothyroidism, GERD and type 2 diabetes who [facility] and common bile duct dilatation seen and right upper quadrant ultrasound.  Patient reports that for some time she has felt fatigue and for the past 2 months has noted right upper quadrant and mid abdominal pain that is worse at night.Feelings nauseous butno diarrhea. Notes weight loss of possibly around 20 to 30 pounds since January of this year. Has also noted that her sugar has been elevated up to 300s which prompted her to seek evaluation today. She currently is only on metformin. Denies any tobacco, alcohol or illicit drug use. No family history of pancreatic cancer.  In the ED, she was afebrile normotensive on room air. Total bilirubin elevated up to 17. AST and ALT moderately elevated greater than 200. Alkaline phosphatase of 900. Right upper quadrant ultrasound was done instead of contrast study due to contrast shortage. Showed intrahepatic and extrahepatic biliary duct dilatation. Patient was then transferred to Tina Patel for hospital admission and GI consultation.  She was seen by GI and had ERCP/EUS which was notable for Tina Patel mass in the pancreatic head.  Cytology and FNA were done.  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  CT CAP did not show evidence of metastatic disease.  LFT's were improving at the time of discharge.  Discharged with plan for oncology and gastroenterology follow up.   Hospital Course:  Elevated Liver Enzymes Cholestatic Pattern  Abdominal Discomfort  Weight Loss Mass in pancreatic head ERCP/EUS done -> notable for mass in pancreatic head, FNA done.  Cytology obtained from distal CBD (benign reactive/reparative changes, bland spindle cell fragments) (benign reactive reparative changes). S/p sphincterotomy and stent to CBD Trend LFT's - improving LFT's today MRCP with mass in the had of the pancreas with upstream pancreatic duct dilatation and atrophy (highly concerning for pancreatic adenocarcinoma).  Mild biliary duct dilatation of the common hepaticbile duct and the intrahepatic bile ducts in the L hepatic lobe.  Concern for obstruction of the CBD by the pancreatic head mass.  Multiple cystic lesions throughout the liver parenchyma (favor complex benighn cyst, consider liver protocol CT) CT chest/abd/pelvis with contrast with heterogenous mass of central pancreatic head.  Atrophy of distal pancreatic parnchyma with diffuse pancreatic ductal dilatation.  C/w pancreatic adenocarcinoma.  No evidence metastatic disease in the chest, abdomen, or pelvis.  Numerous lobulated, fluid attenuating cyst throughout hepatic parenchyma, numerous additional subcentimeter lesions too small to characterize.  Thickening and fat stranding about gallbladder as well as about the adjacent duodenal bulb and descending duodenum of uncertain etiology (cholecystitis, enteritis, and/or pancreatitic).  (see report) GI C/s, appreciate recs CA 19-9 elevated Follow  outpatient with  gastroenterology, Dr. Benson Patel Follow outpatient with oncology, referral placed  Ct findings concerning for cholecystitis/enteritis/pancreatitis ? 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 - Tina Patel biliary sphincterotomy was performed. - Cells for cytology obtained distal CBD. - One covered metal stent was placed into the common bile duct. - Return patient to hospital ward for ongoing care. - Await MRCP results.  EUS - Tina Patel mass was identified in the pancreatic head. The staging applies if malignancy is confirmed. Fine needle aspiration performed. - There was dilation in the common hepatic duct which measured up to 13 mm. - There was dilation in the intrahepatic bile ducts, diffusely. - Tina Patel cyst was found in the left lobe of the liver and measured 24 mm by 15 mm. - Endosonographic images of the left adrenal gland were unremarkable. - Await cytology report. - Proceed with the ERCP with stent placement.  Consultations:  Oncology  gastroenterology  Discharge Exam: Vitals:   09/22/20 0420 09/22/20 1319  BP: 102/60 110/60  Pulse: 85 81  Resp: 15 18  Temp: 98.2 F (36.8 C) 98.2 F (36.8 C)  SpO2: 100% 98%   No new complaints Mild abdominal discomfort - this has been present for Tina Patel while (per husband/sister) Discussed d/c plan over phone with daughter  General: No acute distress. Icteric sclera  Cardiovascular: Heart sounds show Tina Patel regular rate, and rhythm. Lungs: Clear to auscultation bilaterally  Abdomen: mild abdominal discomfort (chronic per  discussion) Neurological: Alert and oriented 3. Moves all extremities 4. Cranial nerves II through XII grossly intact. Skin: Warm and dry. No rashes or lesions. Extremities: No clubbing or cyanosis. No edema  Discharge Instructions   Discharge Instructions    Ambulatory referral to Hematology / Oncology   Complete by: As directed    Ambulatory referral to Physical Therapy   Complete by: As directed    Call MD for:  difficulty breathing, headache or visual disturbances   Complete by: As directed    Call MD for:  difficulty breathing, headache or visual disturbances   Complete by: As directed    Call MD for:  extreme fatigue   Complete by: As directed    Call MD for:  extreme fatigue   Complete by: As directed    Call MD for:  hives   Complete by: As directed    Call MD for:  hives   Complete by: As directed    Call MD for:  persistant dizziness or light-headedness   Complete by: As directed    Call MD for:  persistant dizziness or light-headedness   Complete by: As directed    Call MD for:  persistant nausea and vomiting   Complete by: As directed    Call MD for:  persistant nausea and vomiting   Complete by: As directed    Call MD for:  redness, tenderness, or signs of infection (pain, swelling, redness, odor or green/yellow discharge around incision site)   Complete by: As directed    Call MD for:  redness, tenderness, or signs of infection (pain, swelling, redness, odor or green/yellow discharge around incision site)  Complete by: As directed    Call MD for:  severe uncontrolled pain   Complete by: As directed    Call MD for:  severe uncontrolled pain   Complete by: As directed    Call MD for:  temperature >100.4   Complete by: As directed    Call MD for:  temperature >100.4   Complete by: As directed    Diet - low sodium heart healthy   Complete by: As directed    Diet - low sodium heart healthy   Complete by: As directed    Discharge instructions   Complete by:  As directed    You were seen for jaundice (yellowing of the skin) and abdominal discomfort.    You had an ERCP and EUS which was concerning for Teosha Casso pancreatic mass.  You had Daoud Lobue stent placed.  Your had Lorey Pallett biopsy and cytology which are currently pending.  Please follow up with Dr. Benson Patel for follow up.  You may require Hollister Wessler repeat procedure with Dr. Benson Patel.  Your imaging showed Jalonda Antigua pancreatic mass concerning for pancreatic adenocarcinoma.  Please follow up with Dr. Benson Patel as an outpatient.  Please follow up with oncology outpatient as well.   Your blood sugars were elevated.  We'll send you home with insulin to take in addition to your metformin.  Continue to check fasting blood sugars and premeal blood sugars.  Bring Kyira Volkert list of your blood sugars to show your PCP so they can make adjustments as needed.   Stop your pravastatin in the setting of your liver injury.  Follow repeat labs within 1 week.  Avoid tylenol or over the counter medications unless you discuss with your provider.   Return for new, recurrent, or worsening symptoms.  Please ask your PCP to request records from this hospitalization so they know what was done and what the next steps will be.   Increase activity slowly   Complete by: As directed    Increase activity slowly   Complete by: As directed      Allergies as of 09/22/2020   No Known Allergies     Medication List    STOP taking these medications   pravastatin 80 MG tablet Commonly known as: PRAVACHOL     TAKE these medications   amLODipine 5 MG tablet Commonly known as: NORVASC Take 5 mg by mouth daily.   blood glucose meter kit and supplies Kit Dispense based on patient and insurance preference. Use up to four times daily as directed.   calcium carbonate 500 MG chewable tablet Commonly known as: TUMS - dosed in mg elemental calcium Chew 500 mg by mouth daily as needed for indigestion or heartburn.   famotidine 20 MG tablet Commonly known as: PEPCID Take 20 mg by  mouth at bedtime.   furosemide 20 MG tablet Commonly known as: LASIX Take 20 mg by mouth daily.   insulin glargine 100 UNIT/ML Solostar Pen Commonly known as: LANTUS Inject 8 Units into the skin daily.   levothyroxine 75 MCG tablet Commonly known as: SYNTHROID Take 75 mcg by mouth daily.   lisinopril 10 MG tablet Commonly known as: ZESTRIL Take 10 mg by mouth daily.   metFORMIN 500 MG 24 hr tablet Commonly known as: GLUCOPHAGE-XR Take 500 mg by mouth 2 (two) times daily.   Vitamin D (Ergocalciferol) 50 MCG (2000 UT) Caps Take 2,000 Units by mouth daily.      No Known Allergies  Follow-up Information    Tina Morning, DO Follow  up.   Specialty: Family Medicine Contact information: [address] [place] [address] [phone]        [name], MD Follow up.   Specialty: Gastroenterology Contact information: [address], [place] [address] [facility] Follow up.   Specialty: Oncology Contact information: [place] [id number] [place] [phone]               The results of significant diagnostics from this hospitalization (including imaging, microbiology, ancillary and laboratory) are listed below for reference.    Significant Diagnostic Studies: CT CHEST W CONTRAST  Result Date: 09/21/2020 CLINICAL DATA:  Metastatic disease evaluation, pancreatic malignancy EXAM: CT CHEST, ABDOMEN, AND PELVIS WITH CONTRAST TECHNIQUE: Multidetector CT imaging of the chest, abdomen and pelvis was performed following the standard protocol during bolus administration of intravenous contrast. CONTRAST:  48m OMNIPAQUE IOHEXOL 300 MG/ML SOLN, additional oral enteric contrast COMPARISON:  MR abdomen, 09/20/2020 FINDINGS: CT CHEST FINDINGS Cardiovascular: Aortic atherosclerosis. Normal heart size. No pericardial effusion. Mediastinum/Nodes:  No enlarged mediastinal, hilar, or axillary lymph nodes. Thyroid gland, trachea, and esophagus demonstrate no significant findings. Lungs/Pleura: Lungs are clear. No pleural effusion or pneumothorax. Musculoskeletal: No chest wall mass or suspicious bone lesions identified. CT ABDOMEN PELVIS FINDINGS Hepatobiliary: No solid liver abnormality is seen. There are numerous lobulated, fluid attenuating cysts throughout the hepatic parenchyma, in numerous subcentimeter lesions are too small to characterize although demonstrate no evidence of contrast enhancement and are likely additional subcentimeter cysts. Thickening and fat stranding about gallbladder (series 7, image 93). Status post common bile duct stent and post stenting pneumobilia. Pancreas: Heterogeneous mass of the central pancreatic head measuring 4.5 x 3.3 cm (series 7, image 108). There is atrophy of the distal pancreatic parenchyma with diffuse pancreatic ductal dilatation up to 0.8 cm. Mass appears to closely abut the lateral surface of the portal vein near the confluence (series 7, image 100). The superior mesenteric artery is well separated by Hassel Uphoff fat plane (series 2, image 52), as is the hepatic artery (series 2, image 42). Spleen: Normal in size without significant abnormality. Adrenals/Urinary Tract: Adrenal glands are unremarkable. Kidneys are normal, without renal calculi, solid lesion, or hydronephrosis. Bladder is unremarkable. Stomach/Bowel: Stomach is within normal limits. Fat stranding about the duodenal bulb and descending duodenum. Appendix appears normal. No evidence of bowel wall thickening, distention, or inflammatory changes. Descending and sigmoid diverticulosis. Vascular/Lymphatic: Aortic atherosclerosis. No enlarged abdominal or pelvic lymph nodes. Reproductive: No mass or other abnormality. Other: No abdominal wall hernia or abnormality. No abdominopelvic ascites. Musculoskeletal: No acute or significant osseous findings. IMPRESSION: 1.  Heterogeneous mass of the central pancreatic head measuring 4.5 x 3.3 cm. There is atrophy of the distal pancreatic parenchyma with diffuse pancreatic ductal dilatation up to 0.8 cm. Mass appears to closely abut the lateral surface of the portal vein near the confluence. The superior mesenteric artery is well separated by Adriane Guglielmo fat plane, as is the hepatic artery. 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 The exact borders and size of the mass are better delineated by prior MR. 2. No evidence of metastatic disease in the chest, abdomen, or pelvis. 3. There are numerous lobulated, fluid attenuating cysts throughout the hepatic parenchyma, numerous additional subcentimeter lesions are too small to characterize although demonstrate no evidence of contrast enhancement and are likely additional subcentimeter cysts. Attention on follow-up. 4. Status post common bile duct stent with post  stenting pneumobilia. 5. Thickening and fat stranding about the gallbladder as well as about the adjacent duodenal bulb and descending duodenum, of uncertain etiology, concerning for cholecystitis, enteritis, and/or pancreatitis, possibly related to recent endoscopic procedure. Aortic Atherosclerosis (ICD10-I70.0). Electronically Signed   By: Eddie Candle M.D.   On: 09/21/2020 18:08   CT ABDOMEN PELVIS W CONTRAST  Result Date: 09/21/2020 CLINICAL DATA:  Metastatic disease evaluation, pancreatic malignancy EXAM: CT CHEST, ABDOMEN, AND PELVIS WITH CONTRAST TECHNIQUE: Multidetector CT imaging of the chest, abdomen and pelvis was performed following the standard protocol during bolus administration of intravenous contrast. CONTRAST:  26m OMNIPAQUE IOHEXOL 300 MG/ML SOLN, additional oral enteric contrast COMPARISON:  MR abdomen, 09/20/2020 FINDINGS: CT CHEST FINDINGS Cardiovascular: Aortic atherosclerosis. Normal heart size. No pericardial effusion. Mediastinum/Nodes: No enlarged mediastinal, hilar, or axillary lymph nodes.  Thyroid gland, trachea, and esophagus demonstrate no significant findings. Lungs/Pleura: Lungs are clear. No pleural effusion or pneumothorax. Musculoskeletal: No chest wall mass or suspicious bone lesions identified. CT ABDOMEN PELVIS FINDINGS Hepatobiliary: No solid liver abnormality is seen. There are numerous lobulated, fluid attenuating cysts throughout the hepatic parenchyma, in numerous subcentimeter lesions are too small to characterize although demonstrate no evidence of contrast enhancement and are likely additional subcentimeter cysts. Thickening and fat stranding about gallbladder (series 7, image 93). Status post common bile duct stent and post stenting pneumobilia. Pancreas: Heterogeneous mass of the central pancreatic head measuring 4.5 x 3.3 cm (series 7, image 108). There is atrophy of the distal pancreatic parenchyma with diffuse pancreatic ductal dilatation up to 0.8 cm. Mass appears to closely abut the lateral surface of the portal vein near the confluence (series 7, image 100). The superior mesenteric artery is well separated by Justan Gaede fat plane (series 2, image 52), as is the hepatic artery (series 2, image 42). Spleen: Normal in size without significant abnormality. Adrenals/Urinary Tract: Adrenal glands are unremarkable. Kidneys are normal, without renal calculi, solid lesion, or hydronephrosis. Bladder is unremarkable. Stomach/Bowel: Stomach is within normal limits. Fat stranding about the duodenal bulb and descending duodenum. Appendix appears normal. No evidence of bowel wall thickening, distention, or inflammatory changes. Descending and sigmoid diverticulosis. Vascular/Lymphatic: Aortic atherosclerosis. No enlarged abdominal or pelvic lymph nodes. Reproductive: No mass or other abnormality. Other: No abdominal wall hernia or abnormality. No abdominopelvic ascites. Musculoskeletal: No acute or significant osseous findings. IMPRESSION: 1. Heterogeneous mass of the central pancreatic head  measuring 4.5 x 3.3 cm. There is atrophy of the distal pancreatic parenchyma with diffuse pancreatic ductal dilatation up to 0.8 cm. Mass appears to closely abut the lateral surface of the portal vein near the confluence. The superior mesenteric artery is well separated by Bowman Higbie fat plane, as is the hepatic artery. 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 The exact borders and size of the mass are better delineated by prior MR. 2. No evidence of metastatic disease in the chest, abdomen, or pelvis. 3. There are numerous lobulated, fluid attenuating cysts throughout the hepatic parenchyma, numerous additional subcentimeter lesions are too small to characterize although demonstrate no evidence of contrast enhancement and are likely additional subcentimeter cysts. Attention on follow-up. 4. Status post common bile duct stent with post stenting pneumobilia. 5. Thickening and fat stranding about the gallbladder as well as about the adjacent duodenal bulb and descending duodenum, of uncertain etiology, concerning for cholecystitis, enteritis, and/or pancreatitis, possibly related to recent endoscopic procedure. Aortic Atherosclerosis (ICD10-I70.0). Electronically Signed   By: AEddie CandleM.D.   On: 09/21/2020 18:08   MR 3D  Recon At Scanner  Result Date: 09/21/2020 CLINICAL DATA:  Jaundice. Common bile duct obstruction seen on RIGHT upper quadrant ultrasound EXAM: MRI ABDOMEN WITHOUT AND WITH CONTRAST (INCLUDING MRCP) TECHNIQUE: Multiplanar multisequence MR imaging of the abdomen was performed both before and after the administration of intravenous contrast. Heavily T2-weighted images of the biliary and pancreatic ducts were obtained, and three-dimensional MRCP images were rendered by post processing. CONTRAST:  42m GADAVIST GADOBUTROL 1 MMOL/ML IV SOLN COMPARISON:  Ultrasound 09/18/2020 FINDINGS: Exam is degraded respiratory motion. Best images possible. The MRCP images are significantly degraded.  Additionally is IV infiltration therefore no true T1 noncontrast imaging was available Lower chest:  Lung bases are clear. Hepatobiliary: The common hepatic duct and common bile duct are prominent. The common bile duct is upper limits of normal at 6 mm. There is Murat Rideout mass in the pancreatic head which may partially obstructs the common bile duct. There is mild biliary duct dilatation in the LEFT hepatic lobe best seen on delayed contrast imaging (image 32/53). There multiple round cystic lesions in LEFT and RIGHT hepatic lobe which are hyperintense on T2 weighted imaging (series 4). The cysts have no appreciable post-contrast enhancement. Some limitation in contrast enhanced assessment due to patient motion as well as IV infiltration which resulted in no true noncontrast pre contrast imaging. Pancreas: In the head of the pancreas there is Anuhea Gassner hypoenhancing mass measuring 3.2 by 2.9 cm on image 61/series 18. Lesion is well seen as mixed hyperintensity on T2 weighted imaging measuring 2.7 by 2.5 cm on image 26/4. There is significant upstream pancreatic duct dilatation to 8 mm (21/4). There is atrophy of the pancreatic body and tail related to the duct dilatation. Spleen: Normal spleen. Adrenals/urinary tract: Adrenal glands and kidneys are normal. Stomach/Bowel: Stomach and limited of the small bowel is unremarkable Vascular/Lymphatic: Abdominal aortic normal caliber. No retroperitoneal periportal lymphadenopathy. Musculoskeletal: No aggressive osseous lesion IMPRESSION: 1. 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 Recommend ERCP/EUS or tissue sampling. 2. There is mild biliary ductal dilatation of the common hepatic bile duct and the intrahepatic bile ducts in the LEFT hepatic lobe. Sequences are severely degraded by patient motion. Concern for obstruction of the common bile duct by the pancreatic head mass. 3. Multiple cystic  lesions throughout the liver parenchyma. Assessment of enhancement is difficult due to patient motion as well as no true noncontrast T1 weighted imaging as described above. Favor complex benign cysts but consider liver protocol contrast CT for further evaluation of the cystic lesions to exclude metastasis. Electronically Signed   By: SSuzy BouchardM.D.   On: 09/21/2020 07:28   DG Chest Portable 1 View  Result Date: 09/18/2020 CLINICAL DATA:  Chest pain EXAM: PORTABLE CHEST 1 VIEW COMPARISON:  None. FINDINGS: The heart size and mediastinal contours are within normal limits. Aortic atherosclerosis. Both lungs are clear. The visualized skeletal structures are unremarkable. IMPRESSION: No active disease. Electronically Signed   By: KDonavan FoilM.D.   On: 09/18/2020 19:52   DG ERCP BILIARY & PANCREATIC DUCTS  Result Date: 09/19/2020 CLINICAL DATA:  80year old female with biliary obstruction EXAM: ERCP TECHNIQUE: Multiple spot images obtained with the fluoroscopic device and submitted for interpretation post-procedure. FLUOROSCOPY TIME:  Fluoroscopy Time:  3 minutes 10 seconds Radiation Exposure Index (if provided by the fluoroscopic device): 39.4 mGy Number of Acquired Spot Images: 0 COMPARISON:  None. FINDINGS: Five intraoperative spot images are submitted for review.  The images demonstrate Nakyra Bourn flexible duodenal scope in the descending duodenum followed by wire cannulation of the left intrahepatic ducts. Cholangiogram demonstrates marked dilation of the intra and extrahepatic ducts with significant narrowing of the mid and distal common duct. Ultimately, Gerrod Maule self expanding metallic stent was placed. There appears to be good drainage of the biliary system on the final image. IMPRESSION: 1. Mid and distal biliary duct obstruction. 2. Successful placement of self expanding metallic biliary stent. These images were submitted for radiologic interpretation only. Please see the procedural report for the amount of  contrast and the fluoroscopy time utilized. Electronically Signed   By: Jacqulynn Cadet M.D.   On: 09/19/2020 15:05   MR ABDOMEN MRCP W WO CONTAST  Result Date: 09/21/2020 CLINICAL DATA:  Jaundice. Common bile duct obstruction seen on RIGHT upper quadrant ultrasound EXAM: MRI ABDOMEN WITHOUT AND WITH CONTRAST (INCLUDING MRCP) TECHNIQUE: Multiplanar multisequence MR imaging of the abdomen was performed both before and after the administration of intravenous contrast. Heavily T2-weighted images of the biliary and pancreatic ducts were obtained, and three-dimensional MRCP images were rendered by post processing. CONTRAST:  60m GADAVIST GADOBUTROL 1 MMOL/ML IV SOLN COMPARISON:  Ultrasound 09/18/2020 FINDINGS: Exam is degraded respiratory motion. Best images possible. The MRCP images are significantly degraded. Additionally is IV infiltration therefore no true T1 noncontrast imaging was available Lower chest:  Lung bases are clear. Hepatobiliary: The common hepatic duct and common bile duct are prominent. The common bile duct is upper limits of normal at 6 mm. There is Venisha Boehning mass in the pancreatic head which may partially obstructs the common bile duct. There is mild biliary duct dilatation in the LEFT hepatic lobe best seen on delayed contrast imaging (image 32/53). There multiple round cystic lesions in LEFT and RIGHT hepatic lobe which are hyperintense on T2 weighted imaging (series 4). The cysts have no appreciable post-contrast enhancement. Some limitation in contrast enhanced assessment due to patient motion as well as IV infiltration which resulted in no true noncontrast pre contrast imaging. Pancreas: In the head of the pancreas there is Laycie Schriner hypoenhancing mass measuring 3.2 by 2.9 cm on image 61/series 18. Lesion is well seen as mixed hyperintensity on T2 weighted imaging measuring 2.7 by 2.5 cm on image 26/4. There is significant upstream pancreatic duct dilatation to 8 mm (21/4). There is atrophy of the  pancreatic body and tail related to the duct dilatation. Spleen: Normal spleen. Adrenals/urinary tract: Adrenal glands and kidneys are normal. Stomach/Bowel: Stomach and limited of the small bowel is unremarkable Vascular/Lymphatic: Abdominal aortic normal caliber. No retroperitoneal periportal lymphadenopathy. Musculoskeletal: No aggressive osseous lesion IMPRESSION: 1. 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 Recommend ERCP/EUS or tissue sampling. 2. There is mild biliary ductal dilatation of the common hepatic bile duct and the intrahepatic bile ducts in the LEFT hepatic lobe. Sequences are severely degraded by patient motion. Concern for obstruction of the common bile duct by the pancreatic head mass. 3. Multiple cystic lesions throughout the liver parenchyma. Assessment of enhancement is difficult due to patient motion as well as no true noncontrast T1 weighted imaging as described above. Favor complex benign cysts but consider liver protocol contrast CT for further evaluation of the cystic lesions to exclude metastasis. Electronically Signed   By: SSuzy BouchardM.D.   On: 09/21/2020 07:28   [place]Abdomen Limited RUQ (LIVER/GB)  Result Date: 09/18/2020 CLINICAL DATA:  Jaundice EXAM: ULTRASOUND ABDOMEN LIMITED  RIGHT UPPER QUADRANT COMPARISON:  None. FINDINGS: Gallbladder: Sludge seen within the gallbladder. Gallbladder wall is thickened at 5 mm. Small amount of pericholecystic fluid. Common bile duct: Diameter: Dilated measuring up to 13 mm. Liver: Intrahepatic biliary ductal dilatation. Multiple cysts throughout the liver measuring up to 4.6 cm. Complex area posteriorly in the right hepatic lobe measures up to 3.6 cm which could reflect Toney Lizaola complex septated cyst. Portal vein is patent on color Doppler imaging with normal direction of blood flow towards the liver. Other: None. IMPRESSION: Intrahepatic and  extrahepatic biliary ductal dilatation. Common bile duct dilated up to 13 mm. This could be further evaluated with MRCP or ERCP. Sludge within the gallbladder with associated gallbladder wall thickening and pericholecystic fluid. Cannot exclude acute cholecystitis. Multiple hepatic cysts. Complex cystic area posteriorly in the right hepatic lobe. This could also be further evaluated with MRI. Electronically Signed   By: Rolm Baptise M.D.   On: 09/18/2020 21:52    Microbiology: Recent Results (from the past 240 hour(s))  Resp Panel by RT-PCR (Flu Onetha Gaffey&B, Covid) Nasopharyngeal Swab     Status: None   Collection Time: 09/18/20 10:31 PM   Specimen: Nasopharyngeal Swab; Nasopharyngeal(NP) swabs in vial transport medium  Result Value Ref Range Status   SARS Coronavirus 2 by RT PCR NEGATIVE NEGATIVE Final    Comment: (NOTE) SARS-CoV-2 target nucleic acids are NOT DETECTED.  The SARS-CoV-2 RNA is generally detectable in upper respiratory specimens during the acute phase of infection. The lowest concentration of SARS-CoV-2 viral copies this assay can detect is 138 copies/mL. Charell Faulk negative result does not preclude SARS-Cov-2 infection and should not be used as the sole basis for treatment or other patient management decisions. Hovanes Hymas negative result may occur with  improper specimen collection/handling, submission of specimen other than nasopharyngeal swab, presence of viral mutation(s) within the areas targeted by this assay, and inadequate number of viral copies(<138 copies/mL). Terika Pillard negative result must be combined with clinical observations, patient history, and epidemiological information. The expected result is Negative.  Fact Sheet for Patients:  EntrepreneurPulse.com.au  Fact Sheet for Healthcare Providers:  IncredibleEmployment.be  This test is no t yet approved or cleared by the [place] FDA and  has been authorized for detection and/or diagnosis of  SARS-CoV-2 by FDA under an Emergency Use Authorization (EUA). This EUA will remain  in effect (meaning this test can be used) for the duration of the COVID-19 declaration under Section 564(b)(1) of the Act, 21 U.S.C.section 360bbb-3(b)(1), unless the authorization is terminated  or revoked sooner.       Influenza Muhamed Luecke by PCR NEGATIVE NEGATIVE Final   Influenza B by PCR NEGATIVE NEGATIVE Final    Comment: (NOTE) The Xpert Xpress SARS-CoV-2/FLU/RSV plus assay is intended as an aid in the diagnosis of influenza from Nasopharyngeal swab specimens and should not be used as Mel Tadros sole basis for treatment. Nasal washings and aspirates are unacceptable for Xpert Xpress SARS-CoV-2/FLU/RSV testing.  Fact Sheet for Patients: EntrepreneurPulse.com.au  Fact Sheet for Healthcare Providers: IncredibleEmployment.be  This test is not yet approved or cleared by the [place] FDA and has been authorized for detection and/or diagnosis of SARS-CoV-2 by FDA under an Emergency Use Authorization (EUA). This EUA will remain in effect (meaning this test can be used) for the duration of the COVID-19 declaration under Section 564(b)(1) of the Act, 21 U.S.C. section 360bbb-3(b)(1), unless the authorization is terminated or revoked.  Performed at [facility], [address], [place], [place] [address]  Labs: Basic Metabolic Panel: Recent Labs  Lab 09/18/20 1929 09/19/20 0128 09/20/20 0443 09/21/20 0424 09/22/20 0459  NA 134* 137 137 139 139  K 3.9 3.7 4.5 3.6 4.1  CL 97* 107 107 105 105  CO2 26 21* _0 GLUCOSE 238* 191* 166* 160* 140*  BUN _1 CREATININE 0.96 0.48 0.57 0.68 0.77  CALCIUM 9.8 8.9 8.9 8.8* 9.0  MG  --   --  2.0 1.9 1.9  PHOS  --   --  2.8 3.9 3.9   Liver Function Tests: Recent Labs  Lab 09/18/20 1929 09/19/20 0128 09/20/20 0443 09/21/20 0424 09/22/20 0459  AST 234* 225* 152* 106* 82*  ALT 226*  200* 172* 141* 121*  ALKPHOS 918* 797* 693* 621* 554*  BILITOT 17.4* 14.5* 8.8* 7.1* 5.0*  PROT 6.8 6.0* 5.8* 6.0* 6.0*  ALBUMIN 4.3 3.2* 2.9* 3.1* 3.1*   Recent Labs  Lab 09/18/20 1929 09/22/20 0459  LIPASE 127* 112*   Recent Labs  Lab 09/18/20 1929  AMMONIA 55*   CBC: Recent Labs  Lab 09/18/20 1929 09/20/20 0443 09/21/20 0424 09/22/20 0459  WBC 4.3 7.4 8.0 6.9  NEUTROABS 2.2 6.1 5.9 4.2  HGB 12.4 10.7* 10.6* 10.8*  HCT 36.9 32.5* 32.7* 33.4*  MCV 84.4 86.2 87.2 87.9  PLT 226 212 211 212   Cardiac Enzymes: No results for input(s): CKTOTAL, CKMB, CKMBINDEX, TROPONINI in the last 168 hours. BNP: BNP (last 3 results) No results for input(s): BNP in the last 8760 hours.  ProBNP (last 3 results) No results for input(s): PROBNP in the last 8760 hours.  CBG: Recent Labs  Lab 09/21/20 1224 09/21/20 2158 09/22/20 0238 09/22/20 0721 09/22/20 1316  GLUCAP 372* 283* 160* 155* 260*       Signed:  Fayrene Helper MD.  [facility] 09/22/2020, 6:08 PM

## 2020-09-22 NOTE — Progress Notes (Signed)
Reviewed d/c instructions w pt and both daughters, many questions answered and they all verbalized understanding. D/C per w/c w all belongings in stable condition.

## 2020-09-22 NOTE — Evaluation (Signed)
Physical Therapy Evaluation Patient Details Name: Tina Patel MRN: [id number] DOB: February 12, 1941 Today's Date: 09/22/2020   History of Present Illness  Patient is a 80 y.o. female who presents as a transfer from [facility] ED for hyperbilirubinemia and common bile duct dilatation seen and right upper quadrant ultrasound. PMH significant for hypertension, hypothyroidism, GERD, DM.    Clinical Impression  Tina Patel is 80 y.o. female admitted with above HPI and diagnosis. Patient is currently limited by functional impairments below (see PT problem list). Patient lives with her husband and is independent at baseline. Patient evaluated by Physical Therapy with no further acute PT needs identified. All education has been completed and the patient has no further questions. Patient is mobilizing at Mod Independent/Supervision level for mobility and is safe to mobilize in room with family and hallway with RN staff. See below for any follow-up Physical Therapy or equipment needs. PT is signing off. Thank you for this referral.     Follow Up Recommendations Outpatient PT    Equipment Recommendations  None recommended by PT    Recommendations for Other Services       Precautions / Restrictions Precautions Precautions: Fall Restrictions Weight Bearing Restrictions: No      Mobility  Bed Mobility Overal bed mobility: Modified Independent             General bed mobility comments: extra time needed and HOB elevated    Transfers Overall transfer level: Modified independent Equipment used: None             General transfer comment: bil UE's use for power up, extra time.  Ambulation/Gait Ambulation/Gait assistance: Supervision Gait Distance (Feet): 360 Feet Assistive device: None Gait Pattern/deviations: Step-through pattern Gait velocity: fair   General Gait Details: pt ambulate length of hallway with supervision for safety. no overt LOB noted. pt steady and demonstrates  safe negotiation of obstacles.  Stairs            Wheelchair Mobility    Modified Rankin (Stroke Patients Only)       Balance Overall balance assessment: Needs assistance Sitting-balance support: Feet supported Sitting balance-Leahy Scale: Normal     Standing balance support: During functional activity;No upper extremity supported Standing balance-Leahy Scale: Good                               Pertinent Vitals/Pain Pain Assessment: No/denies pain    Home Living Family/patient expects to be discharged to:: Private residence Living Arrangements: Spouse/significant other Available Help at Discharge: Family Type of Home: House Home Access: Stairs to enter Entrance Stairs-Rails: Psychiatric nurse of Steps: 6 Home Layout: One level Home Equipment: Environmental consultant - 2 wheels;Bedside commode;Cane - single point;Shower seat (sister had all DME) Additional Comments: pt lives with spouse and has family close by    Prior Function Level of Independence: Independent         Comments: Independent with all mobility/ADL's, pt stills drives and does her own grocery shopping.     Hand Dominance        Extremity/Trunk Assessment   Upper Extremity Assessment Upper Extremity Assessment: Overall WFL for tasks assessed    Lower Extremity Assessment Lower Extremity Assessment: Overall WFL for tasks assessed    Cervical / Trunk Assessment Cervical / Trunk Assessment: Normal  Communication   Communication: HOH  Cognition Arousal/Alertness: Awake/alert Behavior During Therapy: WFL for tasks assessed/performed Overall Cognitive Status: Within Functional Limits for tasks  assessed                                        General Comments      Exercises     Assessment/Plan    PT Assessment Patent does not need any further PT services  PT Problem List         PT Treatment Interventions      PT Goals (Current goals can be found  in the Care Plan section)  Acute Rehab PT Goals Patient Stated Goal: get home and well again PT Goal Formulation: All assessment and education complete, DC therapy Time For Goal Achievement: 09/22/20 Potential to Achieve Goals: Good    Frequency     Barriers to discharge        Co-evaluation               AM-PAC PT "6 Clicks" Mobility  Outcome Measure Help needed turning from your back to your side while in a flat bed without using bedrails?: None Help needed moving from lying on your back to sitting on the side of a flat bed without using bedrails?: None Help needed moving to and from a bed to a chair (including a wheelchair)?: None Help needed standing up from a chair using your arms (e.g., wheelchair or bedside chair)?: None Help needed to walk in hospital room?: A Little Help needed climbing 3-5 steps with a railing? : A Little 6 Click Score: 22    End of Session Equipment Utilized During Treatment: Gait belt Activity Tolerance: Patient tolerated treatment well Patient left: with call bell/phone within reach;in bed;with family/visitor present Nurse Communication: Mobility status PT Visit Diagnosis: Difficulty in walking, not elsewhere classified (R26.2);Muscle weakness (generalized) (M62.81)    Time: 0109-3235 PT Time Calculation (min) (ACUTE ONLY): 15 min   Charges:   PT Evaluation $PT Eval Low Complexity: 1 Low          Verner Mould, DPT Acute Rehabilitation Services Office [phone] Pager [phone]    Jacques Navy 09/22/2020, 1:30 PM

## 2020-09-23 NOTE — Progress Notes (Signed)
Spoke with patient's husband Irelynd Zumstein regarding referral we received from [facility].  I offered them appointment with medical oncologist Dr. [name] for Monday 09/29/2020 at 3 pm to arrive by 2:40 pm.  He has agreed to this time.  He is aware of our location and that [facility] parking is available.  He also knows he can come with her to this appointment (one person only).

## 2020-09-24 ENCOUNTER — Other Ambulatory Visit: Payer: Self-pay | Admitting: Gastroenterology

## 2020-09-24 ENCOUNTER — Encounter (HOSPITAL_COMMUNITY): Payer: Self-pay | Admitting: Gastroenterology

## 2020-09-24 ENCOUNTER — Other Ambulatory Visit: Payer: Self-pay

## 2020-09-24 NOTE — Progress Notes (Addendum)
[place]   [phone] Fax:([phone] [place] Note   Patient Care Team: [name], DO as PCP - General (Family Medicine) [name], RN as Oncology Nurse Navigator [name], MD as Consulting Physician (Oncology) [name], MD as Consulting Physician (Gastroenterology)  Date of Service:  09/29/2020   CHIEF COMPLAINTS/PURPOSE OF CONSULTATION:  Newly Diagnosed Pancreatic cancer  REFERRING PHYSICIAN:  Dr Benson Norway  Oncology History Overview Note  Cancer Staging Pancreatic cancer [facility]) Staging form: Exocrine Pancreas, AJCC 8th Edition - Clinical stage from 09/26/2020: Stage IB (cT2, cN0, cM0) - Signed by [name], MD on 09/29/2020 Stage prefix: Initial diagnosis Total positive nodes: 0    Pancreatic cancer ([place])  09/18/2020 Imaging   US Abdomen  IMPRESSION: Intrahepatic and extrahepatic biliary ductal dilatation. Common bile duct dilated up to 13 mm. This could be further evaluated with MRCP or ERCP.   Sludge within the gallbladder with associated gallbladder wall thickening and pericholecystic fluid. Cannot exclude acute cholecystitis.   Multiple hepatic cysts. Complex cystic area posteriorly in the right hepatic lobe. This could also be further evaluated with MRI.   09/19/2020 Procedure   EUS by Dr hung  IMPRESSION - A mass was identified in the pancreatic head. The staging applies if malignancy is confirmed. Fine needle aspiration performed. - There was dilation in the common hepatic duct which measured up to 13 mm. - There was dilation in the intrahepatic bile ducts, diffusely. - A cyst was found in the left lobe of the liver and measured 24 mm by 15 mm. - Endosonographic images of the left adrenal gland were unremarkable.    A. PANCREAS, HEAD, FINE NEEDLE ASPIRATION:    FINAL MICROSCOPIC DIAGNOSIS:  - Benign reactive/reparative changes  - Bland spindle cell fragments   SPECIMEN ADEQUACY:  Satisfactory  for evaluation   IMMEDIATE EVALUATION:  SPINDLE FRAGMENTS AND BENIGN GLANDULAR FRAGMENTS. NO DEFINITIVE  MALIGNANCY. ([facility])   DIAGNOSTIC COMMENTS:  There are fragments of bland spindle cells, favored to represent smooth  muscle tissue. The glandular fragments are also bland and thus, there is  no definitive malignancy identified   B. BILIARY, STRICTURE, BRUSHING:    FINAL MICROSCOPIC DIAGNOSIS:  - Benign reactive/reparative changes   09/19/2020 Procedure   ERCP by Dr Benson Norway  IMPRESSION - The major papilla appeared normal. - A biliary sphincterotomy was performed. - Cells for cytology obtained distal CBD. - One covered metal stent was placed into the common bile duct.   09/20/2020 Imaging   MRI Abdomen  IMPRESSION: 1. 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 Recommend ERCP/EUS or tissue sampling. 2. There is mild biliary ductal dilatation of the common hepatic bile duct and the intrahepatic bile ducts in the LEFT hepatic lobe. Sequences are severely degraded by patient motion. Concern for obstruction of the common bile duct by the pancreatic head mass. 3. Multiple cystic lesions throughout the liver parenchyma. Assessment of enhancement is difficult due to patient motion as well as no true noncontrast T1 weighted imaging as described above. Favor complex benign cysts but consider liver protocol contrast CT for further evaluation of the cystic lesions to exclude metastasis.   09/21/2020 Imaging   CT CAP  IMPRESSION: 1. Heterogeneous mass of the central pancreatic head measuring 4.5 x 3.3 cm. There is atrophy of the distal pancreatic parenchyma with diffuse pancreatic ductal dilatation up to 0.8 cm. Mass appears to closely  abut the lateral surface of the portal vein near the confluence. The superior mesenteric artery is well separated by a fat plane, as is the hepatic artery.  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 The exact borders and size of the mass are better delineated by prior MR. 2. No evidence of metastatic disease in the chest, abdomen, or pelvis. 3. There are numerous lobulated, fluid attenuating cysts throughout the hepatic parenchyma, numerous additional subcentimeter lesions are too small to characterize although demonstrate no evidence of contrast enhancement and are likely additional subcentimeter cysts. Attention on follow-up. 4. Status post common bile duct stent with post stenting pneumobilia. 5. Thickening and fat stranding about the gallbladder as well as about the adjacent duodenal bulb and descending duodenum, of uncertain etiology, concerning for cholecystitis, enteritis, and/or pancreatitis, possibly related to recent endoscopic procedure.   Aortic Atherosclerosis (ICD10-I70.0).   09/26/2020 Cancer Staging   Staging form: Exocrine Pancreas, AJCC 8th Edition - Clinical stage from 09/26/2020: Stage IIA (cT3, cN0, cM0) - Signed by [name], MD on 09/29/2020  Stage prefix: Initial diagnosis  Total positive nodes: 0    09/26/2020 Procedure   EUS by Dr Benson Norway  IMPRESSION - A mass was identified in the pancreatic head. Cytology results are pending. However, the endosonographic appearance is consistent with adenocarcinoma. Fine needle aspiration performed.   09/26/2020 Initial Biopsy   A. PANCREAS, HEAD, FINE NEEDLE ASPIRATION:    FINAL MICROSCOPIC DIAGNOSIS:  - Malignant cells consistent with adenocarcinoma    09/29/2020 Initial Diagnosis   Pancreatic cancer (H[facility])       HISTORY OF PRESENTING ILLNESS:  Tina Patel 80 y.o. female is a here because of pancreatic cancer. The patient was referred by Dr Benson Norway. The patient presents to the clinic today accompanied by her daughter Tina Patel. 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   She went to ED on 09/18/20 due to high BG in 300 range, possibly due to UTI. She was seen to have mass  blocking her liver. She did lose nearly 25 pounds in recent months. She did have yellowing of her eyes and skin darkening which was not pinpointed to jaundice. Her urine showed orange and labs showed elevated bilirubin. Dr Benson Norway placed stent which significantly helped her side effects. She had RUQ pain mostly at night from her liver and was recently started on Tramadol which greatly helps. She is still active with housework, but not as much as before. She pushes herself to be up more than 50% of time.   She has a PMHx of DM, HTN, Hypothyroidism, on medication. I reviewed her medication list with her. Her sister had bone cancer. Her son had prostate cancer at age 1. Her mother had lung cancer from factory work and smoking.   Socially she is married who she lives with and has 3 adult children who live in [place]. She denies drinking or smoking.    REVIEW OF SYSTEMS:    Constitutional: Denies fevers, chills or abnormal night sweats Eyes: Denies blurriness of vision, double vision or watery eyes Ears, nose, mouth, throat, and face: Denies mucositis or sore throat Respiratory: Denies cough, dyspnea or wheezes Cardiovascular: Denies palpitation, chest discomfort or lower extremity swelling Gastrointestinal:  Denies nausea, heartburn or change in bowel habits (+) RUQ pain  Skin: Denies abnormal skin rashes Lymphatics: Denies new lymphadenopathy or easy bruising Neurological:Denies numbness, tingling or new weaknesses Behavioral/Psych: Mood is stable, no new changes  All other systems were reviewed with the patient and are negative.   MEDICAL  HISTORY:  Past Medical History:  Diagnosis Date   Diabetes ([place])    Diabetes mellitus    High cholesterol    Hypertension    Hypothyroid     SURGICAL HISTORY: Past Surgical History:  Procedure Laterality Date   BILIARY BRUSHING  09/19/2020   Procedure: BILIARY BRUSHING;  Surgeon: [name], MD;  Location: [facility];  Service: Endoscopy;;    BILIARY STENT PLACEMENT N/A 09/19/2020   Procedure: BILIARY STENT PLACEMENT;  Surgeon: [name], MD;  Location: [facility];  Service: Endoscopy;  Laterality: N/A;   ERCP N/A 09/19/2020   Procedure: ENDOSCOPIC RETROGRADE CHOLANGIOPANCREATOGRAPHY (ERCP);  Surgeon: [name], MD;  Location: [facility];  Service: Endoscopy;  Laterality: N/A;   ESOPHAGOGASTRODUODENOSCOPY (EGD) WITH PROPOFOL N/A 09/19/2020   Procedure: ESOPHAGOGASTRODUODENOSCOPY (EGD) WITH PROPOFOL;  Surgeon: [name], MD;  Location: [facility];  Service: Endoscopy;  Laterality: N/A;   ESOPHAGOGASTRODUODENOSCOPY (EGD) WITH PROPOFOL N/A 09/26/2020   Procedure: ESOPHAGOGASTRODUODENOSCOPY (EGD) WITH PROPOFOL;  Surgeon: [name], MD;  Location: [facility];  Service: Endoscopy;  Laterality: N/A;   EUS N/A 09/19/2020   Procedure: UPPER ENDOSCOPIC ULTRASOUND (EUS) LINEAR;  Surgeon: [name], MD;  Location: [facility];  Service: Endoscopy;  Laterality: N/A;   FINE NEEDLE ASPIRATION  09/19/2020   Procedure: FINE NEEDLE ASPIRATION;  Surgeon: [name], MD;  Location: [facility];  Service: Endoscopy;;   FINE NEEDLE ASPIRATION N/A 09/26/2020   Procedure: FINE NEEDLE ASPIRATION (FNA) LINEAR;  Surgeon: [name], MD;  Location: [facility];  Service: Endoscopy;  Laterality: N/A;   FINGER SURGERY     SPHINCTEROTOMY  09/19/2020   Procedure: SPHINCTEROTOMY;  Surgeon: [name], MD;  Location: [facility];  Service: Endoscopy;;   UPPER ESOPHAGEAL ENDOSCOPIC ULTRASOUND (EUS) N/A 09/26/2020   Procedure: UPPER ESOPHAGEAL ENDOSCOPIC ULTRASOUND (EUS);  Surgeon: [name], MD;  Location: [facility];  Service: Endoscopy;  Laterality: N/A;    SOCIAL HISTORY: Social History   Socioeconomic History   Marital status: Married    Spouse name: Not on file   Number of children: 3   Years of education: Not on file   Highest education level: Not on file  Occupational History   Not on file  Tobacco Use   Smoking  status: Never   Smokeless tobacco: Not on file  Substance and Sexual Activity   Alcohol use: No   Drug use: No   Sexual activity: Not on file  Other Topics Concern   Not on file  Social History Narrative   Not on file   Social Determinants of Health   Financial Resource Strain: Not on file  Food Insecurity: Not on file  Transportation Needs: Not on file  Physical Activity: Not on file  Stress: Not on file  Social Connections: Not on file  Intimate Partner Violence: Not on file    FAMILY HISTORY: Family History  Problem Relation Age of Onset   Cancer Mother        lung cancer   Cancer Maternal Aunt        unknown type cancer with bone mets   Cancer Son 16       prostate cancer    ALLERGIES:  has No Known Allergies.  MEDICATIONS:  Current Outpatient Medications  Medication Sig Dispense Refill   mirtazapine (REMERON) 7.5 MG tablet Take 1 tablet (7.5 mg total) by mouth at bedtime. 30 tablet 0   amLODipine (NORVASC) 5 MG tablet Take 5 mg by mouth daily.     blood glucose  meter kit and supplies KIT Dispense based on patient and insurance preference. Use up to four times daily as directed. 1 each 0   calcium carbonate (TUMS - DOSED IN MG ELEMENTAL CALCIUM) 500 MG chewable tablet Chew 500 mg by mouth daily as needed for indigestion or heartburn.     famotidine (PEPCID) 20 MG tablet Take 20 mg by mouth at bedtime.     furosemide (LASIX) 20 MG tablet Take 20 mg by mouth daily.     insulin glargine (LANTUS) 100 UNIT/ML Solostar Pen Inject 8 Units into the skin daily. 15 mL 0   levothyroxine (SYNTHROID) 75 MCG tablet Take 75 mcg by mouth daily before breakfast.     lisinopril (ZESTRIL) 10 MG tablet Take 10 mg by mouth daily.     metFORMIN (GLUCOPHAGE-XR) 500 MG 24 hr tablet Take 500 mg by mouth 2 (two) times daily.     traMADol (ULTRAM) 50 MG tablet Take 50 mg by mouth at bedtime.     Vitamin D, Ergocalciferol, 50 MCG (2000 UT) CAPS Take 2,000 Units by mouth daily.     No  current facility-administered medications for this visit.    PHYSICAL EXAMINATION: ECOG PERFORMANCE STATUS: 2 - Symptomatic, <50% confined to bed  Vitals:   09/29/20 1459  BP: 110/88  Pulse: 69  Resp: 18  Temp: 97.7 F (36.5 C)  SpO2: 100%   Filed Weights   09/29/20 1459  Weight: 137 lb 14.4 oz (62.6 kg)    GENERAL:alert, no distress and comfortable SKIN: skin color, texture, turgor are normal, no rashes or significant lesions EYES: normal, Conjunctiva are pink and non-injected, sclera clear (+) Minimal jaundice of eyes  NECK: supple, thyroid normal size, non-tender, without nodularity LYMPH:  no palpable lymphadenopathy in the cervical, axillary  LUNGS: clear to auscultation and percussion with normal breathing effort HEART: regular rate & rhythm and no murmurs and no lower extremity edema ABDOMEN:abdomen soft, non-tender and normal bowel sounds Musculoskeletal:no cyanosis of digits and no clubbing  NEURO: alert & oriented x 3 with fluent speech, no focal motor/sensory deficits  LABORATORY DATA:  I have reviewed the data as listed CBC Latest Ref Rng & Units 09/22/2020 09/21/2020 09/20/2020  WBC 4.0 - 10.5 K/uL 6.9 8.0 7.4  Hemoglobin 12.0 - 15.0 g/dL 10.8(L) 10.6(L) 10.7(L)  Hematocrit 36.0 - 46.0 % 33.4(L) 32.7(L) 32.5(L)  Platelets 150 - 400 K/uL 212 211 212    CMP Latest Ref Rng & Units 09/22/2020 09/21/2020 09/20/2020  Glucose 70 - 99 mg/dL 140(H) 160(H) 166(H)  BUN 8 - 23 mg/dL _0 Creatinine 0.44 - 1.00 mg/dL 0.77 0.68 0.57  Sodium 135 - 145 mmol/L 139 139 137  Potassium 3.5 - 5.1 mmol/L 4.1 3.6 4.5  Chloride 98 - 111 mmol/L 105 105 107  CO2 22 - 32 mmol/L _1 Calcium 8.9 - 10.3 mg/dL 9.0 8.8(L) 8.9  Total Protein 6.5 - 8.1 g/dL 6.0(L) 6.0(L) 5.8(L)  Total Bilirubin 0.3 - 1.2 mg/dL 5.0(H) 7.1(H) 8.8(H)  Alkaline Phos 38 - 126 U/L 554(H) 621(H) 693(H)  AST 15 - 41 U/L 82(H) 106(H) 152(H)  ALT 0 - 44 U/L 121(H) 141(H) 172(H)     RADIOGRAPHIC STUDIES: I  have personally reviewed the radiological images as listed and agreed with the findings in the report. CT CHEST W CONTRAST  Result Date: 09/21/2020 CLINICAL DATA:  Metastatic disease evaluation, pancreatic malignancy EXAM: CT CHEST, ABDOMEN, AND PELVIS WITH CONTRAST TECHNIQUE: Multidetector CT imaging of the chest,  abdomen and pelvis was performed following the standard protocol during bolus administration of intravenous contrast. CONTRAST:  36m OMNIPAQUE IOHEXOL 300 MG/ML SOLN, additional oral enteric contrast COMPARISON:  MR abdomen, 09/20/2020 FINDINGS: CT CHEST FINDINGS Cardiovascular: Aortic atherosclerosis. Normal heart size. No pericardial effusion. Mediastinum/Nodes: No enlarged mediastinal, hilar, or axillary lymph nodes. Thyroid gland, trachea, and esophagus demonstrate no significant findings. Lungs/Pleura: Lungs are clear. No pleural effusion or pneumothorax. Musculoskeletal: No chest wall mass or suspicious bone lesions identified. CT ABDOMEN PELVIS FINDINGS Hepatobiliary: No solid liver abnormality is seen. There are numerous lobulated, fluid attenuating cysts throughout the hepatic parenchyma, in numerous subcentimeter lesions are too small to characterize although demonstrate no evidence of contrast enhancement and are likely additional subcentimeter cysts. Thickening and fat stranding about gallbladder (series 7, image 93). Status post common bile duct stent and post stenting pneumobilia. Pancreas: Heterogeneous mass of the central pancreatic head measuring 4.5 x 3.3 cm (series 7, image 108). There is atrophy of the distal pancreatic parenchyma with diffuse pancreatic ductal dilatation up to 0.8 cm. Mass appears to closely abut the lateral surface of the portal vein near the confluence (series 7, image 100). The superior mesenteric artery is well separated by a fat plane (series 2, image 52), as is the hepatic artery (series 2, image 42). Spleen: Normal in size without significant abnormality.  Adrenals/Urinary Tract: Adrenal glands are unremarkable. Kidneys are normal, without renal calculi, solid lesion, or hydronephrosis. Bladder is unremarkable. Stomach/Bowel: Stomach is within normal limits. Fat stranding about the duodenal bulb and descending duodenum. Appendix appears normal. No evidence of bowel wall thickening, distention, or inflammatory changes. Descending and sigmoid diverticulosis. Vascular/Lymphatic: Aortic atherosclerosis. No enlarged abdominal or pelvic lymph nodes. Reproductive: No mass or other abnormality. Other: No abdominal wall hernia or abnormality. No abdominopelvic ascites. Musculoskeletal: No acute or significant osseous findings. IMPRESSION: 1. Heterogeneous mass of the central pancreatic head measuring 4.5 x 3.3 cm. There is atrophy of the distal pancreatic parenchyma with diffuse pancreatic ductal dilatation up to 0.8 cm. Mass appears to closely abut the lateral surface of the portal vein near the confluence. The superior mesenteric artery is well separated by a fat plane, as is the hepatic artery. 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 The exact borders and size of the mass are better delineated by prior MR. 2. No evidence of metastatic disease in the chest, abdomen, or pelvis. 3. There are numerous lobulated, fluid attenuating cysts throughout the hepatic parenchyma, numerous additional subcentimeter lesions are too small to characterize although demonstrate no evidence of contrast enhancement and are likely additional subcentimeter cysts. Attention on follow-up. 4. Status post common bile duct stent with post stenting pneumobilia. 5. Thickening and fat stranding about the gallbladder as well as about the adjacent duodenal bulb and descending duodenum, of uncertain etiology, concerning for cholecystitis, enteritis, and/or pancreatitis, possibly related to recent endoscopic procedure. Aortic Atherosclerosis (ICD10-I70.0). Electronically Signed   By: AEddie CandleM.D.   On: 09/21/2020 18:08   CT ABDOMEN PELVIS W CONTRAST  Result Date: 09/21/2020 CLINICAL DATA:  Metastatic disease evaluation, pancreatic malignancy EXAM: CT CHEST, ABDOMEN, AND PELVIS WITH CONTRAST TECHNIQUE: Multidetector CT imaging of the chest, abdomen and pelvis was performed following the standard protocol during bolus administration of intravenous contrast. CONTRAST:  868mOMNIPAQUE IOHEXOL 300 MG/ML SOLN, additional oral enteric contrast COMPARISON:  MR abdomen, 09/20/2020 FINDINGS: CT CHEST FINDINGS Cardiovascular: Aortic atherosclerosis. Normal heart size. No pericardial effusion. Mediastinum/Nodes: No enlarged mediastinal, hilar, or axillary lymph nodes. Thyroid gland, trachea,  and esophagus demonstrate no significant findings. Lungs/Pleura: Lungs are clear. No pleural effusion or pneumothorax. Musculoskeletal: No chest wall mass or suspicious bone lesions identified. CT ABDOMEN PELVIS FINDINGS Hepatobiliary: No solid liver abnormality is seen. There are numerous lobulated, fluid attenuating cysts throughout the hepatic parenchyma, in numerous subcentimeter lesions are too small to characterize although demonstrate no evidence of contrast enhancement and are likely additional subcentimeter cysts. Thickening and fat stranding about gallbladder (series 7, image 93). Status post common bile duct stent and post stenting pneumobilia. Pancreas: Heterogeneous mass of the central pancreatic head measuring 4.5 x 3.3 cm (series 7, image 108). There is atrophy of the distal pancreatic parenchyma with diffuse pancreatic ductal dilatation up to 0.8 cm. Mass appears to closely abut the lateral surface of the portal vein near the confluence (series 7, image 100). The superior mesenteric artery is well separated by a fat plane (series 2, image 52), as is the hepatic artery (series 2, image 42). Spleen: Normal in size without significant abnormality. Adrenals/Urinary Tract: Adrenal glands are unremarkable.  Kidneys are normal, without renal calculi, solid lesion, or hydronephrosis. Bladder is unremarkable. Stomach/Bowel: Stomach is within normal limits. Fat stranding about the duodenal bulb and descending duodenum. Appendix appears normal. No evidence of bowel wall thickening, distention, or inflammatory changes. Descending and sigmoid diverticulosis. Vascular/Lymphatic: Aortic atherosclerosis. No enlarged abdominal or pelvic lymph nodes. Reproductive: No mass or other abnormality. Other: No abdominal wall hernia or abnormality. No abdominopelvic ascites. Musculoskeletal: No acute or significant osseous findings. IMPRESSION: 1. Heterogeneous mass of the central pancreatic head measuring 4.5 x 3.3 cm. There is atrophy of the distal pancreatic parenchyma with diffuse pancreatic ductal dilatation up to 0.8 cm. Mass appears to closely abut the lateral surface of the portal vein near the confluence. The superior mesenteric artery is well separated by a fat plane, as is the hepatic artery. 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 The exact borders and size of the mass are better delineated by prior MR. 2. No evidence of metastatic disease in the chest, abdomen, or pelvis. 3. There are numerous lobulated, fluid attenuating cysts throughout the hepatic parenchyma, numerous additional subcentimeter lesions are too small to characterize although demonstrate no evidence of contrast enhancement and are likely additional subcentimeter cysts. Attention on follow-up. 4. Status post common bile duct stent with post stenting pneumobilia. 5. Thickening and fat stranding about the gallbladder as well as about the adjacent duodenal bulb and descending duodenum, of uncertain etiology, concerning for cholecystitis, enteritis, and/or pancreatitis, possibly related to recent endoscopic procedure. Aortic Atherosclerosis (ICD10-I70.0). Electronically Signed   By: Eddie Candle M.D.   On: 09/21/2020 18:08   MR 3D Recon At  Scanner  Result Date: 09/21/2020 CLINICAL DATA:  Jaundice. Common bile duct obstruction seen on RIGHT upper quadrant ultrasound EXAM: MRI ABDOMEN WITHOUT AND WITH CONTRAST (INCLUDING MRCP) TECHNIQUE: Multiplanar multisequence MR imaging of the abdomen was performed both before and after the administration of intravenous contrast. Heavily T2-weighted images of the biliary and pancreatic ducts were obtained, and three-dimensional MRCP images were rendered by post processing. CONTRAST:  62mL GADAVIST GADOBUTROL 1 MMOL/ML IV SOLN COMPARISON:  Ultrasound 09/18/2020 FINDINGS: Exam is degraded respiratory motion. Best images possible. The MRCP images are significantly degraded. Additionally is IV infiltration therefore no true T1 noncontrast imaging was available Lower chest:  Lung bases are clear. Hepatobiliary: The common hepatic duct and common bile duct are prominent. The common bile duct is upper limits of normal at 6 mm. There is a mass in  the pancreatic head which may partially obstructs the common bile duct. There is mild biliary duct dilatation in the LEFT hepatic lobe best seen on delayed contrast imaging (image 32/53). There multiple round cystic lesions in LEFT and RIGHT hepatic lobe which are hyperintense on T2 weighted imaging (series 4). The cysts have no appreciable post-contrast enhancement. Some limitation in contrast enhanced assessment due to patient motion as well as IV infiltration which resulted in no true noncontrast pre contrast imaging. Pancreas: In the head of the pancreas there is a hypoenhancing mass measuring 3.2 by 2.9 cm on image 61/series 18. Lesion is well seen as mixed hyperintensity on T2 weighted imaging measuring 2.7 by 2.5 cm on image 26/4. There is significant upstream pancreatic duct dilatation to 8 mm (21/4). There is atrophy of the pancreatic body and tail related to the duct dilatation. Spleen: Normal spleen. Adrenals/urinary tract: Adrenal glands and kidneys are normal.  Stomach/Bowel: Stomach and limited of the small bowel is unremarkable Vascular/Lymphatic: Abdominal aortic normal caliber. No retroperitoneal periportal lymphadenopathy. Musculoskeletal: No aggressive osseous lesion IMPRESSION: 1. 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 Recommend ERCP/EUS or tissue sampling. 2. There is mild biliary ductal dilatation of the common hepatic bile duct and the intrahepatic bile ducts in the LEFT hepatic lobe. Sequences are severely degraded by patient motion. Concern for obstruction of the common bile duct by the pancreatic head mass. 3. Multiple cystic lesions throughout the liver parenchyma. Assessment of enhancement is difficult due to patient motion as well as no true noncontrast T1 weighted imaging as described above. Favor complex benign cysts but consider liver protocol contrast CT for further evaluation of the cystic lesions to exclude metastasis. Electronically Signed   By: Suzy Bouchard M.D.   On: 09/21/2020 07:28   DG Chest Portable 1 View  Result Date: 09/18/2020 CLINICAL DATA:  Chest pain EXAM: PORTABLE CHEST 1 VIEW COMPARISON:  None. FINDINGS: The heart size and mediastinal contours are within normal limits. Aortic atherosclerosis. Both lungs are clear. The visualized skeletal structures are unremarkable. IMPRESSION: No active disease. Electronically Signed   By: Donavan Foil M.D.   On: 09/18/2020 19:52   DG ERCP BILIARY & PANCREATIC DUCTS  Result Date: 09/19/2020 CLINICAL DATA:  80 year old female with biliary obstruction EXAM: ERCP TECHNIQUE: Multiple spot images obtained with the fluoroscopic device and submitted for interpretation post-procedure. FLUOROSCOPY TIME:  Fluoroscopy Time:  3 minutes 10 seconds Radiation Exposure Index (if provided by the fluoroscopic device): 39.4 mGy Number of Acquired Spot Images: 0 COMPARISON:  None. FINDINGS: Five intraoperative  spot images are submitted for review. The images demonstrate a flexible duodenal scope in the descending duodenum followed by wire cannulation of the left intrahepatic ducts. Cholangiogram demonstrates marked dilation of the intra and extrahepatic ducts with significant narrowing of the mid and distal common duct. Ultimately, a self expanding metallic stent was placed. There appears to be good drainage of the biliary system on the final image. IMPRESSION: 1. Mid and distal biliary duct obstruction. 2. Successful placement of self expanding metallic biliary stent. These images were submitted for radiologic interpretation only. Please see the procedural report for the amount of contrast and the fluoroscopy time utilized. Electronically Signed   By: Jacqulynn Cadet M.D.   On: 09/19/2020 15:05   MR ABDOMEN MRCP W WO CONTAST  Result Date: 09/21/2020 CLINICAL DATA:  Jaundice. Common bile duct obstruction seen on RIGHT upper quadrant ultrasound EXAM: MRI  ABDOMEN WITHOUT AND WITH CONTRAST (INCLUDING MRCP) TECHNIQUE: Multiplanar multisequence MR imaging of the abdomen was performed both before and after the administration of intravenous contrast. Heavily T2-weighted images of the biliary and pancreatic ducts were obtained, and three-dimensional MRCP images were rendered by post processing. CONTRAST:  104m GADAVIST GADOBUTROL 1 MMOL/ML IV SOLN COMPARISON:  Ultrasound 09/18/2020 FINDINGS: Exam is degraded respiratory motion. Best images possible. The MRCP images are significantly degraded. Additionally is IV infiltration therefore no true T1 noncontrast imaging was available Lower chest:  Lung bases are clear. Hepatobiliary: The common hepatic duct and common bile duct are prominent. The common bile duct is upper limits of normal at 6 mm. There is a mass in the pancreatic head which may partially obstructs the common bile duct. There is mild biliary duct dilatation in the LEFT hepatic lobe best seen on delayed contrast  imaging (image 32/53). There multiple round cystic lesions in LEFT and RIGHT hepatic lobe which are hyperintense on T2 weighted imaging (series 4). The cysts have no appreciable post-contrast enhancement. Some limitation in contrast enhanced assessment due to patient motion as well as IV infiltration which resulted in no true noncontrast pre contrast imaging. Pancreas: In the head of the pancreas there is a hypoenhancing mass measuring 3.2 by 2.9 cm on image 61/series 18. Lesion is well seen as mixed hyperintensity on T2 weighted imaging measuring 2.7 by 2.5 cm on image 26/4. There is significant upstream pancreatic duct dilatation to 8 mm (21/4). There is atrophy of the pancreatic body and tail related to the duct dilatation. Spleen: Normal spleen. Adrenals/urinary tract: Adrenal glands and kidneys are normal. Stomach/Bowel: Stomach and limited of the small bowel is unremarkable Vascular/Lymphatic: Abdominal aortic normal caliber. No retroperitoneal periportal lymphadenopathy. Musculoskeletal: No aggressive osseous lesion IMPRESSION: 1. 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 Recommend ERCP/EUS or tissue sampling. 2. There is mild biliary ductal dilatation of the common hepatic bile duct and the intrahepatic bile ducts in the LEFT hepatic lobe. Sequences are severely degraded by patient motion. Concern for obstruction of the common bile duct by the pancreatic head mass. 3. Multiple cystic lesions throughout the liver parenchyma. Assessment of enhancement is difficult due to patient motion as well as no true noncontrast T1 weighted imaging as described above. Favor complex benign cysts but consider liver protocol contrast CT for further evaluation of the cystic lesions to exclude metastasis. Electronically Signed   By: SSuzy BouchardM.D.   On: 09/21/2020 07:28   UKoreaAbdomen Limited RUQ (LIVER/GB)  Result Date:  09/18/2020 CLINICAL DATA:  Jaundice EXAM: ULTRASOUND ABDOMEN LIMITED RIGHT UPPER QUADRANT COMPARISON:  None. FINDINGS: Gallbladder: Sludge seen within the gallbladder. Gallbladder wall is thickened at 5 mm. Small amount of pericholecystic fluid. Common bile duct: Diameter: Dilated measuring up to 13 mm. Liver: Intrahepatic biliary ductal dilatation. Multiple cysts throughout the liver measuring up to 4.6 cm. Complex area posteriorly in the right hepatic lobe measures up to 3.6 cm which could reflect a complex septated cyst. Portal vein is patent on color Doppler imaging with normal direction of blood flow towards the liver. Other: None. IMPRESSION: Intrahepatic and extrahepatic biliary ductal dilatation. Common bile duct dilated up to 13 mm. This could be further evaluated with MRCP or ERCP. Sludge within the gallbladder with associated gallbladder wall thickening and pericholecystic fluid. Cannot exclude acute cholecystitis. Multiple hepatic cysts. Complex cystic area posteriorly in the right hepatic lobe. This could also  be further evaluated with MRI. Electronically Signed   By: Rolm Baptise M.D.   On: 09/18/2020 21:52     ASSESSMENT & PLAN:  Tina Patel is a 80 y.o. 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 Her initial EUS on 09/19/20 showed mass at head of pancreas, but biopsy was negative. She had ERCP stent placement same day. -Her repeat EUS biopsy of the mass on 09/26/20 showed adenocarcinoma from pancreatic primary. Her CT CAP showed mass to be 4.5cm. No evidence of metastatic disease in the chest, abdomen, or pelvis. She does have multiple benign cystic lesions in the liver. I reviewed with pt and her daughter in detail today  -I discussed the aggressive nature of pancreatic cancer,  her cancer staging and prognosis.  This is a potentially resectable and curable disease with Whipple surgery. I discussed this can lead to long recovery. I discussed her cancer is overall borderline resectable due to invasion of portal vein I will refer her to Surgeons Dr Zenia Resides and Dr Barry Dienes for consult of surgery with her. I will also get their opinion on next Tumour GI Board this week -I discussed very high risk of recurrence after surgical resection alone, and the role of neoadjuvant or adjuvant chemotherapy to reduce recurrence, and increase complete surgery resection rate after neoadjuvant chemo.  -I discussed the option of adjuvant chemotherapy.  Due to her advanced age, medical comorbidities, I do not think she is a candidate for FOLFIRINOX, she may be able to tolerate gmcitabine and Abraxane 2 weeks on/1 week off.  I reviewed the benefit of chemotherapy, and potential side effect with patient and her daughter in detail. -I discussed if surgery is not an option, goal of care is no longer curative but palliative to control her disease and prolong her life. I would then recommend systemic chemotherapy for 3-6 months before consolidation radiation and followed by observation.  -I also discussed the option of SBRT target radiation alone, which is a local therapy, and she will likely develop metastatic disease after radiation. -I also discussed the option of palliative care alone if she decides not to proceed with any cancer treatment. -She is interested in treatment, but will continue to think and discuss about her options with her family, before making a decision.  -Phone call in 1-2 weeks to finalize her treatment plan.   2. Obstructive Jaundice, Hyperbilirubinemia, Secondary to #1 -She was seen jaundice and elevated Tbili in early June 2022 hospitalization.  -She also had RUQ pain recently. This is controlled on Tramadol.  -Symptoms much improved after ERCP Stent placement on 09/19/20 with Dr Benson Norway.  Jaundice is minimal.   3. Weight loss, Fatigue, Secondary to #1 -For the past 3-6 months she has had 25 pounds weight loss and more fatigued. She is fine with her weight loss, but willing to gain back to 140 pounds.  -She has low appetite, nausea and mild early satiety. She forces herself to remain active at home.  -I recommend Glucerna for nutritional supplement and appetite stimulant Mirtazapine. She is agreeable. I recommend smaller 3-5 meals a day. I will refer her to dietician for   4. 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 Genetic Testing  -Given her pancreatic cancer she is eligible for genetic testing. She is interested. I will refer  her.   6. Social Support  -She has great social support from her husband [phone]) who she lives with and her 3 children who live in town. Her youngest, Tina Patel came with her in clinic today (09/29/20)    PLAN:  -I called in Mirtazapine today -Send Genetic and Dietician referral  -Phone call in 1-2 weeks to finalize her treatment plan    Orders Placed This Encounter  Procedures   Ambulatory Referral to [facility] Nutrition    Referral Priority:   Urgent    Referral Type:   Consultation    Referral Reason:   Specialty Services Required    Number of Visits Requested:   1   Ambulatory referral to Genetics    Referral Priority:   Urgent    Referral Type:   Consultation    Referral Reason:   Specialty Services Required    Number of Visits Requested:   1     All questions were answered. The patient knows to call the clinic with any problems, questions or concerns. The total time spent in the appointment was 60 minutes.     [name], MD 09/29/2020 9:46 PM  I, Joslyn Devon, am acting as scribe for [name], MD.   I have reviewed the above documentation for accuracy and completeness, and I agree with the above.   Addendum  I presented her case in GI conference today. Both Dr. Barry Dienes and Zenia Resides feel her pancreatic cancer is  borderline resectable but will require vascular reconstruction, ,and recommend referral her to a tertiary cancer center for surgery. We recommend neoadjuvant chemo first. I called her daughter after the conference. Pt has decided to take cancer treatment and agree with chemo first. I will set up her first dose gemcitabine in 1-2 weeks. I will set up chemo class for her.   [name]  10/01/2020

## 2020-09-25 DIAGNOSIS — M549 Dorsalgia, unspecified: Secondary | ICD-10-CM | POA: Diagnosis not present

## 2020-09-25 DIAGNOSIS — Z09 Encounter for follow-up examination after completed treatment for conditions other than malignant neoplasm: Secondary | ICD-10-CM | POA: Diagnosis not present

## 2020-09-25 DIAGNOSIS — E119 Type 2 diabetes mellitus without complications: Secondary | ICD-10-CM | POA: Diagnosis not present

## 2020-09-25 DIAGNOSIS — K8689 Other specified diseases of pancreas: Secondary | ICD-10-CM | POA: Diagnosis not present

## 2020-09-26 ENCOUNTER — Encounter (HOSPITAL_COMMUNITY)
Admission: RE | Disposition: A | Discharge status: Home / Self Care | Payer: Self-pay | Source: Home / Self Care | Attending: Gastroenterology

## 2020-09-26 ENCOUNTER — Encounter (HOSPITAL_COMMUNITY): Payer: Self-pay | Admitting: Gastroenterology

## 2020-09-26 ENCOUNTER — Ambulatory Visit (HOSPITAL_COMMUNITY): Payer: Medicare Other | Admitting: Anesthesiology

## 2020-09-26 ENCOUNTER — Other Ambulatory Visit: Payer: Self-pay

## 2020-09-26 ENCOUNTER — Ambulatory Visit (HOSPITAL_COMMUNITY)
Admission: RE | Admit: 2020-09-26 | Discharge: 2020-09-26 | Disposition: A | Discharge status: Home / Self Care | Payer: Medicare Other | Attending: Gastroenterology | Admitting: Gastroenterology

## 2020-09-26 DIAGNOSIS — K8689 Other specified diseases of pancreas: Secondary | ICD-10-CM | POA: Diagnosis not present

## 2020-09-26 DIAGNOSIS — E119 Type 2 diabetes mellitus without complications: Secondary | ICD-10-CM | POA: Insufficient documentation

## 2020-09-26 DIAGNOSIS — K831 Obstruction of bile duct: Secondary | ICD-10-CM | POA: Diagnosis not present

## 2020-09-26 DIAGNOSIS — Z79890 Hormone replacement therapy (postmenopausal): Secondary | ICD-10-CM | POA: Insufficient documentation

## 2020-09-26 DIAGNOSIS — C259 Malignant neoplasm of pancreas, unspecified: Secondary | ICD-10-CM | POA: Diagnosis not present

## 2020-09-26 DIAGNOSIS — K7689 Other specified diseases of liver: Secondary | ICD-10-CM | POA: Insufficient documentation

## 2020-09-26 DIAGNOSIS — C250 Malignant neoplasm of head of pancreas: Secondary | ICD-10-CM | POA: Diagnosis not present

## 2020-09-26 DIAGNOSIS — Z794 Long term (current) use of insulin: Secondary | ICD-10-CM | POA: Insufficient documentation

## 2020-09-26 DIAGNOSIS — Z7984 Long term (current) use of oral hypoglycemic drugs: Secondary | ICD-10-CM | POA: Diagnosis not present

## 2020-09-26 HISTORY — PX: UPPER ESOPHAGEAL ENDOSCOPIC ULTRASOUND (EUS): SHX6562

## 2020-09-26 HISTORY — DX: Essential (primary) hypertension: I10

## 2020-09-26 HISTORY — PX: ESOPHAGOGASTRODUODENOSCOPY (EGD) WITH PROPOFOL: SHX5813

## 2020-09-26 HISTORY — PX: FINE NEEDLE ASPIRATION: SHX5430

## 2020-09-26 LAB — GLUCOSE, CAPILLARY: Glucose-Capillary: 220 mg/dL — ABNORMAL HIGH (ref 70–99)

## 2020-09-26 SURGERY — UPPER ESOPHAGEAL ENDOSCOPIC ULTRASOUND (EUS)
Anesthesia: Monitor Anesthesia Care

## 2020-09-26 MED ORDER — LACTATED RINGERS IV SOLN: INTRAVENOUS | Status: DC

## 2020-09-26 MED ORDER — PROPOFOL 500 MG/50ML IV EMUL
INTRAVENOUS | Status: DC | PRN
  Administered 2020-09-26: 150 ug/kg/min via INTRAVENOUS

## 2020-09-26 MED ORDER — SODIUM CHLORIDE 0.9 % IV SOLN: INTRAVENOUS | Status: DC

## 2020-09-26 MED ORDER — LACTATED RINGERS IV SOLN
INTRAVENOUS | Status: AC | PRN
  Administered 2020-09-26: 1000 mL via INTRAVENOUS

## 2020-09-26 NOTE — Interval H&P Note (Signed)
History and Physical Interval Note:  09/26/2020 12:41 PM  Tina Patel  has presented today for surgery, with the diagnosis of pancreatic head mass.  The various methods of treatment have been discussed with the patient and family. After consideration of risks, benefits and other options for treatment, the patient has consented to  Procedure(s): UPPER ESOPHAGEAL ENDOSCOPIC ULTRASOUND (EUS) (N/A) FINE NEEDLE ASPIRATION (FNA) LINEAR (N/A) as a surgical intervention.  The patient's history has been reviewed, patient examined, no change in status, stable for surgery.  I have reviewed the patient's chart and labs.  Questions were answered to the patient's satisfaction.     Pheonix Clinkscale D

## 2020-09-26 NOTE — Anesthesia Postprocedure Evaluation (Signed)
Anesthesia Post Note  Patient: Tina Patel  Procedure(s) Performed: UPPER ESOPHAGEAL ENDOSCOPIC ULTRASOUND (EUS) FINE NEEDLE ASPIRATION (FNA) LINEAR ESOPHAGOGASTRODUODENOSCOPY (EGD) WITH PROPOFOL     Patient location during evaluation: PACU Anesthesia Type: MAC Level of consciousness: awake and alert Pain management: pain level controlled Vital Signs Assessment: post-procedure vital signs reviewed and stable Respiratory status: spontaneous breathing and respiratory function stable Cardiovascular status: stable Postop Assessment: no apparent nausea or vomiting Anesthetic complications: no   No notable events documented.  Last Vitals:  Vitals:   09/26/20 1610 09/26/20 1621  BP: (!) 111/59 (!) 127/95  Pulse: 66 64  Resp: 18 14  Temp:  36.7 C  SpO2: 100% 100%    Last Pain:  Vitals:   09/26/20 1621  TempSrc: Oral  PainSc: 0-No pain                 Levoy Geisen DANIEL

## 2020-09-26 NOTE — Anesthesia Preprocedure Evaluation (Addendum)
Anesthesia Evaluation  Patient identified by MRN, date of birth, ID band Patient awake    Reviewed: Allergy & Precautions, NPO status , Patient's Chart, lab work & pertinent test results  History of Anesthesia Complications Negative for: history of anesthetic complications  Airway Mallampati: II  TM Distance: >3 FB     Dental  (+) Partial Upper, Dental Advisory Given   Pulmonary neg pulmonary ROS,    Pulmonary exam normal        Cardiovascular hypertension, Pt. on medications Normal cardiovascular exam Rhythm:Regular Rate:Normal     Neuro/Psych negative neurological ROS     GI/Hepatic negative GI ROS, Neg liver ROS,   Endo/Other  diabetes, Type 2, Oral Hypoglycemic AgentsHypothyroidism Pancreatic mass  Renal/GU negative Renal ROS     Musculoskeletal   Abdominal   Peds  Hematology negative hematology ROS (+)   Anesthesia Other Findings   Reproductive/Obstetrics                            Anesthesia Physical  Anesthesia Plan  ASA: 3  Anesthesia Plan: MAC   Post-op Pain Management:    Induction: Intravenous  PONV Risk Score and Plan: 2 and Ondansetron and Propofol infusion  Airway Management Planned: Natural Airway, Nasal Cannula and Simple Face Mask  Additional Equipment:   Intra-op Plan:   Post-operative Plan:   Informed Consent: I have reviewed the patients History and Physical, chart, labs and discussed the procedure including the risks, benefits and alternatives for the proposed anesthesia with the patient or authorized representative who has indicated his/her understanding and acceptance.     Dental advisory given  Plan Discussed with: CRNA and Anesthesiologist  Anesthesia Plan Comments:         Anesthesia Quick Evaluation

## 2020-09-26 NOTE — Transfer of Care (Signed)
Immediate Anesthesia Transfer of Care Note  Patient: Tina Patel  Procedure(s) Performed: Procedure(s): UPPER ESOPHAGEAL ENDOSCOPIC ULTRASOUND (EUS) (N/A) FINE NEEDLE ASPIRATION (FNA) LINEAR (N/A) ESOPHAGOGASTRODUODENOSCOPY (EGD) WITH PROPOFOL (N/A)  Patient Location: PACU and Endoscopy Unit  Anesthesia Type:MAC  Level of Consciousness: awake, alert  and oriented  Airway & Oxygen Therapy: Patient Spontanous Breathing and Patient connected to nasal cannula oxygen  Post-op Assessment: Report given to RN and Post -op Vital signs reviewed and stable  Post vital signs: Reviewed and stable  Last Vitals:  Vitals:   09/26/20 1305  BP: (!) 128/56  Resp: 11  Temp: 36.8 C  SpO2: 950%    Complications: No apparent anesthesia complications

## 2020-09-26 NOTE — Discharge Instructions (Signed)

## 2020-09-26 NOTE — Op Note (Signed)
[facility] Patient Name: Tina Patel Procedure Date: 09/26/2020 MRN: [id number] Attending MD: [name] , MD Date of Birth: 1940-06-29 CSN: [id number] Age: 80 Admit Type: Outpatient Procedure:                Upper EUS Indications:              Pancreatic adenocarcinoma Providers:                [name], MD, Carlyn Reichert, RN, Fransico Setters                            Mbumina, Technician Referring MD:              Medicines:                Propofol per Anesthesia Complications:            No immediate complications. Estimated Blood Loss:     Estimated blood loss: none. Procedure:                Pre-Anesthesia Assessment:                           - Prior to the procedure, a History and Physical                            was performed, and patient medications and                            allergies were reviewed. The patient's tolerance of                            previous anesthesia was also reviewed. The risks                            and benefits of the procedure and the sedation                            options and risks were discussed with the patient.                            All questions were answered, and informed consent                            was obtained. Prior Anticoagulants: The patient has                            taken no previous anticoagulant or antiplatelet                            agents. ASA Grade Assessment: III - A patient with                            severe systemic disease. After reviewing the risks                            and benefits,  the patient was deemed in                            satisfactory condition to undergo the procedure.                           - Sedation was administered by an anesthesia                            professional. Deep sedation was attained.                           After obtaining informed consent, the endoscope was                            passed under direct vision. Throughout the                             procedure, the patient's blood pressure, pulse, and                            oxygen saturations were monitored continuously. The                            [id number] ([id number]) Olympus Linear EUS was                            introduced through the mouth, and advanced to the                            second part of duodenum. The upper EUS was                            accomplished without difficulty. The patient                            tolerated the procedure well. Scope In: Scope Out: Findings:      ENDOSONOGRAPHIC FINDING: :      An irregular mass was identified in the pancreatic head. The mass was       hypoechoic. The mass measured 35 mm by 40 mm in maximal cross-sectional       diameter. The outer margins were irregular. Fine needle aspiration for       cytology was performed. Color Doppler imaging was utilized prior to       needle puncture to confirm a lack of significant vascular structures       within the needle path. Five passes were made with the 25 gauge needle       using a transduodenal approach. A stylet was used. A cytotechnologist       was present to evaluate the adequacy of the specimen. The cellularity of       the specimen was adequate. Final cytology results are pending.      The objective of today's procedure was for tissue diagnosis. Excellent       samples were obtained. See the prior EUS examination for details about  the anatomy and mass. Impression:               - A mass was identified in the pancreatic head.                            Cytology results are pending. However, the                            endosonographic appearance is consistent with                            adenocarcinoma. Fine needle aspiration performed. Moderate Sedation:      Not Applicable - Patient had care per Anesthesia. Recommendation:           - Resume regular diet.                           - Await cytology results. Procedure Code(s):         --- Professional ---                           [phone], Esophagogastroduodenoscopy, flexible,                            transoral; with transendoscopic ultrasound-guided                            intramural or transmural fine needle                            aspiration/biopsy(s), (includes endoscopic                            ultrasound examination limited to the esophagus,                            stomach or duodenum, and adjacent structures) Diagnosis Code(s):        --- Professional ---                           K86.89, Other specified diseases of pancreas                           C25.9, Malignant neoplasm of pancreas, unspecified CPT copyright 2019 American Medical Association. All rights reserved. The codes documented in this report are preliminary and upon coder review may  be revised to meet current compliance requirements. [name], MD [name], MD 09/26/2020 4:02:43 PM This report has been signed electronically. Number of Addenda: 0

## 2020-09-29 ENCOUNTER — Inpatient Hospital Stay: Payer: Medicare Other | Attending: Hematology | Admitting: Hematology

## 2020-09-29 ENCOUNTER — Encounter: Payer: Self-pay | Admitting: Hematology

## 2020-09-29 ENCOUNTER — Other Ambulatory Visit: Payer: Self-pay

## 2020-09-29 ENCOUNTER — Telehealth: Payer: Self-pay | Admitting: Hematology

## 2020-09-29 DIAGNOSIS — E119 Type 2 diabetes mellitus without complications: Secondary | ICD-10-CM | POA: Insufficient documentation

## 2020-09-29 DIAGNOSIS — K7689 Other specified diseases of liver: Secondary | ICD-10-CM | POA: Diagnosis not present

## 2020-09-29 DIAGNOSIS — C259 Malignant neoplasm of pancreas, unspecified: Secondary | ICD-10-CM | POA: Insufficient documentation

## 2020-09-29 DIAGNOSIS — Z79899 Other long term (current) drug therapy: Secondary | ICD-10-CM | POA: Diagnosis not present

## 2020-09-29 DIAGNOSIS — H9190 Unspecified hearing loss, unspecified ear: Secondary | ICD-10-CM | POA: Insufficient documentation

## 2020-09-29 DIAGNOSIS — C250 Malignant neoplasm of head of pancreas: Secondary | ICD-10-CM | POA: Insufficient documentation

## 2020-09-29 DIAGNOSIS — Z801 Family history of malignant neoplasm of trachea, bronchus and lung: Secondary | ICD-10-CM | POA: Diagnosis not present

## 2020-09-29 DIAGNOSIS — Z808 Family history of malignant neoplasm of other organs or systems: Secondary | ICD-10-CM | POA: Diagnosis not present

## 2020-09-29 DIAGNOSIS — E039 Hypothyroidism, unspecified: Secondary | ICD-10-CM | POA: Insufficient documentation

## 2020-09-29 DIAGNOSIS — K831 Obstruction of bile duct: Secondary | ICD-10-CM | POA: Insufficient documentation

## 2020-09-29 DIAGNOSIS — I10 Essential (primary) hypertension: Secondary | ICD-10-CM | POA: Insufficient documentation

## 2020-09-29 DIAGNOSIS — E7800 Pure hypercholesterolemia, unspecified: Secondary | ICD-10-CM | POA: Insufficient documentation

## 2020-09-29 DIAGNOSIS — Z5111 Encounter for antineoplastic chemotherapy: Secondary | ICD-10-CM | POA: Insufficient documentation

## 2020-09-29 DIAGNOSIS — R5383 Other fatigue: Secondary | ICD-10-CM | POA: Insufficient documentation

## 2020-09-29 DIAGNOSIS — R634 Abnormal weight loss: Secondary | ICD-10-CM | POA: Insufficient documentation

## 2020-09-29 DIAGNOSIS — Z794 Long term (current) use of insulin: Secondary | ICD-10-CM | POA: Insufficient documentation

## 2020-09-29 DIAGNOSIS — Z8042 Family history of malignant neoplasm of prostate: Secondary | ICD-10-CM | POA: Insufficient documentation

## 2020-09-29 LAB — CYTOLOGY - NON PAP

## 2020-09-29 MED ORDER — MIRTAZAPINE 7.5 MG PO TABS: 7.5000 mg | ORAL_TABLET | Freq: Every day | ORAL | 0 refills | Status: DC

## 2020-09-29 NOTE — Progress Notes (Signed)
Attempted, unable to leave message/bt 

## 2020-09-29 NOTE — Telephone Encounter (Signed)
Scheduled appointment per 06/13 sch msg. Patient is aware.

## 2020-09-30 ENCOUNTER — Other Ambulatory Visit: Payer: Self-pay | Admitting: Licensed Clinical Social Worker

## 2020-09-30 DIAGNOSIS — C250 Malignant neoplasm of head of pancreas: Secondary | ICD-10-CM

## 2020-09-30 NOTE — Progress Notes (Signed)
Genic s

## 2020-09-30 NOTE — Progress Notes (Signed)
Met with patient and her daughter Tina Patel at her initial medical oncology consult with Dr. [name].   I introduced myself and explained my role as nurse navigator.  They both were given my direct contact information and encouraged to call with any questions or concerns.  The patient is hearing impaired and is strongly affected by the wearing of masks during Covid as previously she read lips.  She was however able to understand what I said and able to give appropriate feedback.  The patient has good family support from her husband Tina Patel and two daughters.  Her daughter Tina Patel asked that she be called with her appointments so I have noted that in her chart.  They both verbalized an understanding that we plan to discuss her at our Tumor Board this week to determine if they feel she is a surgical candidate.  She has been referred by Dr. Burr Medico to Genetics and the nutritionist.  I have reviewed with the patient and her daughter the importance of using Ensure two to three times daily to attempt to make up for her caloric deficient and get adequate protein and nutrients.  She currently is eating only small amounts and primarily vegetables and fruits. She verbalized an understanding.

## 2020-10-01 ENCOUNTER — Other Ambulatory Visit: Payer: Self-pay

## 2020-10-01 ENCOUNTER — Encounter: Payer: Self-pay | Admitting: Hematology

## 2020-10-01 NOTE — Progress Notes (Signed)
The proposed treatment discussed in conference is for discussion purposes only and is not a binding recommendation.  The patients have not been physically examined, or presented with their treatment options.  Therefore, final treatment plans cannot be decided.   

## 2020-10-02 ENCOUNTER — Other Ambulatory Visit: Payer: Self-pay

## 2020-10-02 ENCOUNTER — Inpatient Hospital Stay: Payer: Medicare Other | Admitting: Nutrition

## 2020-10-02 NOTE — Progress Notes (Signed)
80 year old female diagnosed with pancreas cancer.  She is followed by Dr. Burr Medico. She is considering treatment plan.  Past medical history includes 90% deaf, diabetes, hypertension, hypercholesterolemia, and hypothyroidism.  Medications include Remeron, Pepcid, Lasix, Lantus, Synthroid, Glucophage XR, and vitamin D.  Labs include glucose between 160-283.  Height: 5 feet 7 inches. Weight: 137.9 pounds June 13. BMI: 21.6.  Patient has a reported 25 pound weight loss.  She reports early satiety, decreased appetite, and occasional nausea.  Reports she has been drinking Ensure complete and prefers strawberry.  Husband reports improved blood sugars as he has titrated insulin per MD.  Patient provided a list of all the foods that she enjoys.  It is a good comprehensive list of a variety of protein foods as well as fruits vegetables and carbohydrates.  Nutrition diagnosis: Food and nutrition related knowledge deficit related to pancreas cancer and associated treatments as evidenced by no prior need for nutrition related information.  Intervention: Educated to consume small frequent meals and snacks with adequate calories and protein for weight maintenance. Reviewed high-calorie, high-protein foods. Continue Ensure complete or Glucerna per her preference.  Provided samples of Ensure complete and Glucerna 1.5.  Encourage patient to drink these slowly. Provided nutrition fact sheets.  Questions were answered.  Teach back method used.  Monitoring, evaluation, goals: Patient will tolerate adequate calories and protein to minimize weight loss.  Next visit: To be scheduled with upcoming treatment.  Patient has my contact information.  **Disclaimer: This note was dictated with voice recognition software. Similar sounding words can inadvertently be transcribed and this note may contain transcription errors which may not have been corrected upon publication of note.**

## 2020-10-03 ENCOUNTER — Other Ambulatory Visit: Payer: Self-pay | Admitting: Hematology

## 2020-10-03 ENCOUNTER — Other Ambulatory Visit: Payer: Self-pay

## 2020-10-03 DIAGNOSIS — C250 Malignant neoplasm of head of pancreas: Secondary | ICD-10-CM

## 2020-10-03 MED ORDER — PROCHLORPERAZINE MALEATE 10 MG PO TABS: 10.0000 mg | ORAL_TABLET | Freq: Four times a day (QID) | ORAL | 1 refills | Status: DC | PRN

## 2020-10-03 MED ORDER — ONDANSETRON HCL 8 MG PO TABS: 8.0000 mg | ORAL_TABLET | Freq: Two times a day (BID) | ORAL | 1 refills | Status: DC | PRN

## 2020-10-03 NOTE — Progress Notes (Signed)
Patient's daughter Levada Dy calls wanting to make sure Dr. Burr Medico got the My Chart message that they want to proceed with chemotherapy.  Also they are requesting treatment be on Monday, Tuesday or Fridays due to their own work schedule.  I have sent Allene Pyo schedule this information and we are working to coordinate this.  I scheduled her for chemo education class Monday 6/20 at 8:00 am.  She also states they would like a second surgical opinion with [facility].  Per Dr. Burr Medico she has spoken with Dr. Mariah Milling and has agreed to a virtual visit and the daughter has agreed.  She requests they call her number to arrange.  I have made Dr. Burr Medico aware.

## 2020-10-03 NOTE — Progress Notes (Signed)
START ON PATHWAY REGIMEN - Pancreatic Adenocarcinoma     A cycle is every 28 days:     Nab-paclitaxel (protein bound)      Gemcitabine   **Always confirm dose/schedule in your pharmacy ordering system**  Patient Characteristics: Preoperative (Clinical Staging), Borderline Resectable, PS ? 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

## 2020-10-03 NOTE — Progress Notes (Signed)
Pharmacist Chemotherapy Monitoring - Initial Assessment    Anticipated start date: 10/08/20  The following has been reviewed per standard work regarding the patient's treatment regimen: The patient's diagnosis, treatment plan and drug doses, and organ/hematologic function Lab orders and baseline tests specific to treatment regimen  The treatment plan start date, drug sequencing, and pre-medications Prior authorization status  Patient's documented medication list, including drug-drug interaction screen and prescriptions for anti-emetics and supportive care specific to the treatment regimen The drug concentrations, fluid compatibility, administration routes, and timing of the medications to be used The patient's access for treatment and lifetime cumulative dose history, if applicable  The patient's medication allergies and previous infusion related reactions, if applicable   Changes made to treatment plan:  drug offset times  Follow up needed:  Pending authorization for treatment     Kennith Center, Pharm.D., CPP 10/03/2020@4 :00 PM

## 2020-10-05 NOTE — Progress Notes (Signed)
[facility]   [phone] [phone]   Clinic Follow up Note   Patient Care Team: [name], DO as PCP - General (Family Medicine) [name], RN as Oncology Nurse Navigator [name], MD as Consulting Physician (Oncology) [name], MD as Consulting Physician (Gastroenterology) 10/06/2020  CHIEF COMPLAINT: Follow up pancreas cancer   SUMMARY OF ONCOLOGIC HISTORY: Oncology History Overview Note  Cancer Staging Pancreatic cancer [facility]) Staging form: Exocrine Pancreas, AJCC 8th Edition - Clinical stage from 09/26/2020: Stage IB (cT2, cN0, cM0) - Signed by [name], MD on 09/29/2020 Stage prefix: Initial diagnosis Total positive nodes: 0    Pancreatic cancer ([place])  09/18/2020 Imaging   US Abdomen  IMPRESSION: Intrahepatic and extrahepatic biliary ductal dilatation. Common bile duct dilated up to 13 mm. This could be further evaluated with MRCP or ERCP.   Sludge within the gallbladder with associated gallbladder wall thickening and pericholecystic fluid. Cannot exclude acute cholecystitis.   Multiple hepatic cysts. Complex cystic area posteriorly in the right hepatic lobe. This could also be further evaluated with MRI.   09/19/2020 Procedure   EUS by Dr hung  IMPRESSION - A mass was identified in the pancreatic head. The staging applies if malignancy is confirmed. Fine needle aspiration performed. - There was dilation in the common hepatic duct which measured up to 13 mm. - There was dilation in the intrahepatic bile ducts, diffusely. - A cyst was found in the left lobe of the liver and measured 24 mm by 15 mm. - Endosonographic images of the left adrenal gland were unremarkable.    A. PANCREAS, HEAD, FINE NEEDLE ASPIRATION:    FINAL MICROSCOPIC DIAGNOSIS:  - Benign reactive/reparative changes  - Bland spindle cell fragments   SPECIMEN ADEQUACY:  Satisfactory for evaluation   IMMEDIATE EVALUATION:  SPINDLE  FRAGMENTS AND BENIGN GLANDULAR FRAGMENTS. NO DEFINITIVE  MALIGNANCY. ([facility])   DIAGNOSTIC COMMENTS:  There are fragments of bland spindle cells, favored to represent smooth  muscle tissue. The glandular fragments are also bland and thus, there is  no definitive malignancy identified   B. BILIARY, STRICTURE, BRUSHING:    FINAL MICROSCOPIC DIAGNOSIS:  - Benign reactive/reparative changes   09/19/2020 Procedure   ERCP by Dr Benson Norway  IMPRESSION - The major papilla appeared normal. - A biliary sphincterotomy was performed. - Cells for cytology obtained distal CBD. - One covered metal stent was placed into the common bile duct.   09/20/2020 Imaging   MRI Abdomen  IMPRESSION: 1. 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 Recommend ERCP/EUS or tissue sampling. 2. There is mild biliary ductal dilatation of the common hepatic bile duct and the intrahepatic bile ducts in the LEFT hepatic lobe. Sequences are severely degraded by patient motion. Concern for obstruction of the common bile duct by the pancreatic head mass. 3. Multiple cystic lesions throughout the liver parenchyma. Assessment of enhancement is difficult due to patient motion as well as no true noncontrast T1 weighted imaging as described above. Favor complex benign cysts but consider liver protocol contrast CT for further evaluation of the cystic lesions to exclude metastasis.   09/21/2020 Imaging   CT CAP  IMPRESSION: 1. Heterogeneous mass of the central pancreatic head measuring 4.5 x 3.3 cm. There is atrophy of the distal pancreatic parenchyma with diffuse pancreatic ductal dilatation up to 0.8 cm. Mass appears to closely abut the lateral surface of the portal vein near the  confluence. The superior mesenteric artery is well separated by a fat plane, as is the hepatic artery. 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  The exact borders and size of the mass are better delineated by prior MR. 2. No evidence of metastatic disease in the chest, abdomen, or pelvis. 3. There are numerous lobulated, fluid attenuating cysts throughout the hepatic parenchyma, numerous additional subcentimeter lesions are too small to characterize although demonstrate no evidence of contrast enhancement and are likely additional subcentimeter cysts. Attention on follow-up. 4. Status post common bile duct stent with post stenting pneumobilia. 5. Thickening and fat stranding about the gallbladder as well as about the adjacent duodenal bulb and descending duodenum, of uncertain etiology, concerning for cholecystitis, enteritis, and/or pancreatitis, possibly related to recent endoscopic procedure.   Aortic Atherosclerosis (ICD10-I70.0).   09/26/2020 Cancer Staging   Staging form: Exocrine Pancreas, AJCC 8th Edition - Clinical stage from 09/26/2020: Stage IIA (cT3, cN0, cM0) - Signed by [name], MD on 09/29/2020  Stage prefix: Initial diagnosis  Total positive nodes: 0    09/26/2020 Procedure   EUS by Dr Benson Norway  IMPRESSION - A mass was identified in the pancreatic head. Cytology results are pending. However, the endosonographic appearance is consistent with adenocarcinoma. Fine needle aspiration performed.   09/26/2020 Initial Biopsy   A. PANCREAS, HEAD, FINE NEEDLE ASPIRATION:    FINAL MICROSCOPIC DIAGNOSIS:  - Malignant cells consistent with adenocarcinoma    09/29/2020 Initial Diagnosis   Pancreatic cancer ([place])    10/08/2020 -  Chemotherapy    Patient is on Treatment Plan: PANCREATIC ABRAXANE / GEMCITABINE D1,8,15 Q28D         CURRENT THERAPY: PENDING neoadjuvant chemo gemcitabine and abraxane 2 weeks on/1 week off. First cycle with single agent gemcitabine. Starting 10/07/20  INTERVAL HISTORY: Tina Patel returns for follow up and treatment as scheduled, accompanied by her husband.  She is having a good day.   Abdominal pain has nearly resolved, only mild discomfort now.  No significant nausea or vomiting.  Stools are little hard but having a BM once daily.  Appetite remains low, using Glucerna and Ensure supplements.  She is tired but remains up all day and out of bed.  Denies fever, chills, cough, chest pain, dyspnea, baseline neuropathy, or new concerns.    MEDICAL HISTORY:  Past Medical History:  Diagnosis Date   Diabetes ([place])    Diabetes mellitus    High cholesterol    Hypertension    Hypothyroid     SURGICAL HISTORY: Past Surgical History:  Procedure Laterality Date   BILIARY BRUSHING  09/19/2020   Procedure: BILIARY BRUSHING;  Surgeon: [name], MD;  Location: [facility];  Service: Endoscopy;;   BILIARY STENT PLACEMENT N/A 09/19/2020   Procedure: BILIARY STENT PLACEMENT;  Surgeon: [name], MD;  Location: [facility];  Service: Endoscopy;  Laterality: N/A;   ERCP N/A 09/19/2020   Procedure: ENDOSCOPIC RETROGRADE CHOLANGIOPANCREATOGRAPHY (ERCP);  Surgeon: [name], MD;  Location: [facility];  Service: Endoscopy;  Laterality: N/A;   ESOPHAGOGASTRODUODENOSCOPY (EGD) WITH PROPOFOL N/A 09/19/2020   Procedure: ESOPHAGOGASTRODUODENOSCOPY (EGD) WITH PROPOFOL;  Surgeon: [name], MD;  Location: [facility];  Service: Endoscopy;  Laterality: N/A;   ESOPHAGOGASTRODUODENOSCOPY (EGD) WITH PROPOFOL N/A 09/26/2020   Procedure: ESOPHAGOGASTRODUODENOSCOPY (EGD) WITH PROPOFOL;  Surgeon: [name], MD;  Location: [facility];  Service: Endoscopy;  Laterality: N/A;   EUS N/A 09/19/2020   Procedure: UPPER ENDOSCOPIC ULTRASOUND (EUS) LINEAR;  Surgeon: [name], MD;  Location: [facility];  Service:  Endoscopy;  Laterality: N/A;   FINE NEEDLE ASPIRATION  09/19/2020   Procedure: FINE NEEDLE ASPIRATION;  Surgeon: [name], MD;  Location: [facility];  Service: Endoscopy;;   FINE NEEDLE ASPIRATION N/A 09/26/2020   Procedure: FINE NEEDLE ASPIRATION (FNA) LINEAR;  Surgeon: [name], MD;  Location: [facility];  Service: Endoscopy;  Laterality: N/A;   FINGER SURGERY     SPHINCTEROTOMY  09/19/2020   Procedure: SPHINCTEROTOMY;  Surgeon: [name], MD;  Location: [facility];  Service: Endoscopy;;   UPPER ESOPHAGEAL ENDOSCOPIC ULTRASOUND (EUS) N/A 09/26/2020   Procedure: UPPER ESOPHAGEAL ENDOSCOPIC ULTRASOUND (EUS);  Surgeon: [name], MD;  Location: [facility];  Service: Endoscopy;  Laterality: N/A;    I have reviewed the social history and family history with the patient and they are unchanged from previous note.  ALLERGIES:  has No Known Allergies.  MEDICATIONS:  Current Outpatient Medications  Medication Sig Dispense Refill   amLODipine (NORVASC) 5 MG tablet Take 5 mg by mouth daily.     blood glucose meter kit and supplies KIT Dispense based on patient and insurance preference. Use up to four times daily as directed. 1 each 0   calcium carbonate (TUMS - DOSED IN MG ELEMENTAL CALCIUM) 500 MG chewable tablet Chew 500 mg by mouth daily as needed for indigestion or heartburn.     famotidine (PEPCID) 20 MG tablet Take 20 mg by mouth at bedtime.     furosemide (LASIX) 20 MG tablet Take 20 mg by mouth daily.     insulin glargine (LANTUS) 100 UNIT/ML Solostar Pen Inject 8 Units into the skin daily. 15 mL 0   levothyroxine (SYNTHROID) 75 MCG tablet Take 75 mcg by mouth daily before breakfast.     lisinopril (ZESTRIL) 10 MG tablet Take 10 mg by mouth daily.     metFORMIN (GLUCOPHAGE-XR) 500 MG 24 hr tablet Take 500 mg by mouth 2 (two) times daily.     mirtazapine (REMERON) 7.5 MG tablet Take 1 tablet (7.5 mg total) by mouth at bedtime. 30 tablet 0   ondansetron (ZOFRAN) 8 MG tablet Take 1 tablet (8 mg total) by mouth 2 (two) times daily as needed (Nausea or vomiting). 30 tablet 1   prochlorperazine (COMPAZINE) 10 MG tablet Take 1 tablet (10 mg total) by mouth every 6 (six) hours as needed (Nausea or vomiting). 30 tablet 1   traMADol (ULTRAM) 50 MG tablet  Take 50 mg by mouth at bedtime.     Vitamin D, Ergocalciferol, 50 MCG (2000 UT) CAPS Take 2,000 Units by mouth daily.     No current facility-administered medications for this visit.    PHYSICAL EXAMINATION: ECOG PERFORMANCE STATUS: 1 - Symptomatic but completely ambulatory  Vitals:   10/06/20 0940  BP: 135/64  Pulse: 72  Resp: 17  Temp: 98 F (36.7 C)  SpO2: 100%   Filed Weights   10/06/20 0940  Weight: 139 lb 6.4 oz (63.2 kg)    GENERAL:alert, no distress and comfortable SKIN: no rash  EYES:  sclera clear LUNGS:  normal breathing effort HEART:  no lower extremity edema ABDOMEN:abdomen soft, non-tender and normal bowel sounds NEURO: alert & oriented x 3 with fluent speech, no focal motor/sensory deficits No PAC  LABORATORY DATA:  I have reviewed the data as listed CBC Latest Ref Rng & Units 10/06/2020 09/22/2020 09/21/2020  WBC 4.0 - 10.5 K/uL 4.9 6.9 8.0  Hemoglobin 12.0 - 15.0 g/dL 12.2 10.8(L) 10.6(L)  Hematocrit 36.0 - 46.0 % 38.1 33.4(L) 32.7(L)  Platelets 150 - 400 K/uL 265 212 211     CMP Latest Ref Rng & Units 10/06/2020 09/22/2020 09/21/2020  Glucose 70 - 99 mg/dL 85 140(H) 160(H)  BUN 8 - 23 mg/dL _0 Creatinine 0.44 - 1.00 mg/dL 0.77 0.77 0.68  Sodium 135 - 145 mmol/L 139 139 139  Potassium 3.5 - 5.1 mmol/L 3.5 4.1 3.6  Chloride 98 - 111 mmol/L 103 105 105  CO2 22 - 32 mmol/L _1 Calcium 8.9 - 10.3 mg/dL 9.1 9.0 8.8(L)  Total Protein 6.5 - 8.1 g/dL 7.5 6.0(L) 6.0(L)  Total Bilirubin 0.3 - 1.2 mg/dL 2.4(H) 5.0(H) 7.1(H)  Alkaline Phos 38 - 126 U/L 200(H) 554(H) 621(H)  AST 15 - 41 U/L 30 82(H) 106(H)  ALT 0 - 44 U/L 25 121(H) 141(H)      RADIOGRAPHIC STUDIES: I have personally reviewed the radiological images as listed and agreed with the findings in the report. No results found.   ASSESSMENT & PLAN: Tina Patel is a 80 y.o.     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 Her initial EUS on 09/19/20 showed mass at head of pancreas, but biopsy was negative. She had ERCP stent placement same day. -Her repeat EUS biopsy of the mass on 09/26/20 showed adenocarcinoma from pancreatic primary. Her CT CAP showed mass to be 4.5cm. No evidence of metastatic disease in the chest, abdomen, or pelvis. She does have multiple benign cystic lesions in the liver.   -This is a potentially resectable and curable disease with Whipple surgery. Case was discussed in GI tumor board, due to portal vein invasion, Dr. Zenia Resides would not offer surgery.  A referral to [facility] for second surgical opinion with Dr. Mariah Milling is pending -Due to very high risk of recurrence after surgical resection alone, Dr. Burr Medico previously discussed treatment options and timing of neoadjuvant vs adjuvant chemo, chemo plus consolidation radiation, vs observation and palliative care alone. Patient decided to pursue chemo -pending gem/abraxane 2 weeks on/1 week off, first cycle with single agent gem (due to hyperbilirubinemia), starting 10/07/20   2. Obstructive Jaundice, Hyperbilirubinemia, Secondary to #1 -She had jaundice and elevated Tbili in early June 2022 during hospitalization, with RUQ pain controlled on tramadol  -S/p ERCP and stent placement 09/19/2020 with Dr. Benson Norway, symptoms much improved and jaundice resolved (10/06/2020)   3. Weight loss, Fatigue, Secondary to #1 -For the past 3-6 months she has had 25 pounds weight loss and more fatigued.  -She has low appetite, nausea and mild early satiety. She forces herself to remain active at home. -Prescribed mirtazapine, taking supplements Glucerna and Ensure -Follow-up with dietitian   4. 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 Genetic Testing -Given her pancreatic cancer she is eligible for genetic testing. She is interested.  -Labs done today  6. Social Support -She has great social support from her  husband (17) yo) who she lives with and her 3 children who live in town.   Disposition: Tina Patel appears stable.  Her abdominal pain has improved.  She is gaining weight.  CBC normalized, transaminitis and hyperbilirubinemia improving.  We reviewed the potential benefit and toxicities, symptom management, and goal of therapy.  Her case was discussed in tumor board, Dr. Michaelle Birks felt her case is too high risk given portal vein invasion and will not offer surgery, a second opinion with [facility] Dr.  Mariah Milling is pending.  If she is a surgical candidate, the goal of neoadjuvant chemo is curative.  If she is not a surgical candidate, or decides not to proceed with surgery in the future, the goal of chemo is palliative.  She attended chemo class and will pick up antiemetics today.  Labs adequate to proceed with cycle 1 day 1 single agent gemcitabine (for hyperbilirubinemia) on 10/07/2020 as scheduled.  Follow-up and day 8 next week.  We will do first cycle peripherally.   All questions were answered. The patient knows to call the clinic with any problems, questions or concerns. No barriers to learning were detected. Total encounter time was 25 minutes.      Alla Feeling, NP 10/06/20

## 2020-10-06 ENCOUNTER — Inpatient Hospital Stay: Payer: Medicare Other | Admitting: Nurse Practitioner

## 2020-10-06 ENCOUNTER — Other Ambulatory Visit: Payer: Medicare Other

## 2020-10-06 ENCOUNTER — Encounter: Payer: Medicare Other | Admitting: Licensed Clinical Social Worker

## 2020-10-06 ENCOUNTER — Inpatient Hospital Stay: Payer: Medicare Other

## 2020-10-06 ENCOUNTER — Other Ambulatory Visit: Payer: Self-pay

## 2020-10-06 ENCOUNTER — Encounter: Payer: Self-pay | Admitting: Nurse Practitioner

## 2020-10-06 VITALS — BP 135/64 | HR 72 | Temp 98.0°F | Resp 17 | Ht 67.0 in | Wt 139.4 lb

## 2020-10-06 DIAGNOSIS — C250 Malignant neoplasm of head of pancreas: Secondary | ICD-10-CM

## 2020-10-06 DIAGNOSIS — R634 Abnormal weight loss: Secondary | ICD-10-CM | POA: Diagnosis not present

## 2020-10-06 DIAGNOSIS — Z794 Long term (current) use of insulin: Secondary | ICD-10-CM | POA: Diagnosis not present

## 2020-10-06 DIAGNOSIS — E7800 Pure hypercholesterolemia, unspecified: Secondary | ICD-10-CM | POA: Diagnosis not present

## 2020-10-06 DIAGNOSIS — Z808 Family history of malignant neoplasm of other organs or systems: Secondary | ICD-10-CM | POA: Diagnosis not present

## 2020-10-06 DIAGNOSIS — Z801 Family history of malignant neoplasm of trachea, bronchus and lung: Secondary | ICD-10-CM | POA: Diagnosis not present

## 2020-10-06 DIAGNOSIS — E119 Type 2 diabetes mellitus without complications: Secondary | ICD-10-CM | POA: Diagnosis not present

## 2020-10-06 DIAGNOSIS — C259 Malignant neoplasm of pancreas, unspecified: Secondary | ICD-10-CM | POA: Diagnosis not present

## 2020-10-06 DIAGNOSIS — H9190 Unspecified hearing loss, unspecified ear: Secondary | ICD-10-CM | POA: Diagnosis not present

## 2020-10-06 DIAGNOSIS — E039 Hypothyroidism, unspecified: Secondary | ICD-10-CM | POA: Diagnosis not present

## 2020-10-06 DIAGNOSIS — R5383 Other fatigue: Secondary | ICD-10-CM | POA: Diagnosis not present

## 2020-10-06 DIAGNOSIS — K831 Obstruction of bile duct: Secondary | ICD-10-CM | POA: Diagnosis not present

## 2020-10-06 DIAGNOSIS — Z5111 Encounter for antineoplastic chemotherapy: Secondary | ICD-10-CM | POA: Diagnosis not present

## 2020-10-06 DIAGNOSIS — Z79899 Other long term (current) drug therapy: Secondary | ICD-10-CM | POA: Diagnosis not present

## 2020-10-06 DIAGNOSIS — I10 Essential (primary) hypertension: Secondary | ICD-10-CM | POA: Diagnosis not present

## 2020-10-06 DIAGNOSIS — K7689 Other specified diseases of liver: Secondary | ICD-10-CM | POA: Diagnosis not present

## 2020-10-06 LAB — CBC WITH DIFFERENTIAL (CANCER CENTER ONLY)
Abs Immature Granulocytes: 0.01 10*3/uL (ref 0.00–0.07)
Basophils Absolute: 0.1 10*3/uL (ref 0.0–0.1)
Basophils Relative: 1 %
Eosinophils Absolute: 0.3 10*3/uL (ref 0.0–0.5)
Eosinophils Relative: 6 %
HCT: 38.1 % (ref 36.0–46.0)
Hemoglobin: 12.2 g/dL (ref 12.0–15.0)
Immature Granulocytes: 0 %
Lymphocytes Relative: 34 %
Lymphs Abs: 1.7 10*3/uL (ref 0.7–4.0)
MCH: 28 pg (ref 26.0–34.0)
MCHC: 32 g/dL (ref 30.0–36.0)
MCV: 87.4 fL (ref 80.0–100.0)
Monocytes Absolute: 0.4 10*3/uL (ref 0.1–1.0)
Monocytes Relative: 7 %
Neutro Abs: 2.6 10*3/uL (ref 1.7–7.7)
Neutrophils Relative %: 52 %
Platelet Count: 265 10*3/uL (ref 150–400)
RBC: 4.36 MIL/uL (ref 3.87–5.11)
RDW: 13.8 % (ref 11.5–15.5)
WBC Count: 4.9 10*3/uL (ref 4.0–10.5)
nRBC: 0 % (ref 0.0–0.2)

## 2020-10-06 LAB — CMP (CANCER CENTER ONLY)
ALT: 25 U/L (ref 0–44)
AST: 30 U/L (ref 15–41)
Albumin: 4 g/dL (ref 3.5–5.0)
Alkaline Phosphatase: 200 U/L — ABNORMAL HIGH (ref 38–126)
Anion gap: 8 (ref 5–15)
BUN: 19 mg/dL (ref 8–23)
CO2: 28 mmol/L (ref 22–32)
Calcium: 9.1 mg/dL (ref 8.9–10.3)
Chloride: 103 mmol/L (ref 98–111)
Creatinine: 0.77 mg/dL (ref 0.44–1.00)
GFR, Estimated: >60 mL/min (ref >60)
Glucose, Bld: 85 mg/dL (ref 70–99)
Potassium: 3.5 mmol/L (ref 3.5–5.1)
Sodium: 139 mmol/L (ref 135–145)
Total Bilirubin: 2.4 mg/dL — ABNORMAL HIGH (ref 0.3–1.2)
Total Protein: 7.5 g/dL (ref 6.5–8.1)

## 2020-10-06 LAB — GENETIC SCREENING ORDER

## 2020-10-07 ENCOUNTER — Telehealth: Payer: Self-pay | Admitting: *Deleted

## 2020-10-07 ENCOUNTER — Telehealth: Payer: Self-pay | Admitting: Hematology

## 2020-10-07 ENCOUNTER — Inpatient Hospital Stay: Payer: Medicare Other

## 2020-10-07 ENCOUNTER — Ambulatory Visit: Payer: Medicare Other | Admitting: Physical Therapy

## 2020-10-07 VITALS — BP 131/55 | HR 71 | Temp 97.8°F | Resp 18

## 2020-10-07 DIAGNOSIS — C250 Malignant neoplasm of head of pancreas: Secondary | ICD-10-CM

## 2020-10-07 DIAGNOSIS — Z5111 Encounter for antineoplastic chemotherapy: Secondary | ICD-10-CM | POA: Diagnosis not present

## 2020-10-07 DIAGNOSIS — K7689 Other specified diseases of liver: Secondary | ICD-10-CM | POA: Diagnosis not present

## 2020-10-07 DIAGNOSIS — R5383 Other fatigue: Secondary | ICD-10-CM | POA: Diagnosis not present

## 2020-10-07 DIAGNOSIS — Z79899 Other long term (current) drug therapy: Secondary | ICD-10-CM | POA: Diagnosis not present

## 2020-10-07 DIAGNOSIS — E119 Type 2 diabetes mellitus without complications: Secondary | ICD-10-CM | POA: Diagnosis not present

## 2020-10-07 DIAGNOSIS — K831 Obstruction of bile duct: Secondary | ICD-10-CM | POA: Diagnosis not present

## 2020-10-07 DIAGNOSIS — Z801 Family history of malignant neoplasm of trachea, bronchus and lung: Secondary | ICD-10-CM | POA: Diagnosis not present

## 2020-10-07 DIAGNOSIS — I10 Essential (primary) hypertension: Secondary | ICD-10-CM | POA: Diagnosis not present

## 2020-10-07 DIAGNOSIS — Z808 Family history of malignant neoplasm of other organs or systems: Secondary | ICD-10-CM | POA: Diagnosis not present

## 2020-10-07 DIAGNOSIS — R634 Abnormal weight loss: Secondary | ICD-10-CM | POA: Diagnosis not present

## 2020-10-07 DIAGNOSIS — Z794 Long term (current) use of insulin: Secondary | ICD-10-CM | POA: Diagnosis not present

## 2020-10-07 DIAGNOSIS — E039 Hypothyroidism, unspecified: Secondary | ICD-10-CM | POA: Diagnosis not present

## 2020-10-07 DIAGNOSIS — H9190 Unspecified hearing loss, unspecified ear: Secondary | ICD-10-CM | POA: Diagnosis not present

## 2020-10-07 DIAGNOSIS — E7800 Pure hypercholesterolemia, unspecified: Secondary | ICD-10-CM | POA: Diagnosis not present

## 2020-10-07 MED ORDER — PROCHLORPERAZINE MALEATE 10 MG PO TABS
ORAL_TABLET | ORAL | Status: AC
  Filled 2020-10-07: qty 1

## 2020-10-07 MED ORDER — SODIUM CHLORIDE 0.9 % IV SOLN
Freq: Once | INTRAVENOUS | Status: AC
  Filled 2020-10-07: qty 250

## 2020-10-07 MED ORDER — SODIUM CHLORIDE 0.9 % IV SOLN: 1000.0000 mg/m2 | Freq: Once | INTRAVENOUS | Status: DC

## 2020-10-07 MED ORDER — SODIUM CHLORIDE 0.9 % IV SOLN
800.0000 mg/m2 | Freq: Once | INTRAVENOUS | Status: AC
  Administered 2020-10-07: 1368 mg via INTRAVENOUS
  Filled 2020-10-07: qty 35.98

## 2020-10-07 MED ORDER — PROCHLORPERAZINE MALEATE 10 MG PO TABS
10.0000 mg | ORAL_TABLET | Freq: Once | ORAL | Status: AC
  Administered 2020-10-07: 10 mg via ORAL

## 2020-10-07 NOTE — Patient Instructions (Signed)
[facility]ICAL [facility]COLOG[facility]  Discharge Instructions: Thank you for choosing [facility] to provide your oncology and hematology care.   If you have a lab appointment with the Cancer Center, please go directly to the Cancer Center and check in at the registration area.   Wear comfortable clothing and clothing appropriate for easy access to any Portacath or PICC line.   We strive to give you quality time with your provider. You may need to reschedule your appointment if you arrive late (15 or more minutes).  Arriving late affects you and other patients whose appointments are after yours.  Also, if you miss three or more appointments without notifying the office, you may be dismissed from the clinic at the provider's discretion.      For prescription refill requests, have your pharmacy contact our office and allow 72 hours for refills to be completed.    Today you received the following chemotherapy and/or immunotherapy agents gemzar      To help prevent nausea and vomiting after your treatment, we encourage you to take your nausea medication as directed.  BELOW ARE SYMPTOMS THAT SHOULD BE REPORTED IMMEDIATELY: *FEVER GREATER THAN 100.4 F (38 C) OR HIGHER *CHILLS OR SWEATING *NAUSEA AND VOMITING THAT IS NOT CONTROLLED WITH YOUR NAUSEA MEDICATION *UNUSUAL SHORTNESS OF BREATH *UNUSUAL BRUISING OR BLEEDING *URINARY PROBLEMS (pain or burning when urinating, or frequent urination) *BOWEL PROBLEMS (unusual diarrhea, constipation, pain near the anus) TENDERNESS IN MOUTH AND THROAT WITH OR WITHOUT PRESENCE OF ULCERS (sore throat, sores in mouth, or a toothache) UNUSUAL RASH, SWELLING OR PAIN  UNUSUAL VAGINAL DISCHARGE OR ITCHING   Items with * indicate a potential emergency and should be followed up as soon as possible or go to the Emergency Department if any problems should occur.  Please show the CHEMOTHERAPY ALERT CARD or IMMUNOTHERAPY ALERT CARD at check-in to the  Emergency Department and triage nurse.  Should you have questions after your visit or need to cancel or reschedule your appointment, please contact [facility]  Dept: [phone]  and follow the prompts.  Office hours are 8:00 a.m. to 4:30 p.m. Monday - Friday. Please note that voicemails left after 4:00 p.m. may not be returned until the following business day.  We are closed weekends and major holidays. You have access to a nurse at all times for urgent questions. Please call the main number to the clinic Dept: [phone] and follow the prompts.   For any non-urgent questions, you may also contact your provider using MyChart. We now offer e-Visits for anyone 18 and older to request care online for non-urgent symptoms. For details visit [email or web address].   Also download the MyChart app! Go to the app store, search "MyChart", open the app, select , and log in with your MyChart username and password.  Due to Covid, a mask is required upon entering the hospital/clinic. If you do not have a mask, one will be given to you upon arrival. For doctor visits, patients may have 1 support person aged 18 or older with them. For treatment visits, patients cannot have anyone with them due to current Covid guidelines and our immunocompromised population.   

## 2020-10-07 NOTE — Telephone Encounter (Signed)
Pt's daughter called with questions about [place] [facility].  Discussed process & need.  She reports that they cancelled rehab appt for today after chemo b/c they felt she would be too tired.  Informed that they could call & r/s if needed or discuss with Dr Burr Medico on Monday.  She also reports pt is almost completely deaf.

## 2020-10-07 NOTE — Progress Notes (Signed)
Ok per Dr. Burr Medico to reduce Gemzar dose C1D1 for Tbili and adv age (800 mg/m2).  Kennith Center, Pharm.D., CPP 10/07/2020@8 :37 AM

## 2020-10-07 NOTE — Telephone Encounter (Signed)
Scheduled follow-up appointments per 6/20 los. Patient's daughter is aware.

## 2020-10-08 ENCOUNTER — Ambulatory Visit: Payer: Medicare Other | Admitting: Hematology

## 2020-10-08 ENCOUNTER — Ambulatory Visit: Payer: Medicare Other

## 2020-10-08 ENCOUNTER — Other Ambulatory Visit: Payer: Medicare Other

## 2020-10-08 NOTE — Progress Notes (Addendum)
Tina Patel   [phone] [phone]   Clinic Follow up Note   Patient Care Team: [name], DO as PCP - General (Family Medicine) [name], RN as Oncology Nurse Navigator [name], MD as Consulting Physician (Oncology) [name], MD as Consulting Physician (Gastroenterology)  Date of Service:  10/13/2020  CHIEF COMPLAINT: F/u of pancreatic cancer   SUMMARY OF ONCOLOGIC HISTORY: Oncology History Overview Note  Cancer Staging Pancreatic cancer [facility]) Staging form: Exocrine Pancreas, AJCC 8th Edition - Clinical stage from 09/26/2020: Stage IB (cT2, cN0, cM0) - Signed by [name], MD on 09/29/2020 Stage prefix: Initial diagnosis Total positive nodes: 0    Pancreatic cancer ([place])  09/18/2020 Imaging   US Abdomen  IMPRESSION: Intrahepatic and extrahepatic biliary ductal dilatation. Common bile duct dilated up to 13 mm. This could be further evaluated with MRCP or ERCP.   Sludge within the gallbladder with associated gallbladder wall thickening and pericholecystic fluid. Cannot exclude acute cholecystitis.   Multiple hepatic cysts. Complex cystic area posteriorly in the right hepatic lobe. This could also be further evaluated with MRI.   09/19/2020 Procedure   EUS by Dr hung  IMPRESSION - A mass was identified in the pancreatic head. The staging applies if malignancy is confirmed. Fine needle aspiration performed. - There was dilation in the common hepatic duct which measured up to 13 mm. - There was dilation in the intrahepatic bile ducts, diffusely. - A cyst was found in the left lobe of the liver and measured 24 mm by 15 mm. - Endosonographic images of the left adrenal gland were unremarkable.    A. PANCREAS, HEAD, FINE NEEDLE ASPIRATION:    FINAL MICROSCOPIC DIAGNOSIS:  - Benign reactive/reparative changes  - Bland spindle cell fragments   SPECIMEN ADEQUACY:  Satisfactory for evaluation   IMMEDIATE EVALUATION:   SPINDLE FRAGMENTS AND BENIGN GLANDULAR FRAGMENTS. NO DEFINITIVE  MALIGNANCY. ([facility])   DIAGNOSTIC COMMENTS:  There are fragments of bland spindle cells, favored to represent smooth  muscle tissue. The glandular fragments are also bland and thus, there is  no definitive malignancy identified   B. BILIARY, STRICTURE, BRUSHING:    FINAL MICROSCOPIC DIAGNOSIS:  - Benign reactive/reparative changes   09/19/2020 Procedure   ERCP by Dr Benson Norway  IMPRESSION - The major papilla appeared normal. - A biliary sphincterotomy was performed. - Cells for cytology obtained distal CBD. - One covered metal stent was placed into the common bile duct.   09/20/2020 Imaging   MRI Abdomen  IMPRESSION: 1. 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 Recommend ERCP/EUS or tissue sampling. 2. There is mild biliary ductal dilatation of the common hepatic bile duct and the intrahepatic bile ducts in the LEFT hepatic lobe. Sequences are severely degraded by patient motion. Concern for obstruction of the common bile duct by the pancreatic head mass. 3. Multiple cystic lesions throughout the liver parenchyma. Assessment of enhancement is difficult due to patient motion as well as no true noncontrast T1 weighted imaging as described above. Favor complex benign cysts but consider liver protocol contrast CT for further evaluation of the cystic lesions to exclude metastasis.   09/21/2020 Imaging   CT CAP  IMPRESSION: 1. Heterogeneous mass of the central pancreatic head measuring 4.5 x 3.3 cm. There is atrophy of the distal pancreatic parenchyma with diffuse pancreatic ductal dilatation up to 0.8 cm. Mass appears to closely abut the lateral surface of  the portal vein near the confluence. The superior mesenteric artery is well separated by a fat plane, as is the hepatic artery. Findings are consistent with pancreatic  adenocarcinoma. The exact borders and size of the mass are better delineated by prior MR. 2. No evidence of metastatic disease in the chest, abdomen, or pelvis. 3. There are numerous lobulated, fluid attenuating cysts throughout the hepatic parenchyma, numerous additional subcentimeter lesions are too small to characterize although demonstrate no evidence of contrast enhancement and are likely additional subcentimeter cysts. Attention on follow-up. 4. Status post common bile duct stent with post stenting pneumobilia. 5. Thickening and fat stranding about the gallbladder as well as about the adjacent duodenal bulb and descending duodenum, of uncertain etiology, concerning for cholecystitis, enteritis, and/or pancreatitis, possibly related to recent endoscopic procedure.   Aortic Atherosclerosis (ICD10-I70.0).   09/26/2020 Cancer Staging   Staging form: Exocrine Pancreas, AJCC 8th Edition - Clinical stage from 09/26/2020: Stage IIA (cT3, cN0, cM0) - Signed by [name], MD on 09/29/2020  Stage prefix: Initial diagnosis  Total positive nodes: 0    09/26/2020 Procedure   EUS by Dr Benson Norway  IMPRESSION - A mass was identified in the pancreatic head. Cytology results are pending. However, the endosonographic appearance is consistent with adenocarcinoma. Fine needle aspiration performed.   09/26/2020 Initial Biopsy   A. PANCREAS, HEAD, FINE NEEDLE ASPIRATION:    FINAL MICROSCOPIC DIAGNOSIS:  - Malignant cells consistent with adenocarcinoma    09/29/2020 Initial Diagnosis   Pancreatic cancer ([place])    10/07/2020 -  Chemotherapy   First-line Gemcitabine and Abraxane 2 weeks on/1 week off starting 10/07/20.  C1 given with Gemcitabine alone.       CURRENT THERAPY:  First-line Gemcitabine and Abraxane 2 weeks on/1 week off starting 10/07/20. C1 given with Gemcitabine alone.   INTERVAL HISTORY:  Tina Patel is here for a follow up of pancreatic cancer. She was last seen by me 09/29/20.  She presents to the clinic with her husband. She is very hard of hearing. She notes she tolerated first dose chemo well. She notes she has been itching for the past 2 days in her back and her upper chest. There is no rash.    REVIEW OF SYSTEMS:   Constitutional: Denies fevers, chills or abnormal weight loss Eyes: Denies blurriness of vision Ears, nose, mouth, throat, and face: Denies mucositis or sore throat Respiratory: Denies cough, dyspnea or wheezes Cardiovascular: Denies palpitation, chest discomfort or lower extremity swelling Gastrointestinal:  Denies nausea, heartburn or change in bowel habits Skin: Denies abnormal skin rashes (+) Skin itching of her back  Lymphatics: Denies new lymphadenopathy or easy bruising Neurological:Denies numbness, tingling or new weaknesses Behavioral/Psych: Mood is stable, no new changes  All other systems were reviewed with the patient and are negative.  MEDICAL HISTORY:  Past Medical History:  Diagnosis Date   Diabetes ([place])    Diabetes mellitus    Family history of colon cancer    Family history of lung cancer    Family history of multiple myeloma    Family history of prostate cancer    High cholesterol    Hypertension    Hypothyroid     SURGICAL HISTORY: Past Surgical History:  Procedure Laterality Date   BILIARY BRUSHING  09/19/2020   Procedure: BILIARY BRUSHING;  Surgeon: [name], MD;  Location: [facility];  Service: Endoscopy;;   BILIARY STENT PLACEMENT N/A 09/19/2020   Procedure: BILIARY STENT PLACEMENT;  Surgeon: [name], MD;  Location:  [facility];  Service: Endoscopy;  Laterality: N/A;   ERCP N/A 09/19/2020   Procedure: ENDOSCOPIC RETROGRADE CHOLANGIOPANCREATOGRAPHY (ERCP);  Surgeon: [name], MD;  Location: [facility];  Service: Endoscopy;  Laterality: N/A;   ESOPHAGOGASTRODUODENOSCOPY (EGD) WITH PROPOFOL N/A 09/19/2020   Procedure: ESOPHAGOGASTRODUODENOSCOPY (EGD) WITH PROPOFOL;  Surgeon: [name], MD;   Location: [facility];  Service: Endoscopy;  Laterality: N/A;   ESOPHAGOGASTRODUODENOSCOPY (EGD) WITH PROPOFOL N/A 09/26/2020   Procedure: ESOPHAGOGASTRODUODENOSCOPY (EGD) WITH PROPOFOL;  Surgeon: [name], MD;  Location: [facility];  Service: Endoscopy;  Laterality: N/A;   EUS N/A 09/19/2020   Procedure: UPPER ENDOSCOPIC ULTRASOUND (EUS) LINEAR;  Surgeon: [name], MD;  Location: [facility];  Service: Endoscopy;  Laterality: N/A;   FINE NEEDLE ASPIRATION  09/19/2020   Procedure: FINE NEEDLE ASPIRATION;  Surgeon: [name], MD;  Location: [facility];  Service: Endoscopy;;   FINE NEEDLE ASPIRATION N/A 09/26/2020   Procedure: FINE NEEDLE ASPIRATION (FNA) LINEAR;  Surgeon: [name], MD;  Location: [facility];  Service: Endoscopy;  Laterality: N/A;   FINGER SURGERY     SPHINCTEROTOMY  09/19/2020   Procedure: SPHINCTEROTOMY;  Surgeon: [name], MD;  Location: [facility];  Service: Endoscopy;;   UPPER ESOPHAGEAL ENDOSCOPIC ULTRASOUND (EUS) N/A 09/26/2020   Procedure: UPPER ESOPHAGEAL ENDOSCOPIC ULTRASOUND (EUS);  Surgeon: [name], MD;  Location: [facility];  Service: Endoscopy;  Laterality: N/A;    I have reviewed the social history and family history with the patient and they are unchanged from previous note.  ALLERGIES:  has No Known Allergies.  MEDICATIONS:  Current Outpatient Medications  Medication Sig Dispense Refill   amLODipine (NORVASC) 5 MG tablet Take 5 mg by mouth daily.     blood glucose meter kit and supplies KIT Dispense based on patient and insurance preference. Use up to four times daily as directed. 1 each 0   calcium carbonate (TUMS - DOSED IN MG ELEMENTAL CALCIUM) 500 MG chewable tablet Chew 500 mg by mouth daily as needed for indigestion or heartburn.     famotidine (PEPCID) 20 MG tablet Take 20 mg by mouth at bedtime.     furosemide (LASIX) 20 MG tablet Take 20 mg by mouth daily.     insulin glargine (LANTUS) 100 UNIT/ML Solostar Pen  Inject 8 Units into the skin daily. 15 mL 0   levothyroxine (SYNTHROID) 75 MCG tablet Take 75 mcg by mouth daily before breakfast.     lisinopril (ZESTRIL) 10 MG tablet Take 10 mg by mouth daily.     metFORMIN (GLUCOPHAGE-XR) 500 MG 24 hr tablet Take 500 mg by mouth 2 (two) times daily.     mirtazapine (REMERON) 7.5 MG tablet Take 1 tablet (7.5 mg total) by mouth at bedtime. 30 tablet 0   ondansetron (ZOFRAN) 8 MG tablet Take 1 tablet (8 mg total) by mouth 2 (two) times daily as needed (Nausea or vomiting). 30 tablet 1   prochlorperazine (COMPAZINE) 10 MG tablet Take 1 tablet (10 mg total) by mouth every 6 (six) hours as needed (Nausea or vomiting). 30 tablet 1   traMADol (ULTRAM) 50 MG tablet Take 50 mg by mouth at bedtime.     Vitamin D, Ergocalciferol, 50 MCG (2000 UT) CAPS Take 2,000 Units by mouth daily.     No current facility-administered medications for this visit.    PHYSICAL EXAMINATION: ECOG PERFORMANCE STATUS: 1 - Symptomatic but completely ambulatory  Vitals:   10/13/20 1215  BP: 115/66  Pulse: 76  Resp: 18  Temp:  97.9 F (36.6 C)  SpO2: 100%   Filed Weights   10/13/20 1215  Weight: 137 lb 9.6 oz (62.4 kg)    GENERAL:alert, no distress and comfortable SKIN: skin color, texture, turgor are normal, no rashes or significant lesions EYES: normal, Conjunctiva are pink and non-injected, sclera clear  NECK: supple, thyroid normal size, non-tender, without nodularity LYMPH:  no palpable lymphadenopathy in the cervical, axillary  LUNGS: clear to auscultation and percussion with normal breathing effort HEART: regular rate & rhythm and no murmurs and no lower extremity edema ABDOMEN:abdomen soft, non-tender and normal bowel sounds Musculoskeletal:no cyanosis of digits and no clubbing  NEURO: alert & oriented x 3 with fluent speech, no focal motor/sensory deficits  LABORATORY DATA:  I have reviewed the data as listed CBC Latest Ref Rng & Units 10/13/2020 10/06/2020 09/22/2020   WBC 4.0 - 10.5 K/uL 2.8(L) 4.9 6.9  Hemoglobin 12.0 - 15.0 g/dL 11.2(L) 12.2 10.8(L)  Hematocrit 36.0 - 46.0 % 33.4(L) 38.1 33.4(L)  Platelets 150 - 400 K/uL 230 265 212     CMP Latest Ref Rng & Units 10/13/2020 10/06/2020 09/22/2020  Glucose 70 - 99 mg/dL 98 85 140(H)  BUN 8 - 23 mg/dL 14 19 17   Creatinine 0.44 - 1.00 mg/dL 0.82 0.77 0.77  Sodium 135 - 145 mmol/L 141 139 139  Potassium 3.5 - 5.1 mmol/L 4.6 3.5 4.1  Chloride 98 - 111 mmol/L 103 103 105  CO2 22 - 32 mmol/L 26 28 25   Calcium 8.9 - 10.3 mg/dL 9.3 9.1 9.0  Total Protein 6.5 - 8.1 g/dL 6.9 7.5 6.0(L)  Total Bilirubin 0.3 - 1.2 mg/dL 1.5(H) 2.4(H) 5.0(H)  Alkaline Phos 38 - 126 U/L 155(H) 200(H) 554(H)  AST 15 - 41 U/L 26 30 82(H)  ALT 0 - 44 U/L 19 25 121(H)      RADIOGRAPHIC STUDIES: I have personally reviewed the radiological images as listed and agreed with the findings in the report. No results found.   ASSESSMENT & PLAN:  Tina Patel is a 80 y.o. 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 Her initial EUS on 09/19/20 showed mass at head of pancreas, but biopsy was negative. She had ERCP stent placement same day. -Her repeat EUS biopsy of the mass on 09/26/20 showed adenocarcinoma from pancreatic primary. Her CT CAP showed mass to be 4.5cm. No evidence of metastatic disease in the chest, abdomen, or pelvis. She does have multiple benign cystic lesions in the liver.  -I discussed the aggressive nature of pancreatic cancer, her cancer staging and prognosis.  This is a potentially resectable and curable disease with Whipple surgery.  Case was discussed in GI tumor board, neoadjuvant chemotherapy was recommended, and our local surgeons recommend her to be referred to a tertiary cancer center for Whipple surgery evaluation.  I have referred her to Dr. Mariah Milling at [facility], appointment is pending. -I  started her on Gemcitabine 2 weeks on/1 week off starting 10/07/20. May add Abraxane from C2 if tolerable.  -She tolerated first week of Gemcitabine well with no major issues. She does have skin itching without rash of back and upper torso. For possible mild reaction, will start her on benadryl.  -Labs reviewed, WBC 2.8, Hg 11.2, ANC 0.9, albumin 3.1, Alk Phos 155, Tbili 1.5. Overall adequate to proceed with C1D8 Gemcitabine today at same dose.  -F/u in 2 weeks with C2D1.  Plan to add Abraxane 100 mg/m to next cycle    2. Obstructive Jaundice, Hyperbilirubinemia, Secondary to #1 -She was seen jaundice and elevated Tbili in early June 2022 hospitalization. -She also had RUQ pain recently. This is controlled on Tramadol. -Symptoms much improved after ERCP Stent placement on 09/19/20 with Dr Benson Norway.  -Her LFTs much improved. Tbili 1.5 (10/13/20)   3. Weight loss, Fatigue, Secondary to #1 -For the past 3-6 months she has had 25 pounds weight loss and more fatigued. She is fine with her weight loss, but willing to gain back to 140 pounds. -She has low appetite, nausea and mild early satiety. She forces herself to remain active at home. -I recommend Glucerna for nutritional supplement and appetite stimulant Mirtazapine. She is agreeable. I recommend smaller 3-5 meals a day. Continue to f/u with dietician.    4. 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 Genetic Testing -Given her pancreatic cancer she is eligible for genetic testing. She is interested. She will be seen by Genetics today (10/13/20).    6. Social Support -She has great social support from her husband (88) yo) who she lives with and her 3 children who live in town. Her youngest, Levada Dy came with her in clinic today (09/29/20)      PLAN:  -Start Benadryl today for skin itching (will add it to chemo premeds) -Labs reviewed and adequate to proceed with C1D8 Gemcitabine alone with same dose  -Lab, f/u and Gem/Abraxane  in 2 and 3 weeks with dose reduction     No problem-specific Assessment & Plan notes found for this encounter.   No orders of the defined types were placed in this encounter.  All questions were answered. The patient knows to call the clinic with any problems, questions or concerns. No barriers to learning was detected. The total time spent in the appointment was 30 minutes.     [name], MD 10/13/2020   I, Joslyn Devon, am acting as scribe for [name], MD.   I have reviewed the above documentation for accuracy and completeness, and I agree with the above.

## 2020-10-13 ENCOUNTER — Inpatient Hospital Stay (HOSPITAL_BASED_OUTPATIENT_CLINIC_OR_DEPARTMENT_OTHER): Payer: Medicare Other | Admitting: Licensed Clinical Social Worker

## 2020-10-13 ENCOUNTER — Inpatient Hospital Stay: Payer: Medicare Other

## 2020-10-13 ENCOUNTER — Inpatient Hospital Stay: Payer: Medicare Other | Admitting: Hematology

## 2020-10-13 ENCOUNTER — Encounter: Payer: Self-pay | Admitting: Hematology

## 2020-10-13 ENCOUNTER — Other Ambulatory Visit: Payer: Self-pay

## 2020-10-13 ENCOUNTER — Encounter: Payer: Self-pay | Admitting: Licensed Clinical Social Worker

## 2020-10-13 ENCOUNTER — Other Ambulatory Visit: Payer: Medicare Other

## 2020-10-13 VITALS — BP 115/66 | HR 76 | Temp 97.9°F | Resp 18 | Wt 137.6 lb

## 2020-10-13 DIAGNOSIS — Z5111 Encounter for antineoplastic chemotherapy: Secondary | ICD-10-CM | POA: Diagnosis not present

## 2020-10-13 DIAGNOSIS — K831 Obstruction of bile duct: Secondary | ICD-10-CM | POA: Diagnosis not present

## 2020-10-13 DIAGNOSIS — C250 Malignant neoplasm of head of pancreas: Secondary | ICD-10-CM

## 2020-10-13 DIAGNOSIS — K7689 Other specified diseases of liver: Secondary | ICD-10-CM | POA: Diagnosis not present

## 2020-10-13 DIAGNOSIS — Z8042 Family history of malignant neoplasm of prostate: Secondary | ICD-10-CM | POA: Insufficient documentation

## 2020-10-13 DIAGNOSIS — Z807 Family history of other malignant neoplasms of lymphoid, hematopoietic and related tissues: Secondary | ICD-10-CM | POA: Insufficient documentation

## 2020-10-13 DIAGNOSIS — Z800 Family history of malignant neoplasm of digestive organs: Secondary | ICD-10-CM

## 2020-10-13 DIAGNOSIS — E039 Hypothyroidism, unspecified: Secondary | ICD-10-CM | POA: Diagnosis not present

## 2020-10-13 DIAGNOSIS — Z801 Family history of malignant neoplasm of trachea, bronchus and lung: Secondary | ICD-10-CM

## 2020-10-13 DIAGNOSIS — Z79899 Other long term (current) drug therapy: Secondary | ICD-10-CM | POA: Diagnosis not present

## 2020-10-13 DIAGNOSIS — E119 Type 2 diabetes mellitus without complications: Secondary | ICD-10-CM | POA: Diagnosis not present

## 2020-10-13 DIAGNOSIS — H9190 Unspecified hearing loss, unspecified ear: Secondary | ICD-10-CM | POA: Diagnosis not present

## 2020-10-13 DIAGNOSIS — Z794 Long term (current) use of insulin: Secondary | ICD-10-CM | POA: Diagnosis not present

## 2020-10-13 DIAGNOSIS — R5383 Other fatigue: Secondary | ICD-10-CM | POA: Diagnosis not present

## 2020-10-13 DIAGNOSIS — R634 Abnormal weight loss: Secondary | ICD-10-CM | POA: Diagnosis not present

## 2020-10-13 DIAGNOSIS — Z808 Family history of malignant neoplasm of other organs or systems: Secondary | ICD-10-CM | POA: Diagnosis not present

## 2020-10-13 DIAGNOSIS — I10 Essential (primary) hypertension: Secondary | ICD-10-CM | POA: Diagnosis not present

## 2020-10-13 DIAGNOSIS — E7800 Pure hypercholesterolemia, unspecified: Secondary | ICD-10-CM | POA: Diagnosis not present

## 2020-10-13 LAB — CBC WITH DIFFERENTIAL (CANCER CENTER ONLY)
Abs Immature Granulocytes: 0 10*3/uL (ref 0.00–0.07)
Basophils Absolute: 0 10*3/uL (ref 0.0–0.1)
Basophils Relative: 0 %
Eosinophils Absolute: 0.2 10*3/uL (ref 0.0–0.5)
Eosinophils Relative: 8 %
HCT: 33.4 % — ABNORMAL LOW (ref 36.0–46.0)
Hemoglobin: 11.2 g/dL — ABNORMAL LOW (ref 12.0–15.0)
Immature Granulocytes: 0 %
Lymphocytes Relative: 51 %
Lymphs Abs: 1.4 10*3/uL (ref 0.7–4.0)
MCH: 28.9 pg (ref 26.0–34.0)
MCHC: 33.5 g/dL (ref 30.0–36.0)
MCV: 86.1 fL (ref 80.0–100.0)
Monocytes Absolute: 0.3 10*3/uL (ref 0.1–1.0)
Monocytes Relative: 9 %
Neutro Abs: 0.9 10*3/uL — ABNORMAL LOW (ref 1.7–7.7)
Neutrophils Relative %: 32 %
Platelet Count: 230 10*3/uL (ref 150–400)
RBC: 3.88 MIL/uL (ref 3.87–5.11)
RDW: 13.4 % (ref 11.5–15.5)
WBC Count: 2.8 10*3/uL — ABNORMAL LOW (ref 4.0–10.5)
nRBC: 0 % (ref 0.0–0.2)

## 2020-10-13 LAB — CMP (CANCER CENTER ONLY)
ALT: 19 U/L (ref 0–44)
AST: 26 U/L (ref 15–41)
Albumin: 3.1 g/dL — ABNORMAL LOW (ref 3.5–5.0)
Alkaline Phosphatase: 155 U/L — ABNORMAL HIGH (ref 38–126)
Anion gap: 12 (ref 5–15)
BUN: 14 mg/dL (ref 8–23)
CO2: 26 mmol/L (ref 22–32)
Calcium: 9.3 mg/dL (ref 8.9–10.3)
Chloride: 103 mmol/L (ref 98–111)
Creatinine: 0.82 mg/dL (ref 0.44–1.00)
GFR, Estimated: >60 mL/min (ref >60)
Glucose, Bld: 98 mg/dL (ref 70–99)
Potassium: 4.6 mmol/L (ref 3.5–5.1)
Sodium: 141 mmol/L (ref 135–145)
Total Bilirubin: 1.5 mg/dL — ABNORMAL HIGH (ref 0.3–1.2)
Total Protein: 6.9 g/dL (ref 6.5–8.1)

## 2020-10-13 MED ORDER — PROCHLORPERAZINE MALEATE 10 MG PO TABS
ORAL_TABLET | ORAL | Status: AC
  Filled 2020-10-13: qty 1

## 2020-10-13 MED ORDER — SODIUM CHLORIDE 0.9 % IV SOLN
Freq: Once | INTRAVENOUS | Status: AC
  Filled 2020-10-13: qty 250

## 2020-10-13 MED ORDER — DIPHENHYDRAMINE HCL 25 MG PO CAPS
25.0000 mg | ORAL_CAPSULE | Freq: Once | ORAL | Status: AC
  Administered 2020-10-13: 25 mg via ORAL

## 2020-10-13 MED ORDER — DIPHENHYDRAMINE HCL 25 MG PO CAPS
ORAL_CAPSULE | ORAL | Status: AC
  Filled 2020-10-13: qty 1

## 2020-10-13 MED ORDER — PROCHLORPERAZINE MALEATE 10 MG PO TABS
10.0000 mg | ORAL_TABLET | Freq: Once | ORAL | Status: AC
  Administered 2020-10-13: 10 mg via ORAL

## 2020-10-13 MED ORDER — SODIUM CHLORIDE 0.9 % IV SOLN
800.0000 mg/m2 | Freq: Once | INTRAVENOUS | Status: AC
  Administered 2020-10-13: 1368 mg via INTRAVENOUS
  Filled 2020-10-13: qty 35.98

## 2020-10-13 NOTE — Patient Instructions (Signed)
[facility]  Discharge Instructions: Thank you for choosing [facility] to provide your oncology and hematology care.   If you have a lab appointment with the Cancer Center, please go directly to the Cancer Center and check in at the registration area.   Wear comfortable clothing and clothing appropriate for easy access to any Portacath or PICC line.   We strive to give you quality time with your provider. You may need to reschedule your appointment if you arrive late (15 or more minutes).  Arriving late affects you and other patients whose appointments are after yours.  Also, if you miss three or more appointments without notifying the office, you may be dismissed from the clinic at the provider's discretion.      For prescription refill requests, have your pharmacy contact our office and allow 72 hours for refills to be completed.    Today you received the following chemotherapy and/or immunotherapy agents Gemzar      To help prevent nausea and vomiting after your treatment, we encourage you to take your nausea medication as directed.  BELOW ARE SYMPTOMS THAT SHOULD BE REPORTED IMMEDIATELY: *FEVER GREATER THAN 100.4 F (38 C) OR HIGHER *CHILLS OR SWEATING *NAUSEA AND VOMITING THAT IS NOT CONTROLLED WITH YOUR NAUSEA MEDICATION *UNUSUAL SHORTNESS OF BREATH *UNUSUAL BRUISING OR BLEEDING *URINARY PROBLEMS (pain or burning when urinating, or frequent urination) *BOWEL PROBLEMS (unusual diarrhea, constipation, pain near the anus) TENDERNESS IN MOUTH AND THROAT WITH OR WITHOUT PRESENCE OF ULCERS (sore throat, sores in mouth, or a toothache) UNUSUAL RASH, SWELLING OR PAIN  UNUSUAL VAGINAL DISCHARGE OR ITCHING   Items with * indicate a potential emergency and should be followed up as soon as possible or go to the Emergency Department if any problems should occur.  Please show the CHEMOTHERAPY ALERT CARD or IMMUNOTHERAPY ALERT CARD at check-in to the  Emergency Department and triage nurse.  Should you have questions after your visit or need to cancel or reschedule your appointment, please contact [facility]  Dept: [phone]  and follow the prompts.  Office hours are 8:00 a.m. to 4:30 p.m. Monday - Friday. Please note that voicemails left after 4:00 p.m. may not be returned until the following business day.  We are closed weekends and major holidays. You have access to a nurse at all times for urgent questions. Please call the main number to the clinic Dept: [phone] and follow the prompts.   For any non-urgent questions, you may also contact your provider using MyChart. We now offer e-Visits for anyone 18 and older to request care online for non-urgent symptoms. For details visit [email or web address].   Also download the MyChart app! Go to the app store, search "MyChart", open the app, select [place], and log in with your MyChart username and password.  Due to Covid, a mask is required upon entering the hospital/clinic. If you do not have a mask, one will be given to you upon arrival. For doctor visits, patients may have 1 support person aged 18 or older with them. For treatment visits, patients cannot have anyone with them due to current Covid guidelines and our immunocompromised population.   

## 2020-10-13 NOTE — Progress Notes (Signed)
REFERRING PROVIDER: [name], MD [place],  [place] [address]  PRIMARY PROVIDER:  [name], DO  PRIMARY REASON FOR VISIT:  1. Malignant neoplasm of head of pancreas ([place])   2. Family history of prostate cancer   3. Family history of lung cancer   4. Family history of multiple myeloma   5. Family history of colon cancer      HISTORY OF PRESENT ILLNESS:   Tina Patel, a 80 y.o. female, was seen for a [place] cancer genetics consultation at the request of Dr. Burr Medico due to a personal and family history of cancer.  Tina Patel presents to clinic today to discuss the possibility of a hereditary predisposition to cancer, genetic testing, and to further clarify her future cancer risks, as well as potential cancer risks for family members.   In 2022, at the age of 81, Tina Patel was diagnosed with pancreatic cancer. The treatment plan currently includes chemotherapy.   CANCER HISTORY:  Oncology History Overview Note  Cancer Staging Pancreatic cancer [facility]) Staging form: Exocrine Pancreas, AJCC 8th Edition - Clinical stage from 09/26/2020: Stage IB (cT2, cN0, cM0) - Signed by [name], MD on 09/29/2020 Stage prefix: Initial diagnosis Total positive nodes: 0    Pancreatic cancer ([place])  09/18/2020 Imaging   US Abdomen  IMPRESSION: Intrahepatic and extrahepatic biliary ductal dilatation. Common bile duct dilated up to 13 mm. This could be further evaluated with MRCP or ERCP.   Sludge within the gallbladder with associated gallbladder wall thickening and pericholecystic fluid. Cannot exclude acute cholecystitis.   Multiple hepatic cysts. Complex cystic area posteriorly in the right hepatic lobe. This could also be further evaluated with MRI.   09/19/2020 Procedure   EUS by Dr hung  IMPRESSION - A mass was identified in the pancreatic head. The staging applies if malignancy is confirmed. Fine needle aspiration performed. - There was dilation in the common  hepatic duct which measured up to 13 mm. - There was dilation in the intrahepatic bile ducts, diffusely. - A cyst was found in the left lobe of the liver and measured 24 mm by 15 mm. - Endosonographic images of the left adrenal gland were unremarkable.    A. PANCREAS, HEAD, FINE NEEDLE ASPIRATION:    FINAL MICROSCOPIC DIAGNOSIS:  - Benign reactive/reparative changes  - Bland spindle cell fragments   SPECIMEN ADEQUACY:  Satisfactory for evaluation   IMMEDIATE EVALUATION:  SPINDLE FRAGMENTS AND BENIGN GLANDULAR FRAGMENTS. NO DEFINITIVE  MALIGNANCY. ([facility])   DIAGNOSTIC COMMENTS:  There are fragments of bland spindle cells, favored to represent smooth  muscle tissue. The glandular fragments are also bland and thus, there is  no definitive malignancy identified   B. BILIARY, STRICTURE, BRUSHING:    FINAL MICROSCOPIC DIAGNOSIS:  - Benign reactive/reparative changes   09/19/2020 Procedure   ERCP by Dr Benson [place]  IMPRESSION - The major papilla appeared normal. - A biliary sphincterotomy was performed. - Cells for cytology obtained distal CBD. - One covered metal stent was placed into the common bile duct.   09/20/2020 Imaging   MRI Abdomen  IMPRESSION: 1. 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 Recommend ERCP/EUS or tissue sampling. 2. There is mild biliary ductal dilatation of the common hepatic bile duct and the intrahepatic bile ducts in the LEFT hepatic lobe. Sequences are severely degraded by patient motion. Concern for obstruction of the common bile duct by the pancreatic head  mass. 3. Multiple cystic lesions throughout the liver parenchyma. Assessment of enhancement is difficult due to patient motion as well as no true noncontrast T1 weighted imaging as described above. Favor complex benign cysts but consider liver protocol contrast CT for further evaluation of the cystic  lesions to exclude metastasis.   09/21/2020 Imaging   CT CAP  IMPRESSION: 1. Heterogeneous mass of the central pancreatic head measuring 4.5 x 3.3 cm. There is atrophy of the distal pancreatic parenchyma with diffuse pancreatic ductal dilatation up to 0.8 cm. Mass appears to closely abut the lateral surface of the portal vein near the confluence. The superior mesenteric artery is well separated by a fat plane, as is the hepatic artery. 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 The exact borders and size of the mass are better delineated by prior MR. 2. No evidence of metastatic disease in the chest, abdomen, or pelvis. 3. There are numerous lobulated, fluid attenuating cysts throughout the hepatic parenchyma, numerous additional subcentimeter lesions are too small to characterize although demonstrate no evidence of contrast enhancement and are likely additional subcentimeter cysts. Attention on follow-up. 4. Status post common bile duct stent with post stenting pneumobilia. 5. Thickening and fat stranding about the gallbladder as well as about the adjacent duodenal bulb and descending duodenum, of uncertain etiology, concerning for cholecystitis, enteritis, and/or pancreatitis, possibly related to recent endoscopic procedure.   Aortic Atherosclerosis (ICD10-I70.0).   09/26/2020 Cancer Staging   Staging form: Exocrine Pancreas, AJCC 8th Edition - Clinical stage from 09/26/2020: Stage IIA (cT3, cN0, cM0) - Signed by [name], MD on 09/29/2020  Stage prefix: Initial diagnosis  Total positive nodes: 0    09/26/2020 Procedure   EUS by Dr Benson Norway  IMPRESSION - A mass was identified in the pancreatic head. Cytology results are pending. However, the endosonographic appearance is consistent with adenocarcinoma. Fine needle aspiration performed.   09/26/2020 Initial Biopsy   A. PANCREAS, HEAD, FINE NEEDLE ASPIRATION:    FINAL MICROSCOPIC DIAGNOSIS:  - Malignant cells  consistent with adenocarcinoma    09/29/2020 Initial Diagnosis   Pancreatic cancer ([place])    10/07/2020 -  Chemotherapy   First-line Gemcitabine and Abraxane 2 weeks on/1 week off starting 10/07/20.  C1 given with Gemcitabine alone.       RISK FACTORS:  Menarche was at age 72.  First live birth at age 57.  Ovaries intact: yes.  Hysterectomy: no.  Menopausal status: postmenopausal.  HRT use: 0 years. Number of breast biopsies: 1.  Past Medical History:  Diagnosis Date   Diabetes ([place])    Diabetes mellitus    Family history of colon cancer    Family history of lung cancer    Family history of multiple myeloma    Family history of prostate cancer    High cholesterol    Hypertension    Hypothyroid     Past Surgical History:  Procedure Laterality Date   BILIARY BRUSHING  09/19/2020   Procedure: BILIARY BRUSHING;  Surgeon: [name], MD;  Location: [facility];  Service: Endoscopy;;   BILIARY STENT PLACEMENT N/A 09/19/2020   Procedure: BILIARY STENT PLACEMENT;  Surgeon: [name], MD;  Location: [facility];  Service: Endoscopy;  Laterality: N/A;   ERCP N/A 09/19/2020   Procedure: ENDOSCOPIC RETROGRADE CHOLANGIOPANCREATOGRAPHY (ERCP);  Surgeon: [name], MD;  Location: [facility];  Service: Endoscopy;  Laterality: N/A;   ESOPHAGOGASTRODUODENOSCOPY (EGD) WITH PROPOFOL N/A 09/19/2020   Procedure: ESOPHAGOGASTRODUODENOSCOPY (EGD) WITH PROPOFOL;  Surgeon: [name], MD;  Location: [facility];  Service: Endoscopy;  Laterality: N/A;   ESOPHAGOGASTRODUODENOSCOPY (EGD) WITH PROPOFOL N/A 09/26/2020   Procedure: ESOPHAGOGASTRODUODENOSCOPY (EGD) WITH PROPOFOL;  Surgeon: [name], MD;  Location: [facility] END[facility];  Service: Endoscopy;  Laterality: N/A;   EUS N/A 09/19/2020   Procedure: UPPER ENDOSCOPIC ULTRASOUND (EUS) LINEAR;  Surgeon: [name], MD;  Location: [facility];  Service: Endoscopy;  Laterality: N/A;   FINE NEEDLE ASPIRATION  09/19/2020   Procedure: FINE NEEDLE  ASPIRATION;  Surgeon: [name], MD;  Location: [facility] END[facility];  Service: Endoscopy;;   FINE NEEDLE ASPIRATION N/A 09/26/2020   Procedure: FINE NEEDLE ASPIRATION (FNA) LINEAR;  Surgeon: [name], MD;  Location: [facility] END[facility];  Service: Endoscopy;  Laterality: N/A;   FINGER SURGERY     SPHINCTEROTOMY  09/19/2020   Procedure: SPHINCTEROTOMY;  Surgeon: [name], MD;  Location: [facility];  Service: Endoscopy;;   UPPER ESOPHAGEAL ENDOSCOPIC ULTRASOUND (EUS) N/A 09/26/2020   Procedure: UPPER ESOPHAGEAL ENDOSCOPIC ULTRASOUND (EUS);  Surgeon: [name], MD;  Location: [facility];  Service: Endoscopy;  Laterality: N/A;    Social History   Socioeconomic History   Marital status: Married    Spouse name: Not on file   Number of children: 3   Years of education: Not on file   Highest education level: Not on file  Occupational History   Not on file  Tobacco Use   Smoking status: Never   Smokeless tobacco: Not on file  Substance and Sexual Activity   Alcohol use: No   Drug use: No   Sexual activity: Not on file  Other Topics Concern   Not on file  Social History Narrative   Not on file   Social Determinants of Health   Financial Resource Strain: Not on file  Food Insecurity: Not on file  Transportation Needs: Not on file  Physical Activity: Not on file  Stress: Not on file  Social Connections: Not on file     FAMILY HISTORY:  We obtained a detailed, 4-generation family history.  Significant diagnoses are listed below: Family History  Problem Relation Age of Onset   Cancer Mother        lung cancer   Prostate cancer Father    Multiple myeloma Sister    Prostate cancer Brother    Cancer - Colon Maternal Aunt    Lung cancer Maternal Uncle    Cancer Son 100       prostate cancer   Tina Patel has 1 son and 2 daughters. Her son had prostate cancer at 91 and is living at 46. Tina Patel had 2 sisters and 2 brothers. One brother has prostate cancer. Her sister Kendrick Fries  died of myeloma.  Tina Patel mother died of lung cancer at 86 and had history of smoking. She had 5 maternal uncles, 3 maternal aunts. One aunt had colon cancer, one uncle had lung cancer, and another uncle had cancer, unknown type. No other known cancers on this side of the family, limited information.  Tina Patel father had prostate cancer and died at 41. No other know cancers on this side of the family, limited information.  Tina Patel is unaware of previous family history of genetic testing for hereditary cancer risks. Patient's maternal ancestors are of unknown descent, and paternal ancestors are of unknown descent. There is no reported Ashkenazi Jewish ancestry. There is no known consanguinity.    GENETIC COUNSELING ASSESSMENT: Tina Patel is a 80 y.o. female with a personal and family history of cancer which is somewhat  suggestive of a hereditary cancer syndrome and predisposition to cancer. We, therefore, discussed and recommended the following at today's visit.   DISCUSSION: We discussed that approximately 10% of pancreatic cancer is hereditary. Most cases of hereditary pancreatic cancer are associated with BRCA1/BRCA2 genes, although there are other genes associated with hereditary pancreatic cancer as well. The BRCA1/BRCA2 genes also increase risk for prostate cancer, which we see in her family. There are other genes associated with other cancers we can test. Cancers and risks are gene specific.  We discussed that testing is beneficial for several reasons including knowing if an individual is a candidate for certain targeted therapies, knowing about other cancer risks, identifying potential screening and risk-reduction options that may be appropriate, and to understand if other family members could be at risk for cancer and allow them to undergo genetic testing.   We reviewed the characteristics, features and inheritance patterns of hereditary cancer syndromes. We also discussed genetic  testing, including the appropriate family members to test, the process of testing, insurance coverage and turn-around-time for results. We discussed the implications of a negative, positive and/or variant of uncertain significant result. We recommended Tina Patel pursue genetic testing for the [facility] CancerNext-Expanded+RNA gene panel.   The CancerNext-Expanded + RNAinsight gene panel offered by [employer] and includes sequencing and rearrangement analysis for the following 77 genes: IP, ALK, APC*, ATM*, AXIN2, BAP1, BARD1, BLM, BMPR1A, BRCA1*, BRCA2*, BRIP1*, CDC73, CDH1*,CDK4, CDKN1B, CDKN2A, CHEK2*, CTNNA1, DICER1, FANCC, FH, FLCN, GALNT12, KIF1B, LZTR1, MAX, MEN1, MET, MLH1*, MSH2*, MSH3, MSH6*, MUTYH*, NBN, NF1*, NF2, NTHL1, PALB2*, PHOX2B, PMS2*, POT1, PRKAR1A, PTCH1, PTEN*, RAD51C*, RAD51D*,RB1, RECQL, RET, SDHA, SDHAF2, SDHB, SDHC, SDHD, SMAD4, SMARCA4, SMARCB1, SMARCE1, STK11, SUFU, TMEM127, TP53*,TSC1, TSC2, VHL and XRCC2 (sequencing and deletion/duplication); EGFR, EGLN1, HOXB13, KIT, MITF, PDGFRA, POLD1 and POLE (sequencing only); EPCAM and GREM1 (deletion/duplication only).  Based on Tina Patel's personal and family history of cancer, she meets medical criteria for genetic testing. Despite that she meets criteria, she may still have an out of pocket cost. We discussed that if her out of pocket cost for testing is over $100, the laboratory will call and confirm whether she wants to proceed with testing.  If the out of pocket cost of testing is less than $100 she will be billed by the genetic testing laboratory.   PLAN: After considering the risks, benefits, and limitations, Tina Patel provided informed consent to pursue genetic testing and the blood sample was sent to [facility] for analysis of the CancerNext-Expanded+RNA Panel. Results should be available within approximately 2-3 weeks' time, at which point they will be disclosed by telephone to Tina Patel, as will any additional  recommendations warranted by these results. Tina Patel will receive a summary of her genetic counseling visit and a copy of her results once available. This information will also be available in Epic.   Tina Patel questions were answered to her satisfaction today. Our contact information was provided should additional questions or concerns arise. Thank you for the referral and allowing [place] to share in the care of your patient.   Faith Rogue, MS, [facility] Genetic Counselor [email or web address] Phone: [phone]  The patient was seen for a total of 25 minutes in face-to-face genetic counseling. Patient brought her daughter, Tina Patel. Drs. Magrinat/Gudena/and/orFeng were available for discussion regarding this case.   _______________________________________________________________________ For Office Staff:  Number of people involved in session: 2 Was an Intern/ student involved with case: no

## 2020-10-14 ENCOUNTER — Ambulatory Visit: Payer: Medicare Other

## 2020-10-15 ENCOUNTER — Other Ambulatory Visit: Payer: Medicare Other

## 2020-10-15 ENCOUNTER — Ambulatory Visit: Payer: Medicare Other | Admitting: Hematology

## 2020-10-15 ENCOUNTER — Ambulatory Visit: Payer: Medicare Other

## 2020-10-16 ENCOUNTER — Ambulatory Visit: Payer: Medicare Other

## 2020-10-16 ENCOUNTER — Other Ambulatory Visit: Payer: Medicare Other

## 2020-10-16 ENCOUNTER — Ambulatory Visit: Payer: Medicare Other | Admitting: Hematology

## 2020-10-21 ENCOUNTER — Telehealth: Payer: Self-pay | Admitting: Licensed Clinical Social Worker

## 2020-10-21 ENCOUNTER — Other Ambulatory Visit: Payer: Self-pay | Admitting: Hematology

## 2020-10-21 DIAGNOSIS — C259 Malignant neoplasm of pancreas, unspecified: Secondary | ICD-10-CM | POA: Diagnosis not present

## 2020-10-22 ENCOUNTER — Ambulatory Visit: Payer: Self-pay | Admitting: Licensed Clinical Social Worker

## 2020-10-22 ENCOUNTER — Encounter: Payer: Self-pay | Admitting: Hematology

## 2020-10-22 ENCOUNTER — Encounter: Payer: Self-pay | Admitting: Licensed Clinical Social Worker

## 2020-10-22 DIAGNOSIS — Z800 Family history of malignant neoplasm of digestive organs: Secondary | ICD-10-CM

## 2020-10-22 DIAGNOSIS — Z1379 Encounter for other screening for genetic and chromosomal anomalies: Secondary | ICD-10-CM | POA: Insufficient documentation

## 2020-10-22 DIAGNOSIS — Z8042 Family history of malignant neoplasm of prostate: Secondary | ICD-10-CM

## 2020-10-22 DIAGNOSIS — Z801 Family history of malignant neoplasm of trachea, bronchus and lung: Secondary | ICD-10-CM

## 2020-10-22 DIAGNOSIS — Z807 Family history of other malignant neoplasms of lymphoid, hematopoietic and related tissues: Secondary | ICD-10-CM

## 2020-10-22 DIAGNOSIS — C250 Malignant neoplasm of head of pancreas: Secondary | ICD-10-CM

## 2020-10-22 NOTE — Telephone Encounter (Signed)
Revealed negative genetic testing. This normal result is reassuring and indicates that it is unlikely Tina Patel's cancer is due to a hereditary cause.  It is unlikely that there is an increased risk of another cancer due to a mutation in one of these genes.  However, genetic testing is not perfect, and cannot definitively rule out a hereditary cause.  It will be important for her to keep in contact with genetics to learn if any additional testing may be needed in the future.

## 2020-10-22 NOTE — Progress Notes (Signed)
HPI:  Tina Patel was previously seen in the [place] clinic due to a personal and family history of cancer and concerns regarding a hereditary predisposition to cancer. Please refer to our prior cancer genetics clinic note for more information regarding our discussion, assessment and recommendations, at the time. Tina Patel recent genetic test results were disclosed to her, as were recommendations warranted by these results. These results and recommendations are discussed in more detail below.  CANCER HISTORY:  Oncology History Overview Note  Cancer Staging Pancreatic cancer [facility]) Staging form: Exocrine Pancreas, AJCC 8th Edition - Clinical stage from 09/26/2020: Stage IB (cT2, cN0, cM0) - Signed by [name], MD on 09/29/2020 Stage prefix: Initial diagnosis Total positive nodes: 0    Pancreatic cancer ([place])  09/18/2020 Imaging   US Abdomen  IMPRESSION: Intrahepatic and extrahepatic biliary ductal dilatation. Common bile duct dilated up to 13 mm. This could be further evaluated with MRCP or ERCP.   Sludge within the gallbladder with associated gallbladder wall thickening and pericholecystic fluid. Cannot exclude acute cholecystitis.   Multiple hepatic cysts. Complex cystic area posteriorly in the right hepatic lobe. This could also be further evaluated with MRI.   09/19/2020 Procedure   EUS by Dr hung  IMPRESSION - A mass was identified in the pancreatic head. The staging applies if malignancy is confirmed. Fine needle aspiration performed. - There was dilation in the common hepatic duct which measured up to 13 mm. - There was dilation in the intrahepatic bile ducts, diffusely. - A cyst was found in the left lobe of the liver and measured 24 mm by 15 mm. - Endosonographic images of the left adrenal gland were unremarkable.    A. PANCREAS, HEAD, FINE NEEDLE ASPIRATION:    FINAL MICROSCOPIC DIAGNOSIS:  - Benign reactive/reparative changes  - Bland spindle cell  fragments   SPECIMEN ADEQUACY:  Satisfactory for evaluation   IMMEDIATE EVALUATION:  SPINDLE FRAGMENTS AND BENIGN GLANDULAR FRAGMENTS. NO DEFINITIVE  MALIGNANCY. ([facility])   DIAGNOSTIC COMMENTS:  There are fragments of bland spindle cells, favored to represent smooth  muscle tissue. The glandular fragments are also bland and thus, there is  no definitive malignancy identified   B. BILIARY, STRICTURE, BRUSHING:    FINAL MICROSCOPIC DIAGNOSIS:  - Benign reactive/reparative changes   09/19/2020 Procedure   ERCP by Dr Benson [place]  IMPRESSION - The major papilla appeared normal. - A biliary sphincterotomy was performed. - Cells for cytology obtained distal CBD. - One covered metal stent was placed into the common bile duct.   09/20/2020 Imaging   MRI Abdomen  IMPRESSION: 1. 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 Recommend ERCP/EUS or tissue sampling. 2. There is mild biliary ductal dilatation of the common hepatic bile duct and the intrahepatic bile ducts in the LEFT hepatic lobe. Sequences are severely degraded by patient motion. Concern for obstruction of the common bile duct by the pancreatic head mass. 3. Multiple cystic lesions throughout the liver parenchyma. Assessment of enhancement is difficult due to patient motion as well as no true noncontrast T1 weighted imaging as described above. Favor complex benign cysts but consider liver protocol contrast CT for further evaluation of the cystic lesions to exclude metastasis.   09/21/2020 Imaging   CT CAP  IMPRESSION: 1. Heterogeneous mass of the central pancreatic head measuring 4.5 x 3.3 cm. There is atrophy of the distal pancreatic parenchyma with diffuse pancreatic ductal dilatation up  to 0.8 cm. Mass appears to closely abut the lateral surface of the portal vein near the confluence. The superior mesenteric artery is well separated by  a fat plane, as is the hepatic artery. 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 The exact borders and size of the mass are better delineated by prior MR. 2. No evidence of metastatic disease in the chest, abdomen, or pelvis. 3. There are numerous lobulated, fluid attenuating cysts throughout the hepatic parenchyma, numerous additional subcentimeter lesions are too small to characterize although demonstrate no evidence of contrast enhancement and are likely additional subcentimeter cysts. Attention on follow-up. 4. Status post common bile duct stent with post stenting pneumobilia. 5. Thickening and fat stranding about the gallbladder as well as about the adjacent duodenal bulb and descending duodenum, of uncertain etiology, concerning for cholecystitis, enteritis, and/or pancreatitis, possibly related to recent endoscopic procedure.   Aortic Atherosclerosis (ICD10-I70.0).   09/26/2020 Cancer Staging   Staging form: Exocrine Pancreas, AJCC 8th Edition - Clinical stage from 09/26/2020: Stage IIA (cT3, cN0, cM0) - Signed by [name], MD on 09/29/2020  Stage prefix: Initial diagnosis  Total positive nodes: 0    09/26/2020 Procedure   EUS by Dr Benson [place]  IMPRESSION - A mass was identified in the pancreatic head. Cytology results are pending. However, the endosonographic appearance is consistent with adenocarcinoma. Fine needle aspiration performed.   09/26/2020 Initial Biopsy   A. PANCREAS, HEAD, FINE NEEDLE ASPIRATION:    FINAL MICROSCOPIC DIAGNOSIS:  - Malignant cells consistent with adenocarcinoma    09/29/2020 Initial Diagnosis   Pancreatic cancer ([place])    10/07/2020 -  Chemotherapy   First-line Gemcitabine and Abraxane 2 weeks on/1 week off starting 10/07/20.  C1 given with Gemcitabine alone.     Genetic Testing   Negative genetic testing. No pathogenic variants identified on the [facility] CancerNext-Expanded+RNA panel. The report date is 10/16/2020.  The  CancerNext-Expanded + RNAinsight gene panel offered by [employer] and includes sequencing and rearrangement analysis for the following 77 genes: IP, ALK, APC*, ATM*, AXIN2, BAP1, BARD1, BLM, BMPR1A, BRCA1*, BRCA2*, BRIP1*, CDC73, CDH1*,CDK4, CDKN1B, CDKN2A, CHEK2*, CTNNA1, DICER1, FANCC, FH, FLCN, GALNT12, KIF1B, LZTR1, MAX, MEN1, MET, MLH1*, MSH2*, MSH3, MSH6*, MUTYH*, NBN, NF1*, NF2, NTHL1, PALB2*, PHOX2B, PMS2*, POT1, PRKAR1A, PTCH1, PTEN*, RAD51C*, RAD51D*,RB1, RECQL, RET, SDHA, SDHAF2, SDHB, SDHC, SDHD, SMAD4, SMARCA4, SMARCB1, SMARCE1, STK11, SUFU, TMEM127, TP53*,TSC1, TSC2, VHL and XRCC2 (sequencing and deletion/duplication); EGFR, EGLN1, HOXB13, KIT, MITF, PDGFRA, POLD1 and POLE (sequencing only); EPCAM and GREM1 (deletion/duplication only).     FAMILY HISTORY:  We obtained a detailed, 4-generation family history.  Significant diagnoses are listed below: Family History  Problem Relation Age of Onset   Cancer Mother        lung cancer   Prostate cancer Father    Multiple myeloma Sister    Prostate cancer Brother    Cancer - Colon Maternal Aunt    Lung cancer Maternal Uncle    Cancer Son 47       prostate cancer   Tina Patel has 1 son and 2 daughters. Her son had prostate cancer at 35 and is living at 44. Tina Patel had 2 sisters and 2 brothers. One brother has prostate cancer. Her sister Kendrick Fries died of myeloma.   Tina Patel mother died of lung cancer at 88 and had history of smoking. She had 5 maternal uncles, 3 maternal aunts. One aunt had colon cancer, one uncle had lung cancer, and another uncle had cancer, unknown type. No other  known cancers on this side of the family, limited information.   Tina Patel father had prostate cancer and died at 75. No other know cancers on this side of the family, limited information.   Tina Patel is unaware of previous family history of genetic testing for hereditary cancer risks. Patient's maternal ancestors are of unknown descent, and  paternal ancestors are of unknown descent. There is no reported Ashkenazi Jewish ancestry. There is no known consanguinity.      GENETIC TEST RESULTS: Genetic testing reported out on 10/16/2020 through the [employer] CancerNext-Expanded+RNA cancer panel found no pathogenic mutations.   The CancerNext-Expanded + RNAinsight gene panel offered by [employer] and includes sequencing and rearrangement analysis for the following 77 genes: IP, ALK, APC*, ATM*, AXIN2, BAP1, BARD1, BLM, BMPR1A, BRCA1*, BRCA2*, BRIP1*, CDC73, CDH1*,CDK4, CDKN1B, CDKN2A, CHEK2*, CTNNA1, DICER1, FANCC, FH, FLCN, GALNT12, KIF1B, LZTR1, MAX, MEN1, MET, MLH1*, MSH2*, MSH3, MSH6*, MUTYH*, NBN, NF1*, NF2, NTHL1, PALB2*, PHOX2B, PMS2*, POT1, PRKAR1A, PTCH1, PTEN*, RAD51C*, RAD51D*,RB1, RECQL, RET, SDHA, SDHAF2, SDHB, SDHC, SDHD, SMAD4, SMARCA4, SMARCB1, SMARCE1, STK11, SUFU, TMEM127, TP53*,TSC1, TSC2, VHL and XRCC2 (sequencing and deletion/duplication); EGFR, EGLN1, HOXB13, KIT, MITF, PDGFRA, POLD1 and POLE (sequencing only); EPCAM and GREM1 (deletion/duplication only).   The test report has been scanned into EPIC and is located under the Molecular Pathology section of the Results Review tab.  A portion of the result report is included below for reference.     We discussed that because current genetic testing is not perfect, it is possible there may be a gene mutation in one of these genes that current testing cannot detect, but that chance is small.  There could be another gene that has not yet been discovered, or that we have not yet tested, that is responsible for the cancer diagnoses in the family. It is also possible there is a hereditary cause for the cancer in the family that Tina Patel did not inherit and therefore was not identified in her testing.  Therefore, it is important to remain in touch with cancer genetics in the future so that we can continue to offer Tina Patel the most up to date genetic testing.   ADDITIONAL GENETIC  TESTING: We discussed with Tina Patel that her genetic testing was fairly extensive.  If there are genes identified to increase cancer risk that can be analyzed in the future, we would be happy to discuss and coordinate this testing at that time.    CANCER SCREENING RECOMMENDATIONS: Tina Patel test result is considered negative (normal).  This means that we have not identified a hereditary cause for her personal and family history of cancer at this time. Most cancers happen by chance and this negative test suggests that her cancer may fall into this category.    While reassuring, this does not definitively rule out a hereditary predisposition to cancer. It is still possible that there could be genetic mutations that are undetectable by current technology. There could be genetic mutations in genes that have not been tested or identified to increase cancer risk.  Therefore, it is recommended she continue to follow the cancer management and screening guidelines provided by her oncology and primary healthcare provider.   An individual's cancer risk and medical management are not determined by genetic test results alone. Overall cancer risk assessment incorporates additional factors, including personal medical history, family history, and any available genetic information that may result in a personalized plan for cancer prevention and surveillance.   RECOMMENDATIONS FOR FAMILY MEMBERS:  Relatives in this family might be at some increased risk of developing cancer, over the general population risk, simply due to the family history of cancer.  We recommended female relatives in this family have a yearly mammogram beginning at age 22, or 35 years younger than the earliest onset of cancer, an annual clinical breast exam, and perform monthly breast self-exams. Female relatives in this family should also have a gynecological exam as recommended by their primary provider.  All family members should be referred for  colonoscopy starting at age 30.    It is also possible there is a hereditary cause for the cancer in Tina Patel's family that she did not inherit and therefore was not identified in her.  Based on Tina Patel's family history, we recommended her son/ those with prostate cancer have genetic counseling and testing. Tina Patel will let us know if we can be of any assistance in coordinating genetic counseling and/or testing for these family members.  FOLLOW-UP: Lastly, we discussed with Tina Patel that cancer genetics is a rapidly advancing field and it is possible that new genetic tests will be appropriate for her and/or her family members in the future. We encouraged her to remain in contact with cancer genetics on an annual basis so we can update her personal and family histories and let her know of advances in cancer genetics that may benefit this family.   Our contact number was provided. Tina Patel questions were answered to her satisfaction, and she knows she is welcome to call us at anytime with additional questions or concerns.   Faith Rogue, MS, [facility] [email or web address] Phone: [phone]

## 2020-10-24 ENCOUNTER — Other Ambulatory Visit: Payer: Self-pay

## 2020-10-24 ENCOUNTER — Inpatient Hospital Stay: Payer: Medicare Other | Attending: Hematology | Admitting: Hematology

## 2020-10-24 ENCOUNTER — Inpatient Hospital Stay: Payer: Medicare Other

## 2020-10-24 VITALS — BP 118/57 | HR 86 | Temp 98.4°F | Resp 16 | Ht 67.0 in | Wt 135.5 lb

## 2020-10-24 DIAGNOSIS — C250 Malignant neoplasm of head of pancreas: Secondary | ICD-10-CM

## 2020-10-24 DIAGNOSIS — Z5111 Encounter for antineoplastic chemotherapy: Secondary | ICD-10-CM | POA: Diagnosis not present

## 2020-10-24 LAB — CMP (CANCER CENTER ONLY)
ALT: 15 U/L (ref 0–44)
AST: 22 U/L (ref 15–41)
Albumin: 3.3 g/dL — ABNORMAL LOW (ref 3.5–5.0)
Alkaline Phosphatase: 125 U/L (ref 38–126)
Anion gap: 11 (ref 5–15)
BUN: 14 mg/dL (ref 8–23)
CO2: 27 mmol/L (ref 22–32)
Calcium: 9 mg/dL (ref 8.9–10.3)
Chloride: 103 mmol/L (ref 98–111)
Creatinine: 0.91 mg/dL (ref 0.44–1.00)
GFR, Estimated: >60 mL/min (ref >60)
Glucose, Bld: 187 mg/dL — ABNORMAL HIGH (ref 70–99)
Potassium: 4.6 mmol/L (ref 3.5–5.1)
Sodium: 141 mmol/L (ref 135–145)
Total Bilirubin: 0.8 mg/dL (ref 0.3–1.2)
Total Protein: 6.9 g/dL (ref 6.5–8.1)

## 2020-10-24 LAB — CBC WITH DIFFERENTIAL (CANCER CENTER ONLY)
Abs Immature Granulocytes: 0.01 10*3/uL (ref 0.00–0.07)
Basophils Absolute: 0 10*3/uL (ref 0.0–0.1)
Basophils Relative: 1 %
Eosinophils Absolute: 0.1 10*3/uL (ref 0.0–0.5)
Eosinophils Relative: 2 %
HCT: 33.4 % — ABNORMAL LOW (ref 36.0–46.0)
Hemoglobin: 10.9 g/dL — ABNORMAL LOW (ref 12.0–15.0)
Immature Granulocytes: 0 %
Lymphocytes Relative: 42 %
Lymphs Abs: 1.7 10*3/uL (ref 0.7–4.0)
MCH: 28.8 pg (ref 26.0–34.0)
MCHC: 32.6 g/dL (ref 30.0–36.0)
MCV: 88.1 fL (ref 80.0–100.0)
Monocytes Absolute: 0.3 10*3/uL (ref 0.1–1.0)
Monocytes Relative: 7 %
Neutro Abs: 1.9 10*3/uL (ref 1.7–7.7)
Neutrophils Relative %: 48 %
Platelet Count: 248 10*3/uL (ref 150–400)
RBC: 3.79 MIL/uL — ABNORMAL LOW (ref 3.87–5.11)
RDW: 13.8 % (ref 11.5–15.5)
WBC Count: 4 10*3/uL (ref 4.0–10.5)
nRBC: 0 % (ref 0.0–0.2)

## 2020-10-24 NOTE — Progress Notes (Addendum)
[place]   [phone] [phone]   Clinic Follow up Note   Patient Care Team: [name], DO as PCP - General (Family Medicine) [name], RN (Inactive) as Oncology Nurse Navigator [name], MD as Consulting Physician (Oncology) [name], MD as Consulting Physician (Gastroenterology)  Date of Service:  10/24/2020  CHIEF COMPLAINT: f/u of pancreatic cancer  SUMMARY OF ONCOLOGIC HISTORY: Oncology History Overview Note  Cancer Staging Pancreatic cancer [facility]) Staging form: Exocrine Pancreas, AJCC 8th Edition - Clinical stage from 09/26/2020: Stage IB (cT2, cN0, cM0) - Signed by [name], MD on 09/29/2020 Stage prefix: Initial diagnosis Total positive nodes: 0    Pancreatic cancer ([place])  09/18/2020 Imaging   US Abdomen  IMPRESSION: Intrahepatic and extrahepatic biliary ductal dilatation. Common bile duct dilated up to 13 mm. This could be further evaluated with MRCP or ERCP.   Sludge within the gallbladder with associated gallbladder wall thickening and pericholecystic fluid. Cannot exclude acute cholecystitis.   Multiple hepatic cysts. Complex cystic area posteriorly in the right hepatic lobe. This could also be further evaluated with MRI.   09/19/2020 Procedure   EUS by Dr hung  IMPRESSION - A mass was identified in the pancreatic head. The staging applies if malignancy is confirmed. Fine needle aspiration performed. - There was dilation in the common hepatic duct which measured up to 13 mm. - There was dilation in the intrahepatic bile ducts, diffusely. - A cyst was found in the left lobe of the liver and measured 24 mm by 15 mm. - Endosonographic images of the left adrenal gland were unremarkable.    A. PANCREAS, HEAD, FINE NEEDLE ASPIRATION:    FINAL MICROSCOPIC DIAGNOSIS:  - Benign reactive/reparative changes  - Bland spindle cell fragments   SPECIMEN ADEQUACY:  Satisfactory for evaluation   IMMEDIATE  EVALUATION:  SPINDLE FRAGMENTS AND BENIGN GLANDULAR FRAGMENTS. NO DEFINITIVE  MALIGNANCY. ([facility])   DIAGNOSTIC COMMENTS:  There are fragments of bland spindle cells, favored to represent smooth  muscle tissue. The glandular fragments are also bland and thus, there is  no definitive malignancy identified   B. BILIARY, STRICTURE, BRUSHING:    FINAL MICROSCOPIC DIAGNOSIS:  - Benign reactive/reparative changes   09/19/2020 Procedure   ERCP by Dr Benson Norway  IMPRESSION - The major papilla appeared normal. - A biliary sphincterotomy was performed. - Cells for cytology obtained distal CBD. - One covered metal stent was placed into the common bile duct.   09/20/2020 Imaging   MRI Abdomen  IMPRESSION: 1. 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 Recommend ERCP/EUS or tissue sampling. 2. There is mild biliary ductal dilatation of the common hepatic bile duct and the intrahepatic bile ducts in the LEFT hepatic lobe. Sequences are severely degraded by patient motion. Concern for obstruction of the common bile duct by the pancreatic head mass. 3. Multiple cystic lesions throughout the liver parenchyma. Assessment of enhancement is difficult due to patient motion as well as no true noncontrast T1 weighted imaging as described above. Favor complex benign cysts but consider liver protocol contrast CT for further evaluation of the cystic lesions to exclude metastasis.   09/21/2020 Imaging   CT CAP  IMPRESSION: 1. Heterogeneous mass of the central pancreatic head measuring 4.5 x 3.3 cm. There is atrophy of the distal pancreatic parenchyma with diffuse pancreatic ductal dilatation up to 0.8 cm. Mass appears to closely abut the lateral surface of  the portal vein near the confluence. The superior mesenteric artery is well separated by a fat plane, as is the hepatic artery. Findings are consistent  with pancreatic adenocarcinoma. The exact borders and size of the mass are better delineated by prior MR. 2. No evidence of metastatic disease in the chest, abdomen, or pelvis. 3. There are numerous lobulated, fluid attenuating cysts throughout the hepatic parenchyma, numerous additional subcentimeter lesions are too small to characterize although demonstrate no evidence of contrast enhancement and are likely additional subcentimeter cysts. Attention on follow-up. 4. Status post common bile duct stent with post stenting pneumobilia. 5. Thickening and fat stranding about the gallbladder as well as about the adjacent duodenal bulb and descending duodenum, of uncertain etiology, concerning for cholecystitis, enteritis, and/or pancreatitis, possibly related to recent endoscopic procedure.   Aortic Atherosclerosis (ICD10-I70.0).   09/26/2020 Cancer Staging   Staging form: Exocrine Pancreas, AJCC 8th Edition - Clinical stage from 09/26/2020: Stage IIA (cT3, cN0, cM0) - Signed by [name], MD on 09/29/2020  Stage prefix: Initial diagnosis  Total positive nodes: 0    09/26/2020 Procedure   EUS by Dr Benson Norway  IMPRESSION - A mass was identified in the pancreatic head. Cytology results are pending. However, the endosonographic appearance is consistent with adenocarcinoma. Fine needle aspiration performed.   09/26/2020 Initial Biopsy   A. PANCREAS, HEAD, FINE NEEDLE ASPIRATION:    FINAL MICROSCOPIC DIAGNOSIS:  - Malignant cells consistent with adenocarcinoma    09/29/2020 Initial Diagnosis   Pancreatic cancer ([place])    10/07/2020 -  Chemotherapy   First-line Gemcitabine and Abraxane 2 weeks on/1 week off starting 10/07/20.  C1 given with Gemcitabine alone.     Genetic Testing   Negative genetic testing. No pathogenic variants identified on the [facility] CancerNext-Expanded+RNA panel. The report date is 10/16/2020.  The CancerNext-Expanded + RNAinsight gene panel offered by [employer] and  includes sequencing and rearrangement analysis for the following 77 genes: IP, ALK, APC*, ATM*, AXIN2, BAP1, BARD1, BLM, BMPR1A, BRCA1*, BRCA2*, BRIP1*, CDC73, CDH1*,CDK4, CDKN1B, CDKN2A, CHEK2*, CTNNA1, DICER1, FANCC, FH, FLCN, GALNT12, KIF1B, LZTR1, MAX, MEN1, MET, MLH1*, MSH2*, MSH3, MSH6*, MUTYH*, NBN, NF1*, NF2, NTHL1, PALB2*, PHOX2B, PMS2*, POT1, PRKAR1A, PTCH1, PTEN*, RAD51C*, RAD51D*,RB1, RECQL, RET, SDHA, SDHAF2, SDHB, SDHC, SDHD, SMAD4, SMARCA4, SMARCB1, SMARCE1, STK11, SUFU, TMEM127, TP53*,TSC1, TSC2, VHL and XRCC2 (sequencing and deletion/duplication); EGFR, EGLN1, HOXB13, KIT, MITF, PDGFRA, POLD1 and POLE (sequencing only); EPCAM and GREM1 (deletion/duplication only).      CURRENT THERAPY:  First-line Gemcitabine and Abraxane 2 weeks on/1 week off starting 10/07/20. C1 given with Gemcitabine alone.   INTERVAL HISTORY:  Tina Patel is here for a follow up of pancreatic cancer. She was last seen by me on 10/13/20. She presents to the clinic accompanied by her husband. She reports itching to her entire torso. She notes there was a rash as well, but it has resolved. She notes the itching worsened following the second dose. She has benadryl, which does relieve the itching for a while. She denies nausea, fatigue, decreased appetite, and any other side effects from treatment. Her husband notes she seems to be eating better since starting treatment.   All other systems were reviewed with the patient and are negative.  MEDICAL HISTORY:  Past Medical History:  Diagnosis Date   Diabetes ([place])    Diabetes mellitus    Family history of colon cancer    Family history of lung cancer    Family history of multiple myeloma    Family history  of prostate cancer    High cholesterol    Hypertension    Hypothyroid     SURGICAL HISTORY: Past Surgical History:  Procedure Laterality Date   BILIARY BRUSHING  09/19/2020   Procedure: BILIARY BRUSHING;  Surgeon: [name], MD;  Location: [facility]  ENDOSCOPY;  Service: Endoscopy;;   BILIARY STENT PLACEMENT N/A 09/19/2020   Procedure: BILIARY STENT PLACEMENT;  Surgeon: [name], MD;  Location: [facility] ENDOSCOPY;  Service: Endoscopy;  Laterality: N/A;   ERCP N/A 09/19/2020   Procedure: ENDOSCOPIC RETROGRADE CHOLANGIOPANCREATOGRAPHY (ERCP);  Surgeon: [name], MD;  Location: [facility]OS[facility];  Service: Endoscopy;  Laterality: N/A;   ESOPHAGOGASTRODUODENOSCOPY (EGD) WITH PROPOFOL N/A 09/19/2020   Procedure: ESOPHAGOGASTRODUODENOSCOPY (EGD) WITH PROPOFOL;  Surgeon: [name], MD;  Location: [facility] ENDOSCOPY;  Service: Endoscopy;  Laterality: N/A;   ESOPHAGOGASTRODUODENOSCOPY (EGD) WITH PROPOFOL N/A 09/26/2020   Procedure: ESOPHAGOGASTRODUODENOSCOPY (EGD) WITH PROPOFOL;  Surgeon: [name], MD;  Location: [facility] ENDOSCOPY;  Service: Endoscopy;  Laterality: N/A;   EUS N/A 09/19/2020   Procedure: UPPER ENDOSCOPIC ULTRASOUND (EUS) LINEAR;  Surgeon: [name], MD;  Location: [facility] ENDOSCOPY;  Service: Endoscopy;  Laterality: N/A;   FINE NEEDLE ASPIRATION  09/19/2020   Procedure: FINE NEEDLE ASPIRATION;  Surgeon: [name], MD;  Location: [facility] ENDOSCOPY;  Service: Endoscopy;;   FINE NEEDLE ASPIRATION N/A 09/26/2020   Procedure: FINE NEEDLE ASPIRATION (FNA) LINEAR;  Surgeon: [name], MD;  Location: [facility] ENDOSCOPY;  Service: Endoscopy;  Laterality: N/A;   FINGER SURGERY     SPHINCTEROTOMY  09/19/2020   Procedure: SPHINCTEROTOMY;  Surgeon: [name], MD;  Location: [facility] ENDOSCOPY;  Service: Endoscopy;;   UPPER ESOPHAGEAL ENDOSCOPIC ULTRASOUND (EUS) N/A 09/26/2020   Procedure: UPPER ESOPHAGEAL ENDOSCOPIC ULTRASOUND (EUS);  Surgeon: [name], MD;  Location: [facility];  Service: Endoscopy;  Laterality: N/A;    I have reviewed the social history and family history with the patient and they are unchanged from previous note.  ALLERGIES:  has No Known Allergies.  MEDICATIONS:  Current Outpatient Medications  Medication Sig Dispense Refill    amLODipine (NORVASC) 5 MG tablet Take 5 mg by mouth daily.     blood glucose meter kit and supplies KIT Dispense based on patient and insurance preference. Use up to four times daily as directed. 1 each 0   calcium carbonate (TUMS - DOSED IN MG ELEMENTAL CALCIUM) 500 MG chewable tablet Chew 500 mg by mouth daily as needed for indigestion or heartburn.     famotidine (PEPCID) 20 MG tablet Take 20 mg by mouth at bedtime.     furosemide (LASIX) 20 MG tablet Take 20 mg by mouth daily.     insulin glargine (LANTUS) 100 UNIT/ML Solostar Pen Inject 8 Units into the skin daily. 15 mL 0   levothyroxine (SYNTHROID) 75 MCG tablet Take 75 mcg by mouth daily before breakfast.     lisinopril (ZESTRIL) 10 MG tablet Take 10 mg by mouth daily.     metFORMIN (GLUCOPHAGE-XR) 500 MG 24 hr tablet Take 500 mg by mouth 2 (two) times daily.     mirtazapine (REMERON) 7.5 MG tablet TAKE 1 TABLET BY MOUTH AT BEDTIME. 90 tablet 1   ondansetron (ZOFRAN) 8 MG tablet Take 1 tablet (8 mg total) by mouth 2 (two) times daily as needed (Nausea or vomiting). 30 tablet 1   prochlorperazine (COMPAZINE) 10 MG tablet Take 1 tablet (10 mg total) by mouth every 6 (six) hours as needed (Nausea or vomiting). 30 tablet 1   traMADol (ULTRAM) 50  MG tablet Take 50 mg by mouth at bedtime.     Vitamin D, Ergocalciferol, 50 MCG (2000 UT) CAPS Take 2,000 Units by mouth daily.     No current facility-administered medications for this visit.    PHYSICAL EXAMINATION: ECOG PERFORMANCE STATUS: 1 - Symptomatic but completely ambulatory  Vitals:   10/24/20 1522  BP: (!) 118/57  Pulse: 86  Resp: 16  Temp: 98.4 F (36.9 C)  SpO2: 100%   Filed Weights   10/24/20 1522  Weight: 135 lb 8 oz (61.5 kg)    Due to COVID19 we will limit examination to appearance. Patient had no complaints.  GENERAL:alert, no distress and comfortable SKIN: skin color normal, no rashes or significant lesions EYES: normal, Conjunctiva are pink and non-injected,  sclera clear  NEURO: alert & oriented x 3 with fluent speech  LABORATORY DATA:  I have reviewed the data as listed CBC Latest Ref Rng & Units 10/24/2020 10/13/2020 10/06/2020  WBC 4.0 - 10.5 K/uL 4.0 2.8(L) 4.9  Hemoglobin 12.0 - 15.0 g/dL 10.9(L) 11.2(L) 12.2  Hematocrit 36.0 - 46.0 % 33.4(L) 33.4(L) 38.1  Platelets 150 - 400 K/uL 248 230 265     CMP Latest Ref Rng & Units 10/24/2020 10/13/2020 10/06/2020  Glucose 70 - 99 mg/dL 187(H) 98 85  BUN 8 - 23 mg/dL 14 14 19   Creatinine 0.44 - 1.00 mg/dL 0.91 0.82 0.77  Sodium 135 - 145 mmol/L 141 141 139  Potassium 3.5 - 5.1 mmol/L 4.6 4.6 3.5  Chloride 98 - 111 mmol/L 103 103 103  CO2 22 - 32 mmol/L 27 26 28   Calcium 8.9 - 10.3 mg/dL 9.0 9.3 9.1  Total Protein 6.5 - 8.1 g/dL 6.9 6.9 7.5  Total Bilirubin 0.3 - 1.2 mg/dL 0.8 1.5(H) 2.4(H)  Alkaline Phos 38 - 126 U/L 125 155(H) 200(H)  AST 15 - 41 U/L 22 26 30   ALT 0 - 44 U/L 15 19 25       RADIOGRAPHIC STUDIES: I have personally reviewed the radiological images as listed and agreed with the findings in the report. No results found.   ASSESSMENT & PLAN:  Tina Patel is a 80 y.o. female with   1. 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 Her initial EUS on 09/19/20 showed mass at head of pancreas, but biopsy was negative. She had ERCP stent placement same day. -Her repeat EUS biopsy of the mass on 09/26/20 showed adenocarcinoma from pancreatic primary. Her CT CAP showed mass to be 4.5cm. No evidence of metastatic disease in the chest, abdomen, or pelvis. She does have multiple benign cystic lesions in the liver.  -Per GI tumor board discussion, neoadjuvant chemotherapy was recommended, and referral to a tertiary cancer center for Whipple surgery evaluation.  She established care with Dr. Mariah Milling at [facility] on 10/21/20. -I started her on Gemcitabine 2 weeks on/1 week off starting 10/07/20.   -She tolerated first cycle of Gemcitabine well with only itching and mild rash to torso. -We again discussed adding abraxane to her treatment. They are willing to try this. I will change her treatment to every other week and increase her benadryl as pre-meds to help with the itching.  -labs reviewed, adequate to proceed with C2 Gemcitabine with abraxane on Monday, 10/27/20, and continue every 2 weeks    2. Obstructive Jaundice, Hyperbilirubinemia, Secondary to #1 -She was seen jaundice and elevated Tbili in early June 2022 hospitalization. -  She also had RUQ pain recently. This is controlled on Tramadol. -Symptoms much improved after ERCP Stent placement on 09/19/20 with Dr Benson Norway.  -Resolved, Tbili WNL today (10/24/20)   3. Weight loss, Fatigue, Secondary to #1 -For the past 3-6 months she has had 25 pounds weight loss and more fatigued. She is fine with her weight loss, but willing to gain back to 140 pounds. -She has low appetite, nausea and mild early satiety. She forces herself to remain active at home. -I recommend Glucerna for nutritional supplement and appetite stimulant Mirtazapine. She is agreeable. I recommend smaller 3-5 meals a day. Continue to f/u with dietician.    4. 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 Genetic Testing -Given her pancreatic cancer she is eligible for genetic testing.  -She was seen by Genetics on 10/13/20. Results are pending.    6. Social Support -She has great social support from her husband 80 yo) whom she lives with and her 3 children who live in town.      PLAN:  -proceed with C2 gemcitabine and add low dose abraxane on 10/27/20. Will incresae benadryl dose and add dexa due to her skin itchiness after chemo. We will change this to every other week. -Labs, f/u, and C3 gemcitabine and abraxane on 11/10/20    No problem-specific Assessment & Plan notes found for this encounter.   No orders of the defined types were placed in this  encounter.  All questions were answered. The patient knows to call the clinic with any problems, questions or concerns. No barriers to learning was detected. The total time spent in the appointment was 40 minutes.     [name], MD 10/24/2020  I, Wilburn Mylar, am acting as scribe for [name], MD.   I have reviewed the above documentation for accuracy and completeness, and I agree with the above.

## 2020-10-26 ENCOUNTER — Encounter: Payer: Self-pay | Admitting: Hematology

## 2020-10-27 ENCOUNTER — Inpatient Hospital Stay: Payer: Medicare Other | Admitting: Nutrition

## 2020-10-27 ENCOUNTER — Inpatient Hospital Stay: Payer: Medicare Other

## 2020-10-27 ENCOUNTER — Other Ambulatory Visit: Payer: Self-pay

## 2020-10-27 VITALS — BP 126/65 | HR 73 | Temp 98.0°F | Resp 18

## 2020-10-27 DIAGNOSIS — Z5111 Encounter for antineoplastic chemotherapy: Secondary | ICD-10-CM | POA: Diagnosis not present

## 2020-10-27 DIAGNOSIS — C250 Malignant neoplasm of head of pancreas: Secondary | ICD-10-CM

## 2020-10-27 MED ORDER — FAMOTIDINE 20 MG IN NS 100 ML IVPB
20.0000 mg | Freq: Once | INTRAVENOUS | Status: AC
  Administered 2020-10-27: 20 mg via INTRAVENOUS

## 2020-10-27 MED ORDER — PACLITAXEL PROTEIN-BOUND CHEMO INJECTION 100 MG
80.0000 mg/m2 | Freq: Once | INTRAVENOUS | Status: AC
Start: 2020-10-27 — End: 2020-10-27
  Administered 2020-10-27: 150 mg via INTRAVENOUS
  Filled 2020-10-27: qty 30

## 2020-10-27 MED ORDER — PROCHLORPERAZINE MALEATE 10 MG PO TABS
10.0000 mg | ORAL_TABLET | Freq: Once | ORAL | Status: AC
  Administered 2020-10-27: 10 mg via ORAL

## 2020-10-27 MED ORDER — PROCHLORPERAZINE MALEATE 10 MG PO TABS
ORAL_TABLET | ORAL | Status: AC
  Filled 2020-10-27: qty 1

## 2020-10-27 MED ORDER — SODIUM CHLORIDE 0.9 % IV SOLN
10.0000 mg | Freq: Once | INTRAVENOUS | Status: AC
  Administered 2020-10-27: 10 mg via INTRAVENOUS
  Filled 2020-10-27: qty 10

## 2020-10-27 MED ORDER — DIPHENHYDRAMINE HCL 25 MG PO CAPS
ORAL_CAPSULE | ORAL | Status: AC
  Filled 2020-10-27: qty 2

## 2020-10-27 MED ORDER — SODIUM CHLORIDE 0.9 % IV SOLN
Freq: Once | INTRAVENOUS | Status: AC
  Filled 2020-10-27: qty 250

## 2020-10-27 MED ORDER — DIPHENHYDRAMINE HCL 25 MG PO CAPS
50.0000 mg | ORAL_CAPSULE | Freq: Once | ORAL | Status: AC
Start: 2020-10-27 — End: 2020-10-27
  Administered 2020-10-27: 50 mg via ORAL

## 2020-10-27 MED ORDER — SODIUM CHLORIDE 0.9 % IV SOLN
800.0000 mg/m2 | Freq: Once | INTRAVENOUS | Status: AC
  Administered 2020-10-27: 1368 mg via INTRAVENOUS
  Filled 2020-10-27: qty 35.98

## 2020-10-27 MED ORDER — FAMOTIDINE 20 MG IN NS 100 ML IVPB
INTRAVENOUS | Status: AC
  Filled 2020-10-27: qty 100

## 2020-10-27 NOTE — Progress Notes (Signed)
Nutrition follow up completed with patient in infusion.  Patient is receiving chemotherapy for pancreas cancer.  She reports she has been trying to eat more and feels good. She denies nutrition impact symptoms. Weight documented as 135.5 pounds on July 8, decreased slightly from 137.9 on June 13.  Labs reviewed and noted glucose 187 and albumin 3.3.  I also spoke with daughter Butch Penny who requested to be included in nutrition follow-up.  Daughter reports her mother is very hard of hearing, almost deaf, but does read lips.  She confirms patient is trying to eat more and that they are assisting her with meals.  Patient is drinking 1 Ensure and 1 Glucerna daily.  Nutrition diagnosis: Food and nutrition related knowledge deficit continues.  Intervention: Educated patient and daughter to change oral nutrition supplements to ensure complete and drink twice daily between meals.  This does provide additional calories and protein above and beyond Glucerna.  Main focus should be on calories and protein and not g of sugar. I encouraged patient and daughter to contact me anytime they have any further questions or concerns.  Monitoring, evaluation, goals: Patient will tolerate adequate calories and protein to minimize weight loss.  Next visit: To be scheduled as needed.  **Disclaimer: This note was dictated with voice recognition software. Similar sounding words can inadvertently be transcribed and this note may contain transcription errors which may not have been corrected upon publication of note.**

## 2020-10-27 NOTE — Patient Instructions (Signed)
[facility]  Discharge Instructions: Thank you for choosing [facility] to provide your oncology and hematology care.   If you have a lab appointment with the [facility], please go directly to the [facility] and check in at the registration area.   Wear comfortable clothing and clothing appropriate for easy access to any Portacath or PICC line.   We strive to give you quality time with your provider. You may need to reschedule your appointment if you arrive late (15 or more minutes).  Arriving late affects you and other patients whose appointments are after yours.  Also, if you miss three or more appointments without notifying the office, you may be dismissed from the clinic at the provider's discretion.      For prescription refill requests, have your pharmacy contact our office and allow 72 hours for refills to be completed.    Today you received the following chemotherapy and/or immunotherapy agents : Abraxane, Gemzar     To help prevent nausea and vomiting after your treatment, we encourage you to take your nausea medication as directed.  BELOW ARE SYMPTOMS THAT SHOULD BE REPORTED IMMEDIATELY: *FEVER GREATER THAN 100.4 F (38 C) OR HIGHER *CHILLS OR SWEATING *NAUSEA AND VOMITING THAT IS NOT CONTROLLED WITH YOUR NAUSEA MEDICATION *UNUSUAL SHORTNESS OF BREATH *UNUSUAL BRUISING OR BLEEDING *URINARY PROBLEMS (pain or burning when urinating, or frequent urination) *BOWEL PROBLEMS (unusual diarrhea, constipation, pain near the anus) TENDERNESS IN MOUTH AND THROAT WITH OR WITHOUT PRESENCE OF ULCERS (sore throat, sores in mouth, or a toothache) UNUSUAL RASH, SWELLING OR PAIN  UNUSUAL VAGINAL DISCHARGE OR ITCHING   Items with * indicate a potential emergency and should be followed up as soon as possible or go to the Emergency Department if any problems should occur.  Please show the CHEMOTHERAPY ALERT CARD or IMMUNOTHERAPY ALERT CARD at  check-in to the Emergency Department and triage nurse.  Should you have questions after your visit or need to cancel or reschedule your appointment, please contact [facility]  Dept: [phone]  and follow the prompts.  Office hours are 8:00 a.m. to 4:30 p.m. Monday - Friday. Please note that voicemails left after 4:00 p.m. may not be returned until the following business day.  We are closed weekends and major holidays. You have access to a nurse at all times for urgent questions. Please call the main number to the clinic Dept: [phone] and follow the prompts.   For any non-urgent questions, you may also contact your provider using MyChart. We now offer e-Visits for anyone 35 and older to request care online for non-urgent symptoms. For details visit [email or web address].   Also download the MyChart app! Go to the app store, search "MyChart", open the app, select [facility], and log in with your MyChart username and password.  Due to Covid, a mask is required upon entering the hospital/clinic. If you do not have a mask, one will be given to you upon arrival. For doctor visits, patients may have 1 support person aged 64 or older with them. For treatment visits, patients cannot have anyone with them due to current Covid guidelines and our immunocompromised population.

## 2020-10-28 ENCOUNTER — Telehealth: Payer: Self-pay | Admitting: Hematology

## 2020-10-28 NOTE — Telephone Encounter (Signed)
Scheduled follow-up appointment per 7/8 los. Patient's daughter is aware.

## 2020-11-03 ENCOUNTER — Ambulatory Visit: Payer: Medicare Other | Admitting: Nurse Practitioner

## 2020-11-03 ENCOUNTER — Other Ambulatory Visit: Payer: Medicare Other

## 2020-11-03 ENCOUNTER — Ambulatory Visit: Payer: Medicare Other

## 2020-11-10 ENCOUNTER — Inpatient Hospital Stay: Payer: Medicare Other

## 2020-11-10 ENCOUNTER — Inpatient Hospital Stay: Payer: Medicare Other | Admitting: Hematology

## 2020-11-10 ENCOUNTER — Encounter: Payer: Self-pay | Admitting: Hematology

## 2020-11-10 ENCOUNTER — Other Ambulatory Visit: Payer: Self-pay

## 2020-11-10 VITALS — BP 132/70 | HR 76 | Temp 98.4°F | Resp 18 | Wt 141.1 lb

## 2020-11-10 DIAGNOSIS — C250 Malignant neoplasm of head of pancreas: Secondary | ICD-10-CM | POA: Diagnosis not present

## 2020-11-10 DIAGNOSIS — Z5111 Encounter for antineoplastic chemotherapy: Secondary | ICD-10-CM | POA: Diagnosis not present

## 2020-11-10 DIAGNOSIS — I878 Other specified disorders of veins: Secondary | ICD-10-CM

## 2020-11-10 LAB — CBC WITH DIFFERENTIAL (CANCER CENTER ONLY)
Abs Immature Granulocytes: 0.02 10*3/uL (ref 0.00–0.07)
Basophils Absolute: 0.1 10*3/uL (ref 0.0–0.1)
Basophils Relative: 1 %
Eosinophils Absolute: 0.2 10*3/uL (ref 0.0–0.5)
Eosinophils Relative: 5 %
HCT: 35 % — ABNORMAL LOW (ref 36.0–46.0)
Hemoglobin: 11.7 g/dL — ABNORMAL LOW (ref 12.0–15.0)
Immature Granulocytes: 0 %
Lymphocytes Relative: 36 %
Lymphs Abs: 1.8 10*3/uL (ref 0.7–4.0)
MCH: 29.2 pg (ref 26.0–34.0)
MCHC: 33.4 g/dL (ref 30.0–36.0)
MCV: 87.3 fL (ref 80.0–100.0)
Monocytes Absolute: 0.5 10*3/uL (ref 0.1–1.0)
Monocytes Relative: 9 %
Neutro Abs: 2.4 10*3/uL (ref 1.7–7.7)
Neutrophils Relative %: 49 %
Platelet Count: 200 10*3/uL (ref 150–400)
RBC: 4.01 MIL/uL (ref 3.87–5.11)
RDW: 14.6 % (ref 11.5–15.5)
WBC Count: 4.9 10*3/uL (ref 4.0–10.5)
nRBC: 0 % (ref 0.0–0.2)

## 2020-11-10 LAB — CMP (CANCER CENTER ONLY)
ALT: 17 U/L (ref 0–44)
AST: 23 U/L (ref 15–41)
Albumin: 3.4 g/dL — ABNORMAL LOW (ref 3.5–5.0)
Alkaline Phosphatase: 90 U/L (ref 38–126)
Anion gap: 10 (ref 5–15)
BUN: 20 mg/dL (ref 8–23)
CO2: 25 mmol/L (ref 22–32)
Calcium: 9.1 mg/dL (ref 8.9–10.3)
Chloride: 106 mmol/L (ref 98–111)
Creatinine: 0.8 mg/dL (ref 0.44–1.00)
GFR, Estimated: >60 mL/min (ref >60)
Glucose, Bld: 158 mg/dL — ABNORMAL HIGH (ref 70–99)
Potassium: 4.2 mmol/L (ref 3.5–5.1)
Sodium: 141 mmol/L (ref 135–145)
Total Bilirubin: 0.5 mg/dL (ref 0.3–1.2)
Total Protein: 6.9 g/dL (ref 6.5–8.1)

## 2020-11-10 MED ORDER — SODIUM CHLORIDE 0.9 % IV SOLN: 100.0000 mg/m2 | Freq: Once | INTRAVENOUS | Status: DC

## 2020-11-10 MED ORDER — PROCHLORPERAZINE MALEATE 10 MG PO TABS
10.0000 mg | ORAL_TABLET | Freq: Once | ORAL | Status: AC
  Administered 2020-11-10: 10 mg via ORAL

## 2020-11-10 MED ORDER — FAMOTIDINE 20 MG IN NS 100 ML IVPB
INTRAVENOUS | Status: AC
  Filled 2020-11-10: qty 100

## 2020-11-10 MED ORDER — PACLITAXEL PROTEIN-BOUND CHEMO INJECTION 100 MG
100.0000 mg/m2 | Freq: Once | INTRAVENOUS | Status: AC
Start: 2020-11-10 — End: 2020-11-10
  Administered 2020-11-10: 175 mg via INTRAVENOUS
  Filled 2020-11-10: qty 35

## 2020-11-10 MED ORDER — SODIUM CHLORIDE 0.9 % IV SOLN
1000.0000 mg/m2 | Freq: Once | INTRAVENOUS | Status: AC
  Administered 2020-11-10: 1710 mg via INTRAVENOUS
  Filled 2020-11-10: qty 44.97

## 2020-11-10 MED ORDER — DIPHENHYDRAMINE HCL 25 MG PO CAPS
50.0000 mg | ORAL_CAPSULE | Freq: Once | ORAL | Status: AC
  Administered 2020-11-10: 50 mg via ORAL

## 2020-11-10 MED ORDER — SODIUM CHLORIDE 0.9 % IV SOLN
10.0000 mg | Freq: Once | INTRAVENOUS | Status: AC
  Administered 2020-11-10: 10 mg via INTRAVENOUS
  Filled 2020-11-10: qty 10

## 2020-11-10 MED ORDER — DIPHENHYDRAMINE HCL 25 MG PO CAPS
ORAL_CAPSULE | ORAL | Status: AC
  Filled 2020-11-10: qty 2

## 2020-11-10 MED ORDER — SODIUM CHLORIDE 0.9 % IV SOLN
Freq: Once | INTRAVENOUS | Status: AC
  Filled 2020-11-10: qty 250

## 2020-11-10 MED ORDER — FAMOTIDINE 20 MG IN NS 100 ML IVPB
20.0000 mg | Freq: Once | INTRAVENOUS | Status: AC
  Administered 2020-11-10: 20 mg via INTRAVENOUS

## 2020-11-10 MED ORDER — PROCHLORPERAZINE MALEATE 10 MG PO TABS
ORAL_TABLET | ORAL | Status: AC
  Filled 2020-11-10: qty 1

## 2020-11-10 MED ORDER — SODIUM CHLORIDE 0.9 % IV SOLN
Freq: Once | INTRAVENOUS | Status: DC
  Filled 2020-11-10: qty 250

## 2020-11-10 NOTE — Progress Notes (Signed)
Spoke with MD Burr Medico, Gemzar dose = 1000 mg/m2

## 2020-11-10 NOTE — Progress Notes (Signed)
Tina Patel   Telephone:(336) [phone] Fax:(336) [phone]   Clinic Follow up Note   Patient Care Team: [name], DO as PCP - General (Family Medicine) [name], RN (Inactive) as Oncology Nurse Navigator [name], MD as Consulting Physician (Oncology) [name], MD as Consulting Physician (Gastroenterology)  Date of Service:  11/10/2020  CHIEF COMPLAINT: f/u of pancreatic cancer  SUMMARY OF ONCOLOGIC HISTORY: Oncology History Overview Note  Cancer Staging Pancreatic cancer [facility]) Staging form: Exocrine Pancreas, AJCC 8th Edition - Clinical stage from 09/26/2020: Stage IB (cT2, cN0, cM0) - Signed by [name], MD on 09/29/2020 Stage prefix: Initial diagnosis Total positive nodes: 0    Pancreatic cancer ([place])  09/18/2020 Imaging   US Abdomen  IMPRESSION: Intrahepatic and extrahepatic biliary ductal dilatation. Common bile duct dilated up to 13 mm. This could be further evaluated with MRCP or ERCP.   Sludge within the gallbladder with associated gallbladder wall thickening and pericholecystic fluid. Cannot exclude acute cholecystitis.   Multiple hepatic cysts. Complex cystic area posteriorly in the right hepatic lobe. This could also be further evaluated with MRI.   09/19/2020 Procedure   EUS and ERCP by Dr Benson Norway   EUS IMPRESSION - A mass was identified in the pancreatic head. The staging applies if malignancy is confirmed. Fine needle aspiration performed. - There was dilation in the common hepatic duct which measured up to 13 mm. - There was dilation in the intrahepatic bile ducts, diffusely. - A cyst was found in the left lobe of the liver and measured 24 mm by 15 mm. - Endosonographic images of the left adrenal gland were unremarkable.  ERCP IMPRESSION - The major papilla appeared normal. - A biliary sphincterotomy was performed. - Cells for cytology obtained distal CBD. - One covered metal stent was placed into the common bile  duct.     09/19/2020 Pathology Results   A. PANCREAS, HEAD, FINE NEEDLE ASPIRATION:  - Benign reactive/reparative changes  - Bland spindle cell fragments   DIAGNOSTIC COMMENTS:  There are fragments of bland spindle cells, favored to represent smooth muscle tissue. The glandular fragments are also bland and thus, there is no definitive malignancy identified   B. BILIARY, STRICTURE, BRUSHING:  - Benign reactive/reparative changes   09/20/2020 Imaging   MRI Abdomen  IMPRESSION: 1. 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 Recommend ERCP/EUS or tissue sampling. 2. There is mild biliary ductal dilatation of the common hepatic bile duct and the intrahepatic bile ducts in the LEFT hepatic lobe. Sequences are severely degraded by patient motion. Concern for obstruction of the common bile duct by the pancreatic head mass. 3. Multiple cystic lesions throughout the liver parenchyma. Assessment of enhancement is difficult due to patient motion as well as no true noncontrast T1 weighted imaging as described above. Favor complex benign cysts but consider liver protocol contrast CT for further evaluation of the cystic lesions to exclude metastasis.   09/21/2020 Imaging   CT CAP  IMPRESSION: 1. Heterogeneous mass of the central pancreatic head measuring 4.5 x 3.3 cm. There is atrophy of the distal pancreatic parenchyma with diffuse pancreatic ductal dilatation up to 0.8 cm. Mass appears to closely abut the lateral surface of the portal vein near the confluence. The superior mesenteric artery is well separated by a fat plane, as is the hepatic artery. 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 The exact borders and size of the mass are  better delineated by prior MR. 2. No evidence of metastatic disease in the chest, abdomen, or pelvis. 3. There are numerous lobulated, fluid attenuating cysts  throughout the hepatic parenchyma, numerous additional subcentimeter lesions are too small to characterize although demonstrate no evidence of contrast enhancement and are likely additional subcentimeter cysts. Attention on follow-up. 4. Status post common bile duct stent with post stenting pneumobilia. 5. Thickening and fat stranding about the gallbladder as well as about the adjacent duodenal bulb and descending duodenum, of uncertain etiology, concerning for cholecystitis, enteritis, and/or pancreatitis, possibly related to recent endoscopic procedure. Aortic Atherosclerosis (ICD10-I70.0).   09/26/2020 Cancer Staging   Staging form: Exocrine Pancreas, AJCC 8th Edition - Clinical stage from 09/26/2020: Stage IIA (cT3, cN0, cM0) - Signed by [name], MD on 09/29/2020  Stage prefix: Initial diagnosis  Total positive nodes: 0    09/26/2020 Procedure   EUS by Dr Benson Norway  IMPRESSION - A mass was identified in the pancreatic head. Cytology results are pending. However, the endosonographic appearance is consistent with adenocarcinoma. Fine needle aspiration performed.   09/26/2020 Initial Biopsy   A. PANCREAS, HEAD, FINE NEEDLE ASPIRATION:  - Malignant cells consistent with adenocarcinoma    09/29/2020 Initial Diagnosis   Pancreatic cancer ([place])    10/07/2020 -  Chemotherapy   First-line Gemcitabine and Abraxane 2 weeks on/1 week off starting 10/07/20.  C1 given with Gemcitabine alone.     Genetic Testing   Negative genetic testing. No pathogenic variants identified on the [facility] CancerNext-Expanded+RNA panel. The report date is 10/16/2020.  The CancerNext-Expanded + RNAinsight gene panel offered by [employer] and includes sequencing and rearrangement analysis for the following 77 genes: IP, ALK, APC*, ATM*, AXIN2, BAP1, BARD1, BLM, BMPR1A, BRCA1*, BRCA2*, BRIP1*, CDC73, CDH1*,CDK4, CDKN1B, CDKN2A, CHEK2*, CTNNA1, DICER1, FANCC, FH, FLCN, GALNT12, KIF1B, LZTR1, MAX, MEN1, MET, MLH1*, MSH2*,  MSH3, MSH6*, MUTYH*, NBN, NF1*, NF2, NTHL1, PALB2*, PHOX2B, PMS2*, POT1, PRKAR1A, PTCH1, PTEN*, RAD51C*, RAD51D*,RB1, RECQL, RET, SDHA, SDHAF2, SDHB, SDHC, SDHD, SMAD4, SMARCA4, SMARCB1, SMARCE1, STK11, SUFU, TMEM127, TP53*,TSC1, TSC2, VHL and XRCC2 (sequencing and deletion/duplication); EGFR, EGLN1, HOXB13, KIT, MITF, PDGFRA, POLD1 and POLE (sequencing only); EPCAM and GREM1 (deletion/duplication only).      CURRENT THERAPY:  First-line Gemcitabine and Abraxane 2 weeks on/1 week off starting 10/07/20. C1 given with Gemcitabine alone.   INTERVAL HISTORY:  Tina Patel is here for a follow up of pancreatic cancer. She was last seen by me on 10/24/20. She presents to the clinic accompanied by her husband. She has gained 5 lbs since her last visit. Her husband feels she has been eating better. She also drinks Ensure. She did well with her last chemo-- she had no nausea, and no itching due to benadryl premed.   All other systems were reviewed with the patient and are negative.  MEDICAL HISTORY:  Past Medical History:  Diagnosis Date   Diabetes ([place])    Diabetes mellitus    Family history of colon cancer    Family history of lung cancer    Family history of multiple myeloma    Family history of prostate cancer    High cholesterol    Hypertension    Hypothyroid     SURGICAL HISTORY: Past Surgical History:  Procedure Laterality Date   BILIARY BRUSHING  09/19/2020   Procedure: BILIARY BRUSHING;  Surgeon: [name], MD;  Location: [facility];  Service: Endoscopy;;   BILIARY STENT PLACEMENT N/A 09/19/2020   Procedure: BILIARY STENT PLACEMENT;  Surgeon: [name], MD;  Location: [facility];  Service: Endoscopy;  Laterality: N/A;   ERCP N/A 09/19/2020   Procedure: ENDOSCOPIC RETROGRADE CHOLANGIOPANCREATOGRAPHY (ERCP);  Surgeon: [name], MD;  Location: [facility];  Service: Endoscopy;  Laterality: N/A;   ESOPHAGOGASTRODUODENOSCOPY (EGD) WITH PROPOFOL N/A 09/19/2020   Procedure:  ESOPHAGOGASTRODUODENOSCOPY (EGD) WITH PROPOFOL;  Surgeon: [name], MD;  Location: [facility];  Service: Endoscopy;  Laterality: N/A;   ESOPHAGOGASTRODUODENOSCOPY (EGD) WITH PROPOFOL N/A 09/26/2020   Procedure: ESOPHAGOGASTRODUODENOSCOPY (EGD) WITH PROPOFOL;  Surgeon: [name], MD;  Location: [facility];  Service: Endoscopy;  Laterality: N/A;   EUS N/A 09/19/2020   Procedure: UPPER ENDOSCOPIC ULTRASOUND (EUS) LINEAR;  Surgeon: [name], MD;  Location: [facility];  Service: Endoscopy;  Laterality: N/A;   FINE NEEDLE ASPIRATION  09/19/2020   Procedure: FINE NEEDLE ASPIRATION;  Surgeon: [name], MD;  Location: [facility];  Service: Endoscopy;;   FINE NEEDLE ASPIRATION N/A 09/26/2020   Procedure: FINE NEEDLE ASPIRATION (FNA) LINEAR;  Surgeon: [name], MD;  Location: [facility];  Service: Endoscopy;  Laterality: N/A;   FINGER SURGERY     SPHINCTEROTOMY  09/19/2020   Procedure: SPHINCTEROTOMY;  Surgeon: [name], MD;  Location: [facility];  Service: Endoscopy;;   UPPER ESOPHAGEAL ENDOSCOPIC ULTRASOUND (EUS) N/A 09/26/2020   Procedure: UPPER ESOPHAGEAL ENDOSCOPIC ULTRASOUND (EUS);  Surgeon: [name], MD;  Location: [facility];  Service: Endoscopy;  Laterality: N/A;    I have reviewed the social history and family history with the patient and they are unchanged from previous note.  ALLERGIES:  has No Known Allergies.  MEDICATIONS:  Current Outpatient Medications  Medication Sig Dispense Refill   amLODipine (NORVASC) 5 MG tablet Take 5 mg by mouth daily.     blood glucose meter kit and supplies KIT Dispense based on patient and insurance preference. Use up to four times daily as directed. 1 each 0   calcium carbonate (TUMS - DOSED IN MG ELEMENTAL CALCIUM) 500 MG chewable tablet Chew 500 mg by mouth daily as needed for indigestion or heartburn.     famotidine (PEPCID) 20 MG tablet Take 20 mg by mouth at bedtime.     furosemide (LASIX) 20 MG tablet Take 20 mg  by mouth daily.     insulin glargine (LANTUS) 100 UNIT/ML Solostar Pen Inject 8 Units into the skin daily. 15 mL 0   levothyroxine (SYNTHROID) 75 MCG tablet Take 75 mcg by mouth daily before breakfast.     lisinopril (ZESTRIL) 10 MG tablet Take 10 mg by mouth daily.     metFORMIN (GLUCOPHAGE-XR) 500 MG 24 hr tablet Take 500 mg by mouth 2 (two) times daily.     mirtazapine (REMERON) 7.5 MG tablet TAKE 1 TABLET BY MOUTH AT BEDTIME. 90 tablet 1   ondansetron (ZOFRAN) 8 MG tablet Take 1 tablet (8 mg total) by mouth 2 (two) times daily as needed (Nausea or vomiting). 30 tablet 1   prochlorperazine (COMPAZINE) 10 MG tablet Take 1 tablet (10 mg total) by mouth every 6 (six) hours as needed (Nausea or vomiting). 30 tablet 1   traMADol (ULTRAM) 50 MG tablet Take 50 mg by mouth at bedtime.     Vitamin D, Ergocalciferol, 50 MCG (2000 UT) CAPS Take 2,000 Units by mouth daily.     No current facility-administered medications for this visit.   Facility-Administered Medications Ordered in Other Visits  Medication Dose Route Frequency Provider Last Rate Last Admin   0.9 %  sodium chloride infusion   Intravenous Once [name], MD  PHYSICAL EXAMINATION: ECOG PERFORMANCE STATUS: 1 - Symptomatic but completely ambulatory  Vitals:   11/10/20 0904  BP: 132/70  Pulse: 76  Resp: 18  Temp: 98.4 F (36.9 C)  SpO2: 100%   Filed Weights   11/10/20 0904  Weight: 141 lb 1.6 oz (64 kg)    Due to COVID19 we will limit examination to appearance. Patient had no complaints.  GENERAL:alert, no distress and comfortable SKIN: skin color normal, no rashes or significant lesions EYES: normal, Conjunctiva are pink and non-injected, sclera clear  NEURO: alert & oriented x 3 with fluent speech  LABORATORY DATA:  I have reviewed the data as listed CBC Latest Ref Rng & Units 11/10/2020 10/24/2020 10/13/2020  WBC 4.0 - 10.5 K/uL 4.9 4.0 2.8(L)  Hemoglobin 12.0 - 15.0 g/dL 11.7(L) 10.9(L) 11.2(L)  Hematocrit 36.0  - 46.0 % 35.0(L) 33.4(L) 33.4(L)  Platelets 150 - 400 K/uL 200 248 230     CMP Latest Ref Rng & Units 11/10/2020 10/24/2020 10/13/2020  Glucose 70 - 99 mg/dL 158(H) 187(H) 98  BUN 8 - 23 mg/dL 20 14 14   Creatinine 0.44 - 1.00 mg/dL 0.80 0.91 0.82  Sodium 135 - 145 mmol/L 141 141 141  Potassium 3.5 - 5.1 mmol/L 4.2 4.6 4.6  Chloride 98 - 111 mmol/L 106 103 103  CO2 22 - 32 mmol/L 25 27 26   Calcium 8.9 - 10.3 mg/dL 9.1 9.0 9.3  Total Protein 6.5 - 8.1 g/dL 6.9 6.9 6.9  Total Bilirubin 0.3 - 1.2 mg/dL 0.5 0.8 1.5(H)  Alkaline Phos 38 - 126 U/L 90 125 155(H)  AST 15 - 41 U/L 23 22 26   ALT 0 - 44 U/L 17 15 19       RADIOGRAPHIC STUDIES: I have personally reviewed the radiological images as listed and agreed with the findings in the report. No results found.   ASSESSMENT & PLAN:  Tina Patel is a 80 y.o. female with   1. 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 Her initial EUS on 09/19/20 showed mass at head of pancreas, but biopsy was negative. She had ERCP stent placement same day. -Her repeat EUS biopsy of the mass on 09/26/20 showed adenocarcinoma from pancreatic primary. Her CT CAP showed mass to be 4.5cm. No evidence of metastatic disease in the chest, abdomen, or pelvis. She does have multiple benign cystic lesions in the liver.  -Per GI tumor board discussion, neoadjuvant chemotherapy was recommended, and referral to a tertiary cancer center for Whipple surgery evaluation.  She established care with Dr. Mariah Milling at [facility] on 10/21/20. -I started her on Gemcitabine 2 weeks on/1 week off starting 10/07/20. We added abraxane with C2 and changed treatment to every 2 weeks. We will continue her benadryl premed, which helped her itching from gemcitabine. -I discussed possibly placing a port given difficulty finding her veins. She would prefer to avoid PAC at this time, but agrees to  think about it  -labs reviewed, adequate to proceed with C3 gemcitabine/abraxane at increased dose given her excellent tolerance  -f/yu in 2 weeks    2. Obstructive Jaundice, Hyperbilirubinemia, Secondary to #1 -She was seen jaundice and elevated Tbili in early June 2022 hospitalization. -She also had RUQ pain recently. This is controlled on Tramadol. -Symptoms much improved after ERCP Stent placement on 09/19/20 with Dr Benson Norway.  -Resolved, Tbili WNL 10/24/20 (results 11/10/20 pending)   3. Weight loss, Fatigue, Secondary to #1 -  For the past 3-6 months she has had 25 pounds weight loss and more fatigued. She is back up to 141 lbs as of 11/10/20. -She has low appetite, nausea and mild early satiety. She forces herself to remain active at home. -I recommend Glucerna for nutritional supplement and appetite stimulant Mirtazapine (prescribed 10/21/20).    4. 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 Genetic Testing -Given her pancreatic cancer she is eligible for genetic testing.  -She was seen by Genetics on 10/13/20. Results are pending.    6. Social Support -She has great social support from her husband 80 yo) whom she lives with and her 3 children who live in town.     PLAN:  -proceed with C3 gemcitabine/abraxane at incrased dose given her excellent tolerance to last cycle  -Labs, flush, f/u, and C4 gemcitabine and abraxane on 11/24/20   No problem-specific Assessment & Plan notes found for this encounter.   Orders Placed This Encounter  Procedures   CA 19.9    Standing Status:   Standing    Number of Occurrences:   20    Standing Expiration Date:   11/10/2021    All questions were answered. The patient knows to call the clinic with any problems, questions or concerns. No barriers to learning was detected. The total time spent in the appointment was 30 minutes.     [name], MD 11/10/2020   I, Tina Patel, am acting as scribe for [name], MD.   I have  reviewed the above documentation for accuracy and completeness, and I agree with the above.

## 2020-11-10 NOTE — Patient Instructions (Signed)
[facility] ONCOL[facility]  Discharge Instructions: Thank you for choosing [facility] to provide your oncology and hematology care.   If you have a lab appointment with the [facility], please go directly to the [facility] and check in at the registration area.   Wear comfortable clothing and clothing appropriate for easy access to any Portacath or PICC line.   We strive to give you quality time with your provider. You may need to reschedule your appointment if you arrive late (15 or more minutes).  Arriving late affects you and other patients whose appointments are after yours.  Also, if you miss three or more appointments without notifying the office, you may be dismissed from the clinic at the provider's discretion.      For prescription refill requests, have your pharmacy contact our office and allow 72 hours for refills to be completed.    Today you received the following chemotherapy and/or immunotherapy agents : Abraxane, Gemzar     To help prevent nausea and vomiting after your treatment, we encourage you to take your nausea medication as directed.  BELOW ARE SYMPTOMS THAT SHOULD BE REPORTED IMMEDIATELY: *FEVER GREATER THAN 100.4 F (38 C) OR HIGHER *CHILLS OR SWEATING *NAUSEA AND VOMITING THAT IS NOT CONTROLLED WITH YOUR NAUSEA MEDICATION *UNUSUAL SHORTNESS OF BREATH *UNUSUAL BRUISING OR BLEEDING *URINARY PROBLEMS (pain or burning when urinating, or frequent urination) *BOWEL PROBLEMS (unusual diarrhea, constipation, pain near the anus) TENDERNESS IN MOUTH AND THROAT WITH OR WITHOUT PRESENCE OF ULCERS (sore throat, sores in mouth, or a toothache) UNUSUAL RASH, SWELLING OR PAIN  UNUSUAL VAGINAL DISCHARGE OR ITCHING   Items with * indicate a potential emergency and should be followed up as soon as possible or go to the Emergency Department if any problems should occur.  Please show the CHEMOTHERAPY ALERT CARD or IMMUNOTHERAPY ALERT CARD at  check-in to the Emergency Department and triage nurse.  Should you have questions after your visit or need to cancel or reschedule your appointment, please contact [facility]  Dept: [phone]  and follow the prompts.  Office hours are 8:00 a.m. to 4:30 p.m. Monday - Friday. Please note that voicemails left after 4:00 p.m. may not be returned until the following business day.  We are closed weekends and major holidays. You have access to a nurse at all times for urgent questions. Please call the main number to the clinic Dept: [phone] and follow the prompts.   For any non-urgent questions, you may also contact your provider using MyChart. We now offer e-Visits for anyone 1 and older to request care online for non-urgent symptoms. For details visit [email or web address].   Also download the MyChart app! Go to the app store, search "MyChart", open the app, select [facility], and log in with your MyChart username and password.  Due to Covid, a mask is required upon entering the hospital/clinic. If you do not have a mask, one will be given to you upon arrival. For doctor visits, patients may have 1 support person aged 79 or older with them. For treatment visits, patients cannot have anyone with them due to current Covid guidelines and our immunocompromised population.

## 2020-11-10 NOTE — Progress Notes (Signed)
I spoke with Tina and Mr Hirschi this am regarding port a cath placement.  I explained the basics of insertion.  I reviewed use and showed them our teaching port a cath.  All questions answered.  Tina Patel states she would like a port a cath.

## 2020-11-11 ENCOUNTER — Telehealth: Payer: Self-pay | Admitting: Hematology

## 2020-11-11 NOTE — Telephone Encounter (Signed)
Unable to leave voicemail with follow-up appointments per 7/25 los. Mailed calendar.

## 2020-11-17 ENCOUNTER — Ambulatory Visit: Payer: Medicare Other | Admitting: Hematology

## 2020-11-17 ENCOUNTER — Other Ambulatory Visit: Payer: Medicare Other

## 2020-11-17 ENCOUNTER — Ambulatory Visit: Payer: Medicare Other

## 2020-11-21 ENCOUNTER — Other Ambulatory Visit: Payer: Self-pay | Admitting: Radiology

## 2020-11-21 MED FILL — Dexamethasone Sodium Phosphate Inj 100 MG/10ML: INTRAMUSCULAR | Qty: 1 | Status: AC

## 2020-11-24 ENCOUNTER — Inpatient Hospital Stay: Payer: Medicare Other | Attending: Hematology

## 2020-11-24 ENCOUNTER — Other Ambulatory Visit: Payer: Self-pay

## 2020-11-24 ENCOUNTER — Inpatient Hospital Stay: Payer: Medicare Other

## 2020-11-24 ENCOUNTER — Inpatient Hospital Stay: Payer: Medicare Other | Admitting: Hematology

## 2020-11-24 ENCOUNTER — Ambulatory Visit (HOSPITAL_COMMUNITY)
Admission: RE | Admit: 2020-11-24 | Discharge: 2020-11-24 | Disposition: A | Discharge status: Home / Self Care | Payer: Medicare Other | Source: Ambulatory Visit | Attending: Hematology | Admitting: Hematology

## 2020-11-24 ENCOUNTER — Encounter (HOSPITAL_COMMUNITY): Payer: Self-pay

## 2020-11-24 VITALS — BP 131/71 | HR 69 | Temp 97.9°F | Resp 18 | Ht 66.0 in | Wt 139.0 lb

## 2020-11-24 DIAGNOSIS — Z79899 Other long term (current) drug therapy: Secondary | ICD-10-CM | POA: Diagnosis not present

## 2020-11-24 DIAGNOSIS — C259 Malignant neoplasm of pancreas, unspecified: Secondary | ICD-10-CM | POA: Insufficient documentation

## 2020-11-24 DIAGNOSIS — I878 Other specified disorders of veins: Secondary | ICD-10-CM

## 2020-11-24 DIAGNOSIS — Z794 Long term (current) use of insulin: Secondary | ICD-10-CM | POA: Insufficient documentation

## 2020-11-24 DIAGNOSIS — C250 Malignant neoplasm of head of pancreas: Secondary | ICD-10-CM | POA: Insufficient documentation

## 2020-11-24 DIAGNOSIS — Z5111 Encounter for antineoplastic chemotherapy: Secondary | ICD-10-CM | POA: Insufficient documentation

## 2020-11-24 DIAGNOSIS — Z7984 Long term (current) use of oral hypoglycemic drugs: Secondary | ICD-10-CM | POA: Diagnosis not present

## 2020-11-24 DIAGNOSIS — E119 Type 2 diabetes mellitus without complications: Secondary | ICD-10-CM | POA: Insufficient documentation

## 2020-11-24 DIAGNOSIS — Z452 Encounter for adjustment and management of vascular access device: Secondary | ICD-10-CM | POA: Diagnosis not present

## 2020-11-24 DIAGNOSIS — Z79890 Hormone replacement therapy (postmenopausal): Secondary | ICD-10-CM | POA: Insufficient documentation

## 2020-11-24 HISTORY — PX: IR IMAGING GUIDED PORT INSERTION: IMG5740

## 2020-11-24 LAB — CBC WITH DIFFERENTIAL (CANCER CENTER ONLY)
Abs Immature Granulocytes: 0.01 10*3/uL (ref 0.00–0.07)
Basophils Absolute: 0 10*3/uL (ref 0.0–0.1)
Basophils Relative: 1 %
Eosinophils Absolute: 0.1 10*3/uL (ref 0.0–0.5)
Eosinophils Relative: 4 %
HCT: 31.3 % — ABNORMAL LOW (ref 36.0–46.0)
Hemoglobin: 10.2 g/dL — ABNORMAL LOW (ref 12.0–15.0)
Immature Granulocytes: 0 %
Lymphocytes Relative: 34 %
Lymphs Abs: 1.3 10*3/uL (ref 0.7–4.0)
MCH: 28.4 pg (ref 26.0–34.0)
MCHC: 32.6 g/dL (ref 30.0–36.0)
MCV: 87.2 fL (ref 80.0–100.0)
Monocytes Absolute: 0.3 10*3/uL (ref 0.1–1.0)
Monocytes Relative: 8 %
Neutro Abs: 2 10*3/uL (ref 1.7–7.7)
Neutrophils Relative %: 53 %
Platelet Count: 222 10*3/uL (ref 150–400)
RBC: 3.59 MIL/uL — ABNORMAL LOW (ref 3.87–5.11)
RDW: 14.8 % (ref 11.5–15.5)
WBC Count: 3.7 10*3/uL — ABNORMAL LOW (ref 4.0–10.5)
nRBC: 0 % (ref 0.0–0.2)

## 2020-11-24 LAB — CMP (CANCER CENTER ONLY)
ALT: 15 U/L (ref 0–44)
AST: 17 U/L (ref 15–41)
Albumin: 3.2 g/dL — ABNORMAL LOW (ref 3.5–5.0)
Alkaline Phosphatase: 80 U/L (ref 38–126)
Anion gap: 9 (ref 5–15)
BUN: 14 mg/dL (ref 8–23)
CO2: 21 mmol/L — ABNORMAL LOW (ref 22–32)
Calcium: 8.9 mg/dL (ref 8.9–10.3)
Chloride: 111 mmol/L (ref 98–111)
Creatinine: 0.79 mg/dL (ref 0.44–1.00)
GFR, Estimated: >60 mL/min (ref >60)
Glucose, Bld: 273 mg/dL — ABNORMAL HIGH (ref 70–99)
Potassium: 4.2 mmol/L (ref 3.5–5.1)
Sodium: 141 mmol/L (ref 135–145)
Total Bilirubin: 0.5 mg/dL (ref 0.3–1.2)
Total Protein: 6.2 g/dL — ABNORMAL LOW (ref 6.5–8.1)

## 2020-11-24 LAB — GLUCOSE, CAPILLARY: Glucose-Capillary: 129 mg/dL — ABNORMAL HIGH (ref 70–99)

## 2020-11-24 MED ORDER — HEPARIN SOD (PORK) LOCK FLUSH 100 UNIT/ML IV SOLN
500.0000 [IU] | Freq: Once | INTRAVENOUS | Status: AC | PRN
  Administered 2020-11-24: 500 [IU]
  Filled 2020-11-24: qty 5

## 2020-11-24 MED ORDER — PACLITAXEL PROTEIN-BOUND CHEMO INJECTION 100 MG
100.0000 mg/m2 | Freq: Once | INTRAVENOUS | Status: AC
  Administered 2020-11-24: 175 mg via INTRAVENOUS
  Filled 2020-11-24: qty 35

## 2020-11-24 MED ORDER — FAMOTIDINE 20 MG IN NS 100 ML IVPB
20.0000 mg | Freq: Once | INTRAVENOUS | Status: AC
  Administered 2020-11-24: 20 mg via INTRAVENOUS

## 2020-11-24 MED ORDER — INSULIN ASPART 100 UNIT/ML IJ SOLN
4.0000 [IU] | Freq: Once | INTRAMUSCULAR | Status: AC
  Administered 2020-11-24: 4 [IU] via SUBCUTANEOUS

## 2020-11-24 MED ORDER — DIPHENHYDRAMINE HCL 25 MG PO CAPS
ORAL_CAPSULE | ORAL | Status: AC
  Filled 2020-11-24: qty 2

## 2020-11-24 MED ORDER — PROCHLORPERAZINE MALEATE 10 MG PO TABS
10.0000 mg | ORAL_TABLET | Freq: Once | ORAL | Status: AC
  Administered 2020-11-24: 10 mg via ORAL

## 2020-11-24 MED ORDER — SODIUM CHLORIDE 0.9 % IV SOLN: INTRAVENOUS | Status: DC

## 2020-11-24 MED ORDER — FENTANYL CITRATE (PF) 100 MCG/2ML IJ SOLN
INTRAMUSCULAR | Status: AC | PRN
  Administered 2020-11-24: 25 ug via INTRAVENOUS
  Administered 2020-11-24: 50 ug via INTRAVENOUS

## 2020-11-24 MED ORDER — MIDAZOLAM HCL 2 MG/2ML IJ SOLN
INTRAMUSCULAR | Status: AC
  Filled 2020-11-24: qty 2

## 2020-11-24 MED ORDER — SODIUM CHLORIDE 0.9% FLUSH
10.0000 mL | INTRAVENOUS | Status: DC | PRN
  Administered 2020-11-24: 10 mL
  Filled 2020-11-24: qty 10

## 2020-11-24 MED ORDER — LIDOCAINE-EPINEPHRINE (PF) 2 %-1:200000 IJ SOLN
INTRAMUSCULAR | Status: AC
  Administered 2020-11-24: 20 mL via SUBCUTANEOUS
  Filled 2020-11-24: qty 20

## 2020-11-24 MED ORDER — DIPHENHYDRAMINE HCL 25 MG PO CAPS
50.0000 mg | ORAL_CAPSULE | Freq: Once | ORAL | Status: AC
  Administered 2020-11-24: 50 mg via ORAL

## 2020-11-24 MED ORDER — FAMOTIDINE 20 MG IN NS 100 ML IVPB
INTRAVENOUS | Status: AC
  Filled 2020-11-24: qty 100

## 2020-11-24 MED ORDER — SODIUM CHLORIDE 0.9 % IV SOLN
Freq: Once | INTRAVENOUS | Status: AC
  Filled 2020-11-24: qty 250

## 2020-11-24 MED ORDER — INSULIN ASPART 100 UNIT/ML IJ SOLN
INTRAMUSCULAR | Status: AC
  Filled 2020-11-24: qty 1

## 2020-11-24 MED ORDER — HEPARIN SOD (PORK) LOCK FLUSH 100 UNIT/ML IV SOLN
INTRAVENOUS | Status: AC | PRN
  Administered 2020-11-24: 500 [IU] via INTRAVENOUS

## 2020-11-24 MED ORDER — MIDAZOLAM HCL 2 MG/2ML IJ SOLN
INTRAMUSCULAR | Status: AC | PRN
  Administered 2020-11-24: 0.5 mg via INTRAVENOUS
  Administered 2020-11-24: 1 mg via INTRAVENOUS

## 2020-11-24 MED ORDER — LIDOCAINE-EPINEPHRINE 1 %-1:100000 IJ SOLN
INTRAMUSCULAR | Status: AC | PRN
  Administered 2020-11-24: 20 mL

## 2020-11-24 MED ORDER — SODIUM CHLORIDE 0.9 % IV SOLN
1000.0000 mg/m2 | Freq: Once | INTRAVENOUS | Status: AC
  Administered 2020-11-24: 1710 mg via INTRAVENOUS
  Filled 2020-11-24: qty 44.97

## 2020-11-24 MED ORDER — HEPARIN SOD (PORK) LOCK FLUSH 100 UNIT/ML IV SOLN
INTRAVENOUS | Status: AC
  Administered 2020-11-24: 5 [IU]
  Filled 2020-11-24: qty 5

## 2020-11-24 MED ORDER — FENTANYL CITRATE (PF) 100 MCG/2ML IJ SOLN
INTRAMUSCULAR | Status: AC
  Filled 2020-11-24: qty 2

## 2020-11-24 MED ORDER — PROCHLORPERAZINE MALEATE 10 MG PO TABS
ORAL_TABLET | ORAL | Status: AC
  Filled 2020-11-24: qty 1

## 2020-11-24 NOTE — Procedures (Signed)
Interventional Radiology Procedure Note ° °Procedure: Single Lumen Power Port Placement   ° °Access:  Right internal jugular vein ° °Findings: Catheter tip positioned at cavoatrial junction. Port is ready for immediate use.  ° °Complications: None ° °EBL: < 10 mL ° °Recommendations:  °- Ok to shower in 24 hours °- Do not submerge for 7 days °- Routine line care  ° ° °Javarion Douty, MD ° ° ° °

## 2020-11-24 NOTE — Patient Instructions (Signed)
[facility]  Discharge Instructions: ?Thank you for choosing [facility] to provide your oncology and hematology care.  ? ?If you have a lab appointment with the Cancer Center, please go directly to the Cancer Center and check in at the registration area. ?  ?Wear comfortable clothing and clothing appropriate for easy access to any Portacath or PICC line.  ? ?We strive to give you quality time with your provider. You may need to reschedule your appointment if you arrive late (15 or more minutes).  Arriving late affects you and other patients whose appointments are after yours.  Also, if you miss three or more appointments without notifying the office, you may be dismissed from the clinic at the provider?s discretion.    ?  ?For prescription refill requests, have your pharmacy contact our office and allow 72 hours for refills to be completed.   ? ?Today you received the following chemotherapy and/or immunotherapy agents: Abraxane and Gemzar    ?  ?To help prevent nausea and vomiting after your treatment, we encourage you to take your nausea medication as directed. ? ?BELOW ARE SYMPTOMS THAT SHOULD BE REPORTED IMMEDIATELY: ?*FEVER GREATER THAN 100.4 F (38 ?C) OR HIGHER ?*CHILLS OR SWEATING ?*NAUSEA AND VOMITING THAT IS NOT CONTROLLED WITH YOUR NAUSEA MEDICATION ?*UNUSUAL SHORTNESS OF BREATH ?*UNUSUAL BRUISING OR BLEEDING ?*URINARY PROBLEMS (pain or burning when urinating, or frequent urination) ?*BOWEL PROBLEMS (unusual diarrhea, constipation, pain near the anus) ?TENDERNESS IN MOUTH AND THROAT WITH OR WITHOUT PRESENCE OF ULCERS (sore throat, sores in mouth, or a toothache) ?UNUSUAL RASH, SWELLING OR PAIN  ?UNUSUAL VAGINAL DISCHARGE OR ITCHING  ? ?Items with * indicate a potential emergency and should be followed up as soon as possible or go to the Emergency Department if any problems should occur. ? ?Please show the CHEMOTHERAPY ALERT CARD or IMMUNOTHERAPY ALERT CARD at  check-in to the Emergency Department and triage nurse. ? ?Should you have questions after your visit or need to cancel or reschedule your appointment, please contact [facility]  Dept: [phone]  and follow the prompts.  Office hours are 8:00 a.m. to 4:30 p.m. Monday - Friday. Please note that voicemails left after 4:00 p.m. may not be returned until the following business day.  We are closed weekends and major holidays. You have access to a nurse at all times for urgent questions. Please call the main number to the clinic Dept: [phone] and follow the prompts. ? ? ?For any non-urgent questions, you may also contact your provider using MyChart. We now offer e-Visits for anyone 18 and older to request care online for non-urgent symptoms. For details visit [email or web address]. ?  ?Also download the MyChart app! Go to the app store, search "MyChart", open the app, select [facility], and log in with your MyChart username and password. ? ?Due to Covid, a mask is required upon entering the hospital/clinic. If you do not have a mask, one will be given to you upon arrival. For doctor visits, patients may have 1 support person aged 18 or older with them. For treatment visits, patients cannot have anyone with them due to current Covid guidelines and our immunocompromised population.  ? ?

## 2020-11-24 NOTE — Consult Note (Signed)
Chief Complaint: Patient was seen in consultation today for image guided port a cath placement   Referring Physician(s): Feng,Yan  Supervising Physician: Ruthann Cancer  Patient Status: [facility] - Out-pt  History of Present Illness: Tina Patel is an 80 y.o. female w/ hx of newly diagnosed pancreatic cancer and poor venous access. Here today for port a cath placement to assist with ontinued chemotherapy.  Past Medical History:  Diagnosis Date   Diabetes ([place])    Diabetes mellitus    Family history of colon cancer    Family history of lung cancer    Family history of multiple myeloma    Family history of prostate cancer    High cholesterol    Hypertension    Hypothyroid     Past Surgical History:  Procedure Laterality Date   BILIARY BRUSHING  09/19/2020   Procedure: BILIARY BRUSHING;  Surgeon: [name], MD;  Location: [facility];  Service: Endoscopy;;   BILIARY STENT PLACEMENT N/A 09/19/2020   Procedure: BILIARY STENT PLACEMENT;  Surgeon: [name], MD;  Location: [facility];  Service: Endoscopy;  Laterality: N/A;   ERCP N/A 09/19/2020   Procedure: ENDOSCOPIC RETROGRADE CHOLANGIOPANCREATOGRAPHY (ERCP);  Surgeon: [name], MD;  Location: [facility];  Service: Endoscopy;  Laterality: N/A;   ESOPHAGOGASTRODUODENOSCOPY (EGD) WITH PROPOFOL N/A 09/19/2020   Procedure: ESOPHAGOGASTRODUODENOSCOPY (EGD) WITH PROPOFOL;  Surgeon: [name], MD;  Location: [facility];  Service: Endoscopy;  Laterality: N/A;   ESOPHAGOGASTRODUODENOSCOPY (EGD) WITH PROPOFOL N/A 09/26/2020   Procedure: ESOPHAGOGASTRODUODENOSCOPY (EGD) WITH PROPOFOL;  Surgeon: [name], MD;  Location: [facility];  Service: Endoscopy;  Laterality: N/A;   EUS N/A 09/19/2020   Procedure: UPPER ENDOSCOPIC ULTRASOUND (EUS) LINEAR;  Surgeon: [name], MD;  Location: [facility];  Service: Endoscopy;  Laterality: N/A;   FINE NEEDLE ASPIRATION  09/19/2020   Procedure: FINE NEEDLE ASPIRATION;  Surgeon:  [name], MD;  Location: [facility];  Service: Endoscopy;;   FINE NEEDLE ASPIRATION N/A 09/26/2020   Procedure: FINE NEEDLE ASPIRATION (FNA) LINEAR;  Surgeon: [name], MD;  Location: [facility];  Service: Endoscopy;  Laterality: N/A;   FINGER SURGERY     SPHINCTEROTOMY  09/19/2020   Procedure: SPHINCTEROTOMY;  Surgeon: [name], MD;  Location: [facility];  Service: Endoscopy;;   UPPER ESOPHAGEAL ENDOSCOPIC ULTRASOUND (EUS) N/A 09/26/2020   Procedure: UPPER ESOPHAGEAL ENDOSCOPIC ULTRASOUND (EUS);  Surgeon: [name], MD;  Location: [facility];  Service: Endoscopy;  Laterality: N/A;    Allergies: Patient has no known allergies.  Medications: Prior to Admission medications   Medication Sig Start Date End Date Taking? Authorizing Provider  amLODipine (NORVASC) 5 MG tablet Take 5 mg by mouth daily. 07/02/20  Yes [provider]  blood glucose meter kit and supplies KIT Dispense based on patient and insurance preference. Use up to four times daily as directed. 09/22/20  Yes Elodia Florence., MD  calcium carbonate (TUMS - DOSED IN MG ELEMENTAL CALCIUM) 500 MG chewable tablet Chew 500 mg by mouth daily as needed for indigestion or heartburn.   Yes [provider]  famotidine (PEPCID) 20 MG tablet Take 20 mg by mouth at bedtime.   Yes [provider]  furosemide (LASIX) 20 MG tablet Take 20 mg by mouth daily. 08/14/20  Yes [provider]  insulin glargine (LANTUS) 100 UNIT/ML Solostar Pen Inject 8 Units into the skin daily. 09/22/20  Yes Elodia Florence., MD  levothyroxine (SYNTHROID) 75 MCG tablet Take 75  mcg by mouth daily before breakfast.   Yes [provider]  lisinopril (ZESTRIL) 10 MG tablet Take 10 mg by mouth daily.   Yes [provider]  metFORMIN (GLUCOPHAGE-XR) 500 MG 24 hr tablet Take 500 mg by mouth 2 (two) times daily. 07/29/20  Yes [provider]  mirtazapine (REMERON) 7.5 MG tablet TAKE 1  TABLET BY MOUTH AT BEDTIME. 10/21/20  Yes Alla Feeling, NP  ondansetron (ZOFRAN) 8 MG tablet Take 1 tablet (8 mg total) by mouth 2 (two) times daily as needed (Nausea or vomiting). 10/03/20  Yes [name], MD  prochlorperazine (COMPAZINE) 10 MG tablet Take 1 tablet (10 mg total) by mouth every 6 (six) hours as needed (Nausea or vomiting). 10/03/20  Yes [name], MD  traMADol (ULTRAM) 50 MG tablet Take 50 mg by mouth at bedtime.   Yes [provider]  Vitamin D, Ergocalciferol, 50 MCG (2000 UT) CAPS Take 2,000 Units by mouth daily.   Yes [provider]     Family History  Problem Relation Age of Onset   Cancer Mother        lung cancer   Prostate cancer Father    Multiple myeloma Sister    Prostate cancer Brother    Cancer - Colon Maternal Aunt    Lung cancer Maternal Uncle    Cancer Son 24       prostate cancer    Social History   Socioeconomic History   Marital status: Married    Spouse name: Not on file   Number of children: 3   Years of education: Not on file   Highest education level: Not on file  Occupational History   Not on file  Tobacco Use   Smoking status: Never   Smokeless tobacco: Never  Substance and Sexual Activity   Alcohol use: No   Drug use: No   Sexual activity: Not on file  Other Topics Concern   Not on file  Social History Narrative   Not on file   Social Determinants of Health   Financial Resource Strain: Not on file  Food Insecurity: Not on file  Transportation Needs: Not on file  Physical Activity: Not on file  Stress: Not on file  Social Connections: Not on file      Review of Systems  Constitutional:  Negative for chills and fever.  Respiratory: Negative.    Cardiovascular: Negative.   Gastrointestinal: Negative.  Negative for nausea and vomiting.     Vital Signs: BP 133/62   Pulse 71   Temp 97.7 F (36.5 C)   Resp 18   Ht 5' 6"  (1.676 m)   Wt 137 lb (62.1 kg)   SpO2 100%   BMI 22.11 kg/m   Physical  Exam Constitutional:      Appearance: Normal appearance.  HENT:     Mouth/Throat:     Mouth: Mucous membranes are moist.  Cardiovascular:     Rate and Rhythm: Normal rate and regular rhythm.  Pulmonary:     Effort: Pulmonary effort is normal.     Breath sounds: Normal breath sounds.  Abdominal:     Palpations: Abdomen is soft.     Tenderness: There is no abdominal tenderness.  Neurological:     Mental Status: She is alert.  Psychiatric:        Mood and Affect: Mood normal.        Behavior: Behavior normal.     Imaging: No results found.  Labs:  CBC: Recent Labs    10/06/20 0759 10/13/20 1148 10/24/20 1502 11/10/20 0855  WBC 4.9 2.8* 4.0 4.9  HGB 12.2 11.2* 10.9* 11.7*  HCT 38.1 33.4* 33.4* 35.0*  PLT 265 230 248 200    COAGS: Recent Labs    09/18/20 1929  INR 0.9  APTT 38*    BMP: Recent Labs    10/06/20 0759 10/13/20 1148 10/24/20 1502 11/10/20 0855  NA 139 141 141 141  K 3.5 4.6 4.6 4.2  CL 103 103 103 106  CO2 28 26 27 25   GLUCOSE 85 98 187* 158*  BUN 19 14 14 20   CALCIUM 9.1 9.3 9.0 9.1  CREATININE 0.77 0.82 0.91 0.80  GFRNONAA >60 >60 >60 >60    LIVER FUNCTION TESTS: Recent Labs    10/06/20 0759 10/13/20 1148 10/24/20 1502 11/10/20 0855  BILITOT 2.4* 1.5* 0.8 0.5  AST 30 26 22 23   ALT 25 19 15 17   ALKPHOS 200* 155* 125 90  PROT 7.5 6.9 6.9 6.9  ALBUMIN 4.0 3.1* 3.3* 3.4*    TUMOR MARKERS: No results for input(s): AFPTM, CEA, CA199, CHROMGRNA in the last 8760 hours.  Assessment and Plan: Hx of pancreatic cancer/poor venous access. Here today for port placement d/t poor venous access and continued chemotherapy.Risks and benefits of image guided port-a-catheter placement was discussed with the patient including, but not limited to bleeding, infection, pneumothorax, or fibrin sheath development and need for additional procedures.  All of the patient's questions were answered, patient is agreeable to proceed. Consent signed and in  chart.   Thank you for this interesting consult.  I greatly enjoyed meeting Tina Patel and look forward to participating in their care.  A copy of this report was sent to the requesting provider on this date.  Electronically Signed: Tyson Alias, NP 11/24/2020, 9:00 AM   I spent a total of 20 mins in face to face in clinical consultation, greater than 50% of which was counseling/coordinating care for port-a cath placement.

## 2020-11-24 NOTE — Discharge Instructions (Signed)

## 2020-11-24 NOTE — Progress Notes (Signed)
[facility] consulted returned page and asked to obtain IV access. While enroute to infusion center, CC nurse called and stated IVT no longer needed.

## 2020-11-24 NOTE — Progress Notes (Signed)
[place]   [phone]) [phone] Fax:(336) [phone]   Clinic Follow up Note   Patient Care Team: [name], DO as PCP - General (Family Medicine) [name], RN (Inactive) as Oncology Nurse Navigator [name], MD as Consulting Physician (Oncology) [name], MD as Consulting Physician (Gastroenterology)  Date of Service:  11/24/2020  CHIEF COMPLAINT: f/u of pancreatic cancer  SUMMARY OF ONCOLOGIC HISTORY: Oncology History Overview Note  Cancer Staging Pancreatic cancer [facility]) Staging form: Exocrine Pancreas, AJCC 8th Edition - Clinical stage from 09/26/2020: Stage IB (cT2, cN0, cM0) - Signed by [name], MD on 09/29/2020 Stage prefix: Initial diagnosis Total positive nodes: 0    Pancreatic cancer ([place])  09/18/2020 Imaging   US Abdomen  IMPRESSION: Intrahepatic and extrahepatic biliary ductal dilatation. Common bile duct dilated up to 13 mm. This could be further evaluated with MRCP or ERCP.   Sludge within the gallbladder with associated gallbladder wall thickening and pericholecystic fluid. Cannot exclude acute cholecystitis.   Multiple hepatic cysts. Complex cystic area posteriorly in the right hepatic lobe. This could also be further evaluated with MRI.   09/19/2020 Procedure   EUS and ERCP by Dr Benson Norway   EUS IMPRESSION - A mass was identified in the pancreatic head. The staging applies if malignancy is confirmed. Fine needle aspiration performed. - There was dilation in the common hepatic duct which measured up to 13 mm. - There was dilation in the intrahepatic bile ducts, diffusely. - A cyst was found in the left lobe of the liver and measured 24 mm by 15 mm. - Endosonographic images of the left adrenal gland were unremarkable.  ERCP IMPRESSION - The major papilla appeared normal. - A biliary sphincterotomy was performed. - Cells for cytology obtained distal CBD. - One covered metal stent was placed into the common bile duct.      09/19/2020 Pathology Results   A. PANCREAS, HEAD, FINE NEEDLE ASPIRATION:  - Benign reactive/reparative changes  - Bland spindle cell fragments   DIAGNOSTIC COMMENTS:  There are fragments of bland spindle cells, favored to represent smooth muscle tissue. The glandular fragments are also bland and thus, there is no definitive malignancy identified   B. BILIARY, STRICTURE, BRUSHING:  - Benign reactive/reparative changes   09/20/2020 Imaging   MRI Abdomen  IMPRESSION: 1. 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 Recommend ERCP/EUS or tissue sampling. 2. There is mild biliary ductal dilatation of the common hepatic bile duct and the intrahepatic bile ducts in the LEFT hepatic lobe. Sequences are severely degraded by patient motion. Concern for obstruction of the common bile duct by the pancreatic head mass. 3. Multiple cystic lesions throughout the liver parenchyma. Assessment of enhancement is difficult due to patient motion as well as no true noncontrast T1 weighted imaging as described above. Favor complex benign cysts but consider liver protocol contrast CT for further evaluation of the cystic lesions to exclude metastasis.   09/21/2020 Imaging   CT CAP  IMPRESSION: 1. Heterogeneous mass of the central pancreatic head measuring 4.5 x 3.3 cm. There is atrophy of the distal pancreatic parenchyma with diffuse pancreatic ductal dilatation up to 0.8 cm. Mass appears to closely abut the lateral surface of the portal vein near the confluence. The superior mesenteric artery is well separated by a fat plane, as is the hepatic artery. 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 The exact borders and size of the mass are  better delineated by prior MR. 2. No evidence of metastatic disease in the chest, abdomen, or pelvis. 3. There are numerous lobulated, fluid attenuating cysts throughout the  hepatic parenchyma, numerous additional subcentimeter lesions are too small to characterize although demonstrate no evidence of contrast enhancement and are likely additional subcentimeter cysts. Attention on follow-up. 4. Status post common bile duct stent with post stenting pneumobilia. 5. Thickening and fat stranding about the gallbladder as well as about the adjacent duodenal bulb and descending duodenum, of uncertain etiology, concerning for cholecystitis, enteritis, and/or pancreatitis, possibly related to recent endoscopic procedure. Aortic Atherosclerosis (ICD10-I70.0).   09/26/2020 Cancer Staging   Staging form: Exocrine Pancreas, AJCC 8th Edition - Clinical stage from 09/26/2020: Stage IIA (cT3, cN0, cM0) - Signed by [name], MD on 09/29/2020  Stage prefix: Initial diagnosis  Total positive nodes: 0    09/26/2020 Procedure   EUS by Dr Benson Norway  IMPRESSION - A mass was identified in the pancreatic head. Cytology results are pending. However, the endosonographic appearance is consistent with adenocarcinoma. Fine needle aspiration performed.   09/26/2020 Initial Biopsy   A. PANCREAS, HEAD, FINE NEEDLE ASPIRATION:  - Malignant cells consistent with adenocarcinoma    09/29/2020 Initial Diagnosis   Pancreatic cancer ([place])    10/07/2020 -  Chemotherapy   First-line Gemcitabine and Abraxane 2 weeks on/1 week off starting 10/07/20.  C1 given with Gemcitabine alone.     Genetic Testing   Negative genetic testing. No pathogenic variants identified on the [facility] CancerNext-Expanded+RNA panel. The report date is 10/16/2020.  The CancerNext-Expanded + RNAinsight gene panel offered by [employer] and includes sequencing and rearrangement analysis for the following 77 genes: IP, ALK, APC*, ATM*, AXIN2, BAP1, BARD1, BLM, BMPR1A, BRCA1*, BRCA2*, BRIP1*, CDC73, CDH1*,CDK4, CDKN1B, CDKN2A, CHEK2*, CTNNA1, DICER1, FANCC, FH, FLCN, GALNT12, KIF1B, LZTR1, MAX, MEN1, MET, MLH1*, MSH2*, MSH3, MSH6*,  MUTYH*, NBN, NF1*, NF2, NTHL1, PALB2*, PHOX2B, PMS2*, POT1, PRKAR1A, PTCH1, PTEN*, RAD51C*, RAD51D*,RB1, RECQL, RET, SDHA, SDHAF2, SDHB, SDHC, SDHD, SMAD4, SMARCA4, SMARCB1, SMARCE1, STK11, SUFU, TMEM127, TP53*,TSC1, TSC2, VHL and XRCC2 (sequencing and deletion/duplication); EGFR, EGLN1, HOXB13, KIT, MITF, PDGFRA, POLD1 and POLE (sequencing only); EPCAM and GREM1 (deletion/duplication only).      CURRENT THERAPY:  First-line Gemcitabine and Abraxane 2 weeks on/1 week off starting 10/07/20. C1 given with Gemcitabine alone.  INTERVAL HISTORY:  Tina Patel is here for a follow up of pancreatic cancer. She was last seen by me on 11/10/20. I saw her in infusion room today, I call her husband during her visit.   She tolerated last cycle chemo overall well, with some fatigue, no skin rash or itchiness. Her BG was OK at home, today 273. No other new complains.    All other systems were reviewed with the patient and are negative.  MEDICAL HISTORY:  Past Medical History:  Diagnosis Date   Diabetes ([place])    Diabetes mellitus    Family history of colon cancer    Family history of lung cancer    Family history of multiple myeloma    Family history of prostate cancer    High cholesterol    Hypertension    Hypothyroid     SURGICAL HISTORY: Past Surgical History:  Procedure Laterality Date   BILIARY BRUSHING  09/19/2020   Procedure: BILIARY BRUSHING;  Surgeon: [name], MD;  Location: [facility];  Service: Endoscopy;;   BILIARY STENT PLACEMENT N/A 09/19/2020   Procedure: BILIARY STENT PLACEMENT;  Surgeon: [name], MD;  Location: [facility];  Service: Endoscopy;  Laterality: N/A;   ERCP N/A 09/19/2020   Procedure: ENDOSCOPIC RETROGRADE CHOLANGIOPANCREATOGRAPHY (ERCP);  Surgeon: [name], MD;  Location: [facility];  Service: Endoscopy;  Laterality: N/A;   ESOPHAGOGASTRODUODENOSCOPY (EGD) WITH PROPOFOL N/A 09/19/2020   Procedure: ESOPHAGOGASTRODUODENOSCOPY (EGD) WITH PROPOFOL;   Surgeon: [name], MD;  Location: [facility];  Service: Endoscopy;  Laterality: N/A;   ESOPHAGOGASTRODUODENOSCOPY (EGD) WITH PROPOFOL N/A 09/26/2020   Procedure: ESOPHAGOGASTRODUODENOSCOPY (EGD) WITH PROPOFOL;  Surgeon: [name], MD;  Location: [facility];  Service: Endoscopy;  Laterality: N/A;   EUS N/A 09/19/2020   Procedure: UPPER ENDOSCOPIC ULTRASOUND (EUS) LINEAR;  Surgeon: [name], MD;  Location: [facility];  Service: Endoscopy;  Laterality: N/A;   FINE NEEDLE ASPIRATION  09/19/2020   Procedure: FINE NEEDLE ASPIRATION;  Surgeon: [name], MD;  Location: [facility];  Service: Endoscopy;;   FINE NEEDLE ASPIRATION N/A 09/26/2020   Procedure: FINE NEEDLE ASPIRATION (FNA) LINEAR;  Surgeon: [name], MD;  Location: [facility];  Service: Endoscopy;  Laterality: N/A;   FINGER SURGERY     IR IMAGING GUIDED PORT INSERTION  11/24/2020   SPHINCTEROTOMY  09/19/2020   Procedure: SPHINCTEROTOMY;  Surgeon: [name], MD;  Location: [facility];  Service: Endoscopy;;   UPPER ESOPHAGEAL ENDOSCOPIC ULTRASOUND (EUS) N/A 09/26/2020   Procedure: UPPER ESOPHAGEAL ENDOSCOPIC ULTRASOUND (EUS);  Surgeon: [name], MD;  Location: [facility];  Service: Endoscopy;  Laterality: N/A;    I have reviewed the social history and family history with the patient and they are unchanged from previous note.  ALLERGIES:  has No Known Allergies.  MEDICATIONS:  Current Outpatient Medications  Medication Sig Dispense Refill   amLODipine (NORVASC) 5 MG tablet Take 5 mg by mouth daily.     blood glucose meter kit and supplies KIT Dispense based on patient and insurance preference. Use up to four times daily as directed. 1 each 0   calcium carbonate (TUMS - DOSED IN MG ELEMENTAL CALCIUM) 500 MG chewable tablet Chew 500 mg by mouth daily as needed for indigestion or heartburn.     famotidine (PEPCID) 20 MG tablet Take 20 mg by mouth at bedtime.     furosemide (LASIX) 20 MG tablet Take 20 mg by  mouth daily.     insulin glargine (LANTUS) 100 UNIT/ML Solostar Pen Inject 8 Units into the skin daily. 15 mL 0   levothyroxine (SYNTHROID) 75 MCG tablet Take 75 mcg by mouth daily before breakfast.     lisinopril (ZESTRIL) 10 MG tablet Take 10 mg by mouth daily.     metFORMIN (GLUCOPHAGE-XR) 500 MG 24 hr tablet Take 500 mg by mouth 2 (two) times daily.     mirtazapine (REMERON) 7.5 MG tablet TAKE 1 TABLET BY MOUTH AT BEDTIME. 90 tablet 1   ondansetron (ZOFRAN) 8 MG tablet Take 1 tablet (8 mg total) by mouth 2 (two) times daily as needed (Nausea or vomiting). 30 tablet 1   prochlorperazine (COMPAZINE) 10 MG tablet Take 1 tablet (10 mg total) by mouth every 6 (six) hours as needed (Nausea or vomiting). 30 tablet 1   traMADol (ULTRAM) 50 MG tablet Take 50 mg by mouth at bedtime.     Vitamin D, Ergocalciferol, 50 MCG (2000 UT) CAPS Take 2,000 Units by mouth daily.     No current facility-administered medications for this visit.   Facility-Administered Medications Ordered in Other Visits  Medication Dose Route Frequency Provider Last Rate Last Admin   0.9 %  sodium chloride infusion  Intravenous Continuous Allred, Darrell K, PA-C 50 mL/hr at 11/24/20 0851 New Bag at 11/24/20 0851   fentaNYL (SUBLIMAZE) 100 MCG/2ML injection            midazolam (VERSED) 2 MG/2ML injection             PHYSICAL EXAMINATION: ECOG PERFORMANCE STATUS: 1 - Symptomatic but completely ambulatory  There were no vitals filed for this visit. Wt Readings from Last 3 Encounters:  11/24/20 137 lb (62.1 kg)  11/10/20 141 lb 1.6 oz (64 kg)  10/24/20 135 lb 8 oz (61.5 kg)     GENERAL:alert, no distress and comfortable SKIN: skin color, texture, turgor are normal, no rashes or significant lesions EYES: normal, Conjunctiva are pink and non-injected, sclera clear  Musculoskeletal:no cyanosis of digits and no clubbing  NEURO: alert & oriented x 3 with fluent speech, no focal motor/sensory deficits  LABORATORY DATA:  I  have reviewed the data as listed CBC Latest Ref Rng & Units 11/24/2020 11/10/2020 10/24/2020  WBC 4.0 - 10.5 K/uL 3.7(L) 4.9 4.0  Hemoglobin 12.0 - 15.0 g/dL 10.2(L) 11.7(L) 10.9(L)  Hematocrit 36.0 - 46.0 % 31.3(L) 35.0(L) 33.4(L)  Platelets 150 - 400 K/uL 222 200 248     CMP Latest Ref Rng & Units 11/24/2020 11/10/2020 10/24/2020  Glucose 70 - 99 mg/dL 273(H) 158(H) 187(H)  BUN 8 - 23 mg/dL 14 20 14   Creatinine 0.44 - 1.00 mg/dL 0.79 0.80 0.91  Sodium 135 - 145 mmol/L 141 141 141  Potassium 3.5 - 5.1 mmol/L 4.2 4.2 4.6  Chloride 98 - 111 mmol/L 111 106 103  CO2 22 - 32 mmol/L 21(L) 25 27  Calcium 8.9 - 10.3 mg/dL 8.9 9.1 9.0  Total Protein 6.5 - 8.1 g/dL 6.2(L) 6.9 6.9  Total Bilirubin 0.3 - 1.2 mg/dL 0.5 0.5 0.8  Alkaline Phos 38 - 126 U/L 80 90 125  AST 15 - 41 U/L 17 23 22   ALT 0 - 44 U/L 15 17 15       RADIOGRAPHIC STUDIES: I have personally reviewed the radiological images as listed and agreed with the findings in the report. IR IMAGING GUIDED PORT INSERTION  Result Date: 11/24/2020 INDICATION: 80 year old female with history of pancreatic cancer requiring central venous access for chemotherapy and vascular access. EXAM: IMPLANTED PORT A CATH PLACEMENT WITH ULTRASOUND AND FLUOROSCOPIC GUIDANCE COMPARISON:  None. MEDICATIONS: None. ANESTHESIA/SEDATION: Moderate (conscious) sedation was employed during this procedure. A total of Versed 1.5 mg and Fentanyl 75 mcg was administered intravenously. Moderate Sedation Time: 13 minutes. The patient's level of consciousness and vital signs were monitored continuously by radiology nursing throughout the procedure under my direct supervision. CONTRAST:  None FLUOROSCOPY TIME:  0 minutes, 6 seconds (1 mGy) COMPLICATIONS: None immediate. PROCEDURE: The procedure, risks, benefits, and alternatives were explained to the patient. Questions regarding the procedure were encouraged and answered. The patient understands and consents to the procedure. The right  neck and chest were prepped with chlorhexidine in a sterile fashion, and a sterile drape was applied covering the operative field. Maximum barrier sterile technique with sterile gowns and gloves were used for the procedure. A timeout was performed prior to the initiation of the procedure. Ultrasound was used to examine the jugular vein which was compressible and free of internal echoes. A skin marker was used to demarcate the planned venotomy and port pocket incision sites. Local anesthesia was provided to these sites and the subcutaneous tunnel track with 1% lidocaine with 1:100,000 epinephrine. A small incision was  created at the jugular access site and blunt dissection was performed of the subcutaneous tissues. Under ultrasound guidance, the jugular vein was accessed with a 21 ga micropuncture needle and an 0.018" wire was inserted to the superior vena cava. Real-time ultrasound guidance was utilized for vascular access including the acquisition of a permanent ultrasound image documenting patency of the accessed vessel. A 5 Fr micopuncture set was then used, through which a 0.035" Rosen wire was passed under fluoroscopic guidance into the inferior vena cava. An 8 Fr dilator was then placed over the wire. A subcutaneous port pocket was then created along the upper chest wall utilizing a combination of sharp and blunt dissection. The pocket was irrigated with sterile saline, packed with gauze, and observed for hemorrhage. A single lumen "ISP" sized power injectable port was chosen for placement. The 8 Fr catheter was tunneled from the port pocket site to the venotomy incision. The port was placed in the pocket. The external catheter was trimmed to appropriate length. The dilator was exchanged for an 8 Fr peel-away sheath under fluoroscopic guidance. The catheter was then placed through the sheath and the sheath was removed. Final catheter positioning was confirmed and documented with a fluoroscopic spot radiograph.  The port was accessed with a Huber needle, aspirated, and flushed with heparinized saline. The deep dermal layer of the port pocket incision was closed with interrupted 3-0 Vicryl suture. Dermabond was then placed over the port pocket and neck incisions. The patient tolerated the procedure well without immediate post procedural complication. FINDINGS: After catheter placement, the tip lies within the superior cavoatrial junction. The catheter aspirates and flushes normally and is ready for immediate use. IMPRESSION: Successful placement of a power injectable Port-A-Cath via the right internal jugular vein. The catheter is ready for immediate use. Ruthann Cancer, MD [facility] Radiology Electronically Signed   By: Ruthann Cancer MD   On: 11/24/2020 10:01     ASSESSMENT & PLAN:  Tina Patel is a 80 y.o. female with   1. 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 Her initial EUS on 09/19/20 showed mass at head of pancreas, but biopsy was negative. She had ERCP stent placement same day. -Her repeat EUS biopsy of the mass on 09/26/20 showed adenocarcinoma from pancreatic primary. Her CT CAP showed mass to be 4.5cm. No evidence of metastatic disease in the chest, abdomen, or pelvis. She does have multiple benign cystic lesions in the liver.  -Per GI tumor board discussion, neoadjuvant chemotherapy was recommended, and referral to a tertiary cancer center for Whipple surgery evaluation.  She established care with Dr. Mariah Milling at [facility] on 10/21/20. -I started her on Gemcitabine 2 weeks on/1 week off starting 10/07/20. We added abraxane with C2 and changed treatment to every 2 weeks. We will continue her benadryl premed, which helped her itching from gemcitabine. -PAC placed this morning -labs reviewed, adequate to proceed with C4 gemcitabine/abraxane at same dose   -f/u in 2 weeks    2. Obstructive Jaundice, Hyperbilirubinemia, Secondary to #1 -She was seen jaundice and elevated Tbili in early June 2022 hospitalization. -She also had RUQ pain recently. This is controlled on Tramadol. -Symptoms much improved after ERCP Stent placement on 09/19/20 with Dr Benson Norway.  -Resolved   3. Weight loss, Fatigue, Secondary to #1 -For the past 3-6 months she has had 25 pounds weight loss and more fatigued. She is  back up to 141 lbs as of 11/10/20. -She has low appetite, nausea and mild early satiety. She forces herself to remain active at home. -I recommend Glucerna for nutritional supplement and appetite stimulant Mirtazapine (prescribed 10/21/20).   4. 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 Genetic Testing -Given her pancreatic cancer she is eligible for genetic testing.  -She was seen by Genetics on 10/13/20. Results were negative.   6. Social Support -She has great social support from her husband 80 yo), whom she lives with, and her 3 children who live in town.     PLAN:  -proceed with C4 gemcitabine/abraxane -Labs, flush, f/u, and C5 gemcitabine and abraxane in 2 weeks    No problem-specific Assessment & Plan notes found for this encounter.   No orders of the defined types were placed in this encounter.  All questions were answered. The patient knows to call the clinic with any problems, questions or concerns. No barriers to learning was detected. The total time spent in the appointment was 30 minutes.     [name], MD 11/24/2020   I, Wilburn Mylar, am acting as scribe for [name], MD.   I have reviewed the above documentation for accuracy and completeness, and I agree with the above.

## 2020-11-25 LAB — CANCER ANTIGEN 19-9: CA 19-9: 21 U/mL (ref 0–35)

## 2020-11-26 DIAGNOSIS — E118 Type 2 diabetes mellitus with unspecified complications: Secondary | ICD-10-CM | POA: Diagnosis not present

## 2020-11-26 DIAGNOSIS — I10 Essential (primary) hypertension: Secondary | ICD-10-CM | POA: Diagnosis not present

## 2020-11-29 ENCOUNTER — Encounter: Payer: Self-pay | Admitting: Hematology

## 2020-12-02 DIAGNOSIS — C259 Malignant neoplasm of pancreas, unspecified: Secondary | ICD-10-CM | POA: Diagnosis not present

## 2020-12-02 DIAGNOSIS — I10 Essential (primary) hypertension: Secondary | ICD-10-CM | POA: Diagnosis not present

## 2020-12-02 DIAGNOSIS — Z0000 Encounter for general adult medical examination without abnormal findings: Secondary | ICD-10-CM | POA: Diagnosis not present

## 2020-12-02 DIAGNOSIS — E785 Hyperlipidemia, unspecified: Secondary | ICD-10-CM | POA: Diagnosis not present

## 2020-12-02 DIAGNOSIS — E039 Hypothyroidism, unspecified: Secondary | ICD-10-CM | POA: Diagnosis not present

## 2020-12-02 DIAGNOSIS — E119 Type 2 diabetes mellitus without complications: Secondary | ICD-10-CM | POA: Diagnosis not present

## 2020-12-08 ENCOUNTER — Encounter: Payer: Self-pay | Admitting: Hematology

## 2020-12-08 ENCOUNTER — Inpatient Hospital Stay: Payer: Medicare Other

## 2020-12-08 ENCOUNTER — Other Ambulatory Visit: Payer: Self-pay

## 2020-12-08 ENCOUNTER — Inpatient Hospital Stay: Payer: Medicare Other | Admitting: Hematology

## 2020-12-08 VITALS — BP 146/66 | HR 70 | Temp 97.8°F | Resp 18 | Ht 66.0 in | Wt 140.1 lb

## 2020-12-08 DIAGNOSIS — C250 Malignant neoplasm of head of pancreas: Secondary | ICD-10-CM

## 2020-12-08 DIAGNOSIS — E119 Type 2 diabetes mellitus without complications: Secondary | ICD-10-CM | POA: Diagnosis not present

## 2020-12-08 DIAGNOSIS — Z794 Long term (current) use of insulin: Secondary | ICD-10-CM | POA: Diagnosis not present

## 2020-12-08 DIAGNOSIS — Z5111 Encounter for antineoplastic chemotherapy: Secondary | ICD-10-CM | POA: Diagnosis not present

## 2020-12-08 LAB — CBC WITH DIFFERENTIAL (CANCER CENTER ONLY)
Abs Immature Granulocytes: 0.02 10*3/uL (ref 0.00–0.07)
Basophils Absolute: 0 10*3/uL (ref 0.0–0.1)
Basophils Relative: 1 %
Eosinophils Absolute: 0.2 10*3/uL (ref 0.0–0.5)
Eosinophils Relative: 4 %
HCT: 33.2 % — ABNORMAL LOW (ref 36.0–46.0)
Hemoglobin: 11 g/dL — ABNORMAL LOW (ref 12.0–15.0)
Immature Granulocytes: 1 %
Lymphocytes Relative: 34 %
Lymphs Abs: 1.5 10*3/uL (ref 0.7–4.0)
MCH: 29.3 pg (ref 26.0–34.0)
MCHC: 33.1 g/dL (ref 30.0–36.0)
MCV: 88.3 fL (ref 80.0–100.0)
Monocytes Absolute: 0.5 10*3/uL (ref 0.1–1.0)
Monocytes Relative: 10 %
Neutro Abs: 2.2 10*3/uL (ref 1.7–7.7)
Neutrophils Relative %: 50 %
Platelet Count: 237 10*3/uL (ref 150–400)
RBC: 3.76 MIL/uL — ABNORMAL LOW (ref 3.87–5.11)
RDW: 15 % (ref 11.5–15.5)
WBC Count: 4.3 10*3/uL (ref 4.0–10.5)
nRBC: 0 % (ref 0.0–0.2)

## 2020-12-08 LAB — CMP (CANCER CENTER ONLY)
ALT: 14 U/L (ref 0–44)
AST: 18 U/L (ref 15–41)
Albumin: 3.5 g/dL (ref 3.5–5.0)
Alkaline Phosphatase: 92 U/L (ref 38–126)
Anion gap: 10 (ref 5–15)
BUN: 13 mg/dL (ref 8–23)
CO2: 25 mmol/L (ref 22–32)
Calcium: 9.2 mg/dL (ref 8.9–10.3)
Chloride: 108 mmol/L (ref 98–111)
Creatinine: 0.8 mg/dL (ref 0.44–1.00)
GFR, Estimated: >60 mL/min (ref >60)
Glucose, Bld: 82 mg/dL (ref 70–99)
Potassium: 4.2 mmol/L (ref 3.5–5.1)
Sodium: 143 mmol/L (ref 135–145)
Total Bilirubin: 0.4 mg/dL (ref 0.3–1.2)
Total Protein: 6.7 g/dL (ref 6.5–8.1)

## 2020-12-08 MED ORDER — SODIUM CHLORIDE 0.9% FLUSH
10.0000 mL | INTRAVENOUS | Status: DC | PRN
Start: 2020-12-08 — End: 2020-12-08
  Administered 2020-12-08: 10 mL

## 2020-12-08 MED ORDER — HEPARIN SOD (PORK) LOCK FLUSH 100 UNIT/ML IV SOLN
500.0000 [IU] | Freq: Once | INTRAVENOUS | Status: AC | PRN
  Administered 2020-12-08: 500 [IU]

## 2020-12-08 MED ORDER — SODIUM CHLORIDE 0.9 % IV SOLN: Freq: Once | INTRAVENOUS | Status: AC

## 2020-12-08 MED ORDER — PACLITAXEL PROTEIN-BOUND CHEMO INJECTION 100 MG
100.0000 mg/m2 | Freq: Once | INTRAVENOUS | Status: AC
  Administered 2020-12-08: 175 mg via INTRAVENOUS
  Filled 2020-12-08: qty 35

## 2020-12-08 MED ORDER — FAMOTIDINE 20 MG IN NS 100 ML IVPB
20.0000 mg | Freq: Once | INTRAVENOUS | Status: AC
  Administered 2020-12-08: 20 mg via INTRAVENOUS

## 2020-12-08 MED ORDER — LIDOCAINE-PRILOCAINE 2.5-2.5 % EX CREA: 1.0000 "application " | TOPICAL_CREAM | CUTANEOUS | 2 refills | Status: AC | PRN

## 2020-12-08 MED ORDER — PROCHLORPERAZINE MALEATE 10 MG PO TABS
10.0000 mg | ORAL_TABLET | Freq: Once | ORAL | Status: AC
  Administered 2020-12-08: 10 mg via ORAL

## 2020-12-08 MED ORDER — DIPHENHYDRAMINE HCL 25 MG PO CAPS
50.0000 mg | ORAL_CAPSULE | Freq: Once | ORAL | Status: AC
  Administered 2020-12-08: 50 mg via ORAL

## 2020-12-08 MED ORDER — SODIUM CHLORIDE 0.9 % IV SOLN
1000.0000 mg/m2 | Freq: Once | INTRAVENOUS | Status: AC
  Administered 2020-12-08: 1710 mg via INTRAVENOUS
  Filled 2020-12-08: qty 44.97

## 2020-12-08 NOTE — Patient Instructions (Signed)
[facility] MEDICAL ONCOLOGY  Discharge Instructions: ?Thank you for choosing [facility] to provide your oncology and hematology care.  ? ?If you have a lab appointment with the Cancer Center, please go directly to the Cancer Center and check in at the registration area. ?  ?Wear comfortable clothing and clothing appropriate for easy access to any Portacath or PICC line.  ? ?We strive to give you quality time with your provider. You may need to reschedule your appointment if you arrive late (15 or more minutes).  Arriving late affects you and other patients whose appointments are after yours.  Also, if you miss three or more appointments without notifying the office, you may be dismissed from the clinic at the provider?s discretion.    ?  ?For prescription refill requests, have your pharmacy contact our office and allow 72 hours for refills to be completed.   ? ?Today you received the following chemotherapy and/or immunotherapy agents: Abraxane and Gemzar    ?  ?To help prevent nausea and vomiting after your treatment, we encourage you to take your nausea medication as directed. ? ?BELOW ARE SYMPTOMS THAT SHOULD BE REPORTED IMMEDIATELY: ?*FEVER GREATER THAN 100.4 F (38 ?C) OR HIGHER ?*CHILLS OR SWEATING ?*NAUSEA AND VOMITING THAT IS NOT CONTROLLED WITH YOUR NAUSEA MEDICATION ?*UNUSUAL SHORTNESS OF BREATH ?*UNUSUAL BRUISING OR BLEEDING ?*URINARY PROBLEMS (pain or burning when urinating, or frequent urination) ?*BOWEL PROBLEMS (unusual diarrhea, constipation, pain near the anus) ?TENDERNESS IN MOUTH AND THROAT WITH OR WITHOUT PRESENCE OF ULCERS (sore throat, sores in mouth, or a toothache) ?UNUSUAL RASH, SWELLING OR PAIN  ?UNUSUAL VAGINAL DISCHARGE OR ITCHING  ? ?Items with * indicate a potential emergency and should be followed up as soon as possible or go to the Emergency Department if any problems should occur. ? ?Please show the CHEMOTHERAPY ALERT CARD or IMMUNOTHERAPY ALERT CARD at  check-in to the Emergency Department and triage nurse. ? ?Should you have questions after your visit or need to cancel or reschedule your appointment, please contact [facility] MEDICAL ONCOLOG[facility]  Dept: [phone]  and follow the prompts.  Office hours are 8:00 a.m. to 4:30 p.m. Monday - Friday. Please note that voicemails left after 4:00 p.m. may not be returned until the following business day.  We are closed weekends and major holidays. You have access to a nurse at all times for urgent questions. Please call the main number to the clinic Dept: [phone] and follow the prompts. ? ? ?For any non-urgent questions, you may also contact your provider using MyChart. We now offer e-Visits for anyone 18 and older to request care online for non-urgent symptoms. For details visit [email or web address]. ?  ?Also download the MyChart app! Go to the app store, search "MyChart", open the app, select [facility], and log in with your MyChart username and password. ? ?Due to Covid, a mask is required upon entering the hospital/clinic. If you do not have a mask, one will be given to you upon arrival. For doctor visits, patients may have 1 support person aged 18 or older with them. For treatment visits, patients cannot have anyone with them due to current Covid guidelines and our immunocompromised population.  ? ?

## 2020-12-08 NOTE — Progress Notes (Signed)
[place]   [phone] [phone]   Clinic Follow up Note   Patient Care Team: [name], DO as PCP - General (Family Medicine) [name], RN (Inactive) as Oncology Nurse Navigator [name], MD as Consulting Physician (Oncology) [name], MD as Consulting Physician (Gastroenterology)  Date of Service:  12/08/2020  CHIEF COMPLAINT: f/u of pancreatic cancer  SUMMARY OF ONCOLOGIC HISTORY: Oncology History Overview Note  Cancer Staging Pancreatic cancer [facility]) Staging form: Exocrine Pancreas, AJCC 8th Edition - Clinical stage from 09/26/2020: Stage IB (cT2, cN0, cM0) - Signed by [name], MD on 09/29/2020 Stage prefix: Initial diagnosis Total positive nodes: 0    Pancreatic cancer ([place])  09/18/2020 Imaging   US Abdomen  IMPRESSION: Intrahepatic and extrahepatic biliary ductal dilatation. Common bile duct dilated up to 13 mm. This could be further evaluated with MRCP or ERCP.   Sludge within the gallbladder with associated gallbladder wall thickening and pericholecystic fluid. Cannot exclude acute cholecystitis.   Multiple hepatic cysts. Complex cystic area posteriorly in the right hepatic lobe. This could also be further evaluated with MRI.   09/19/2020 Procedure   EUS and ERCP by Dr Benson Norway   EUS IMPRESSION - A mass was identified in the pancreatic head. The staging applies if malignancy is confirmed. Fine needle aspiration performed. - There was dilation in the common hepatic duct which measured up to 13 mm. - There was dilation in the intrahepatic bile ducts, diffusely. - A cyst was found in the left lobe of the liver and measured 24 mm by 15 mm. - Endosonographic images of the left adrenal gland were unremarkable.  ERCP IMPRESSION - The major papilla appeared normal. - A biliary sphincterotomy was performed. - Cells for cytology obtained distal CBD. - One covered metal stent was placed into the common bile  duct.     09/19/2020 Pathology Results   A. PANCREAS, HEAD, FINE NEEDLE ASPIRATION:  - Benign reactive/reparative changes  - Bland spindle cell fragments   DIAGNOSTIC COMMENTS:  There are fragments of bland spindle cells, favored to represent smooth muscle tissue. The glandular fragments are also bland and thus, there is no definitive malignancy identified   B. BILIARY, STRICTURE, BRUSHING:  - Benign reactive/reparative changes   09/20/2020 Imaging   MRI Abdomen  IMPRESSION: 1. 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 Recommend ERCP/EUS or tissue sampling. 2. There is mild biliary ductal dilatation of the common hepatic bile duct and the intrahepatic bile ducts in the LEFT hepatic lobe. Sequences are severely degraded by patient motion. Concern for obstruction of the common bile duct by the pancreatic head mass. 3. Multiple cystic lesions throughout the liver parenchyma. Assessment of enhancement is difficult due to patient motion as well as no true noncontrast T1 weighted imaging as described above. Favor complex benign cysts but consider liver protocol contrast CT for further evaluation of the cystic lesions to exclude metastasis.   09/21/2020 Imaging   CT CAP  IMPRESSION: 1. Heterogeneous mass of the central pancreatic head measuring 4.5 x 3.3 cm. There is atrophy of the distal pancreatic parenchyma with diffuse pancreatic ductal dilatation up to 0.8 cm. Mass appears to closely abut the lateral surface of the portal vein near the confluence. The superior mesenteric artery is well separated by a fat plane, as is the hepatic artery. 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 The exact borders and size of the mass are  better delineated by prior MR. 2. No evidence of metastatic disease in the chest, abdomen, or pelvis. 3. There are numerous lobulated, fluid attenuating cysts  throughout the hepatic parenchyma, numerous additional subcentimeter lesions are too small to characterize although demonstrate no evidence of contrast enhancement and are likely additional subcentimeter cysts. Attention on follow-up. 4. Status post common bile duct stent with post stenting pneumobilia. 5. Thickening and fat stranding about the gallbladder as well as about the adjacent duodenal bulb and descending duodenum, of uncertain etiology, concerning for cholecystitis, enteritis, and/or pancreatitis, possibly related to recent endoscopic procedure. Aortic Atherosclerosis (ICD10-I70.0).   09/26/2020 Cancer Staging   Staging form: Exocrine Pancreas, AJCC 8th Edition - Clinical stage from 09/26/2020: Stage IIA (cT3, cN0, cM0) - Signed by [name], MD on 09/29/2020 Stage prefix: Initial diagnosis Total positive nodes: 0   09/26/2020 Procedure   EUS by Dr Benson Norway  IMPRESSION - A mass was identified in the pancreatic head. Cytology results are pending. However, the endosonographic appearance is consistent with adenocarcinoma. Fine needle aspiration performed.   09/26/2020 Initial Biopsy   A. PANCREAS, HEAD, FINE NEEDLE ASPIRATION:  - Malignant cells consistent with adenocarcinoma    09/29/2020 Initial Diagnosis   Pancreatic cancer ([place])   10/07/2020 -  Chemotherapy   First-line Gemcitabine and Abraxane 2 weeks on/1 week off starting 10/07/20.  C1 given with Gemcitabine alone.     Genetic Testing   Negative genetic testing. No pathogenic variants identified on the [facility] CancerNext-Expanded+RNA panel. The report date is 10/16/2020.  The CancerNext-Expanded + RNAinsight gene panel offered by [employer] and includes sequencing and rearrangement analysis for the following 77 genes: IP, ALK, APC*, ATM*, AXIN2, BAP1, BARD1, BLM, BMPR1A, BRCA1*, BRCA2*, BRIP1*, CDC73, CDH1*,CDK4, CDKN1B, CDKN2A, CHEK2*, CTNNA1, DICER1, FANCC, FH, FLCN, GALNT12, KIF1B, LZTR1, MAX, MEN1, MET, MLH1*, MSH2*, MSH3,  MSH6*, MUTYH*, NBN, NF1*, NF2, NTHL1, PALB2*, PHOX2B, PMS2*, POT1, PRKAR1A, PTCH1, PTEN*, RAD51C*, RAD51D*,RB1, RECQL, RET, SDHA, SDHAF2, SDHB, SDHC, SDHD, SMAD4, SMARCA4, SMARCB1, SMARCE1, STK11, SUFU, TMEM127, TP53*,TSC1, TSC2, VHL and XRCC2 (sequencing and deletion/duplication); EGFR, EGLN1, HOXB13, KIT, MITF, PDGFRA, POLD1 and POLE (sequencing only); EPCAM and GREM1 (deletion/duplication only).      CURRENT THERAPY:  First-line Gemcitabine and Abraxane 2 weeks on/1 week off starting 10/07/20. C1 given with Gemcitabine alone.  INTERVAL HISTORY:  Tina Patel is here for a follow up of pancreatic cancer. She was last seen by me on 11/24/20. She presents to the clinic accompanied by husband. She reports the pain at her port site has resolved. Her port is still covered today. They do not have any Emla cream. She denies fatigue or nausea from chemo. She reports feeling well overall. They asked about receiving the next Covid booster. I advised them to wait until her off week. Her husband reports she is eating better now. He notes an intermittent cough, mainly at night. He reports she continues to do things around the house and has more energy now.   All other systems were reviewed with the patient and are negative.  MEDICAL HISTORY:  Past Medical History:  Diagnosis Date   Diabetes ([place])    Diabetes mellitus    Family history of colon cancer    Family history of lung cancer    Family history of multiple myeloma    Family history of prostate cancer    High cholesterol    Hypertension    Hypothyroid     SURGICAL HISTORY: Past Surgical History:  Procedure Laterality Date   BILIARY  BRUSHING  09/19/2020   Procedure: BILIARY BRUSHING;  Surgeon: [name], MD;  Location: [facility];  Service: Endoscopy;;   BILIARY STENT PLACEMENT N/A 09/19/2020   Procedure: BILIARY STENT PLACEMENT;  Surgeon: [name], MD;  Location: [facility] END[facility];  Service: Endoscopy;  Laterality: N/A;   ERCP N/A  09/19/2020   Procedure: ENDOSCOPIC RETROGRADE CHOLANGIOPANCREATOGRAPHY (ERCP);  Surgeon: [name], MD;  Location: [facility];  Service: Endoscopy;  Laterality: N/A;   ESOPHAGOGASTRODUODENOSCOPY (EGD) WITH PROPOFOL N/A 09/19/2020   Procedure: ESOPHAGOGASTRODUODENOSCOPY (EGD) WITH PROPOFOL;  Surgeon: [name], MD;  Location: [facility];  Service: Endoscopy;  Laterality: N/A;   ESOPHAGOGASTRODUODENOSCOPY (EGD) WITH PROPOFOL N/A 09/26/2020   Procedure: ESOPHAGOGASTRODUODENOSCOPY (EGD) WITH PROPOFOL;  Surgeon: [name], MD;  Location: [facility];  Service: Endoscopy;  Laterality: N/A;   EUS N/A 09/19/2020   Procedure: UPPER ENDOSCOPIC ULTRASOUND (EUS) LINEAR;  Surgeon: [name], MD;  Location: [facility];  Service: Endoscopy;  Laterality: N/A;   FINE NEEDLE ASPIRATION  09/19/2020   Procedure: FINE NEEDLE ASPIRATION;  Surgeon: [name], MD;  Location: [facility];  Service: Endoscopy;;   FINE NEEDLE ASPIRATION N/A 09/26/2020   Procedure: FINE NEEDLE ASPIRATION (FNA) LINEAR;  Surgeon: [name], MD;  Location: [facility];  Service: Endoscopy;  Laterality: N/A;   FINGER SURGERY     IR IMAGING GUIDED PORT INSERTION  11/24/2020   SPHINCTEROTOMY  09/19/2020   Procedure: SPHINCTEROTOMY;  Surgeon: [name], MD;  Location: [facility];  Service: Endoscopy;;   UPPER ESOPHAGEAL ENDOSCOPIC ULTRASOUND (EUS) N/A 09/26/2020   Procedure: UPPER ESOPHAGEAL ENDOSCOPIC ULTRASOUND (EUS);  Surgeon: [name], MD;  Location: [facility];  Service: Endoscopy;  Laterality: N/A;    I have reviewed the social history and family history with the patient and they are unchanged from previous note.  ALLERGIES:  has No Known Allergies.  MEDICATIONS:  Current Outpatient Medications  Medication Sig Dispense Refill   lidocaine-prilocaine (EMLA) cream Apply 1 application topically as needed. 30 g 2   amLODipine (NORVASC) 5 MG tablet Take 5 mg by mouth daily.     blood glucose meter kit and  supplies KIT Dispense based on patient and insurance preference. Use up to four times daily as directed. 1 each 0   calcium carbonate (TUMS - DOSED IN MG ELEMENTAL CALCIUM) 500 MG chewable tablet Chew 500 mg by mouth daily as needed for indigestion or heartburn.     famotidine (PEPCID) 20 MG tablet Take 20 mg by mouth at bedtime.     furosemide (LASIX) 20 MG tablet Take 20 mg by mouth daily.     insulin glargine (LANTUS) 100 UNIT/ML Solostar Pen Inject 8 Units into the skin daily. 15 mL 0   levothyroxine (SYNTHROID) 75 MCG tablet Take 75 mcg by mouth daily before breakfast.     lisinopril (ZESTRIL) 10 MG tablet Take 10 mg by mouth daily.     metFORMIN (GLUCOPHAGE-XR) 500 MG 24 hr tablet Take 500 mg by mouth 2 (two) times daily.     mirtazapine (REMERON) 7.5 MG tablet TAKE 1 TABLET BY MOUTH AT BEDTIME. 90 tablet 1   ondansetron (ZOFRAN) 8 MG tablet Take 1 tablet (8 mg total) by mouth 2 (two) times daily as needed (Nausea or vomiting). 30 tablet 1   prochlorperazine (COMPAZINE) 10 MG tablet Take 1 tablet (10 mg total) by mouth every 6 (six) hours as needed (Nausea or vomiting). 30 tablet 1   traMADol (ULTRAM) 50 MG tablet Take 50 mg by mouth at bedtime.  Vitamin D, Ergocalciferol, 50 MCG (2000 UT) CAPS Take 2,000 Units by mouth daily.     No current facility-administered medications for this visit.   Facility-Administered Medications Ordered in Other Visits  Medication Dose Route Frequency Provider Last Rate Last Admin   sodium chloride flush (NS) 0.9 % injection 10 mL  10 mL Intracatheter PRN [name], MD   10 mL at 12/08/20 1402    PHYSICAL EXAMINATION: ECOG PERFORMANCE STATUS: 1 - Symptomatic but completely ambulatory  Vitals:   12/08/20 1022  BP: (!) 146/66  Pulse: 70  Resp: 18  Temp: 97.8 F (36.6 C)  SpO2: 100%   Wt Readings from Last 3 Encounters:  12/08/20 140 lb 1.6 oz (63.5 kg)  11/24/20 137 lb (62.1 kg)  11/24/20 139 lb (63 kg)     GENERAL:alert, no distress and  comfortable SKIN: skin color, texture, turgor are normal, no rashes or significant lesions EYES: normal, Conjunctiva are pink and non-injected, sclera clear  NECK: supple, thyroid normal size, non-tender, without nodularity LYMPH:  no palpable lymphadenopathy in the cervical, axillary LUNGS: clear to auscultation and percussion with normal breathing effort HEART: regular rate & rhythm and no murmurs and no lower extremity edema ABDOMEN:abdomen soft, non-tender and normal bowel sounds Musculoskeletal:no cyanosis of digits and no clubbing  NEURO: alert & oriented x 3 with fluent speech, no focal motor/sensory deficits  LABORATORY DATA:  I have reviewed the data as listed CBC Latest Ref Rng & Units 12/08/2020 11/24/2020 11/10/2020  WBC 4.0 - 10.5 K/uL 4.3 3.7(L) 4.9  Hemoglobin 12.0 - 15.0 g/dL 11.0(L) 10.2(L) 11.7(L)  Hematocrit 36.0 - 46.0 % 33.2(L) 31.3(L) 35.0(L)  Platelets 150 - 400 K/uL 237 222 200     CMP Latest Ref Rng & Units 12/08/2020 11/24/2020 11/10/2020  Glucose 70 - 99 mg/dL 82 273(H) 158(H)  BUN 8 - 23 mg/dL 13 14 20   Creatinine 0.44 - 1.00 mg/dL 0.80 0.79 0.80  Sodium 135 - 145 mmol/L 143 141 141  Potassium 3.5 - 5.1 mmol/L 4.2 4.2 4.2  Chloride 98 - 111 mmol/L 108 111 106  CO2 22 - 32 mmol/L 25 21(L) 25  Calcium 8.9 - 10.3 mg/dL 9.2 8.9 9.1  Total Protein 6.5 - 8.1 g/dL 6.7 6.2(L) 6.9  Total Bilirubin 0.3 - 1.2 mg/dL 0.4 0.5 0.5  Alkaline Phos 38 - 126 U/L 92 80 90  AST 15 - 41 U/L 18 17 23   ALT 0 - 44 U/L 14 15 17       RADIOGRAPHIC STUDIES: I have personally reviewed the radiological images as listed and agreed with the findings in the report. No results found.   ASSESSMENT & PLAN:  Tina Patel is a 80 y.o. female with   1. 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 Her initial EUS on 09/19/20 showed mass at head of pancreas, but biopsy was negative.  She had ERCP stent placement same day. -Her repeat EUS biopsy of the mass on 09/26/20 showed adenocarcinoma from pancreatic primary. Her CT CAP showed mass to be 4.5cm. No evidence of metastatic disease in the chest, abdomen, or pelvis. She does have multiple benign cystic lesions in the liver.  -Per GI tumor board discussion, neoadjuvant chemotherapy was recommended, and referral to a tertiary cancer center for Whipple surgery evaluation.  She established care with Dr. Mariah Milling at [facility] on 10/21/20. -I started her on Gemcitabine 2 weeks on/1 week off on  10/07/20. We added abraxane with C2 and changed treatment to every 2 weeks. We will continue her benadryl premed, which helped her itching from gemcitabine. She is tolerating chemo well  -labs reviewed, adequate to proceed with C5 gemcitabine/abraxane at same dose  -f/u in 2 weeks  -plan for restaging CT in 4 weeks   2. Obstructive Jaundice, Hyperbilirubinemia, Secondary to #1 -She was seen jaundice and elevated Tbili in early June 2022 hospitalization. -She also had RUQ pain recently. This is controlled on Tramadol. -Symptoms much improved after ERCP Stent placement on 09/19/20 with Dr Benson Norway.  -Resolved   3. Weight loss, Fatigue, Secondary to #1 -For the past 3-6 months she has had 25 pounds weight loss and more fatigued. She is back up to 141 lbs as of 11/10/20.  -Weight is stable at this level. She is eating better. -I recommend Glucerna for nutritional supplement and appetite stimulant Mirtazapine (prescribed 10/21/20).   4. 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 Genetic Testing -Given her pancreatic cancer she is eligible for genetic testing.  -She was seen by Genetics on 10/13/20. Results were negative.   6. Social Support -She has great social support from her husband 80 yo), whom she lives with, and her 3 children who live in town.     PLAN:  -proceed with C5 gemcitabine/abraxane -Labs, flush, f/u, and C6 gemcitabine  and abraxane in 2 weeks -restaging CT in 3-4 weeks   No problem-specific Assessment & Plan notes found for this encounter.   Orders Placed This Encounter  Procedures   CT Abdomen Pelvis W Contrast    Standing Status:   Future    Standing Expiration Date:   12/08/2021    Order Specific Question:   If indicated for the ordered procedure, I authorize the administration of contrast media per Radiology protocol    Answer:   Yes    Order Specific Question:   Preferred imaging location?    Answer:   [facility]    Order Specific Question:   Release to patient    Answer:   Immediate    Order Specific Question:   Is Oral Contrast requested for this exam?    Answer:   Yes, Per Radiology protocol   All questions were answered. The patient knows to call the clinic with any problems, questions or concerns. No barriers to learning was detected. The total time spent in the appointment was 30 minutes.     [name], MD 12/08/2020   I, Wilburn Mylar, am acting as scribe for [name], MD.   I have reviewed the above documentation for accuracy and completeness, and I agree with the above.

## 2020-12-17 ENCOUNTER — Encounter: Payer: Self-pay | Admitting: Hematology

## 2020-12-17 ENCOUNTER — Encounter: Payer: Self-pay | Admitting: Nurse Practitioner

## 2020-12-18 DIAGNOSIS — Z1231 Encounter for screening mammogram for malignant neoplasm of breast: Secondary | ICD-10-CM | POA: Diagnosis not present

## 2020-12-19 ENCOUNTER — Telehealth: Payer: Self-pay

## 2020-12-19 NOTE — Telephone Encounter (Signed)
This nurse messaged family in response to received message concerning overlapped CT scans asked the family request [facility] send final reports from most recent CT Scans to Dr. Burr Medico

## 2020-12-24 ENCOUNTER — Other Ambulatory Visit: Payer: Self-pay

## 2020-12-24 ENCOUNTER — Inpatient Hospital Stay: Payer: Medicare Other

## 2020-12-24 ENCOUNTER — Encounter: Payer: Self-pay | Admitting: Hematology

## 2020-12-24 ENCOUNTER — Inpatient Hospital Stay: Payer: Medicare Other | Attending: Hematology | Admitting: Hematology

## 2020-12-24 ENCOUNTER — Telehealth: Payer: Self-pay

## 2020-12-24 VITALS — BP 137/78 | HR 83 | Temp 97.8°F | Resp 18 | Wt 137.6 lb

## 2020-12-24 DIAGNOSIS — Z5111 Encounter for antineoplastic chemotherapy: Secondary | ICD-10-CM | POA: Diagnosis not present

## 2020-12-24 DIAGNOSIS — C250 Malignant neoplasm of head of pancreas: Secondary | ICD-10-CM

## 2020-12-24 DIAGNOSIS — Z95828 Presence of other vascular implants and grafts: Secondary | ICD-10-CM

## 2020-12-24 LAB — CBC WITH DIFFERENTIAL (CANCER CENTER ONLY)
Abs Immature Granulocytes: 0.02 10*3/uL (ref 0.00–0.07)
Basophils Absolute: 0 10*3/uL (ref 0.0–0.1)
Basophils Relative: 1 %
Eosinophils Absolute: 0.2 10*3/uL (ref 0.0–0.5)
Eosinophils Relative: 4 %
HCT: 30.9 % — ABNORMAL LOW (ref 36.0–46.0)
Hemoglobin: 10 g/dL — ABNORMAL LOW (ref 12.0–15.0)
Immature Granulocytes: 0 %
Lymphocytes Relative: 27 %
Lymphs Abs: 1.6 10*3/uL (ref 0.7–4.0)
MCH: 28.7 pg (ref 26.0–34.0)
MCHC: 32.4 g/dL (ref 30.0–36.0)
MCV: 88.5 fL (ref 80.0–100.0)
Monocytes Absolute: 0.6 10*3/uL (ref 0.1–1.0)
Monocytes Relative: 10 %
Neutro Abs: 3.4 10*3/uL (ref 1.7–7.7)
Neutrophils Relative %: 58 %
Platelet Count: 259 10*3/uL (ref 150–400)
RBC: 3.49 MIL/uL — ABNORMAL LOW (ref 3.87–5.11)
RDW: 15.1 % (ref 11.5–15.5)
WBC Count: 5.9 10*3/uL (ref 4.0–10.5)
nRBC: 0 % (ref 0.0–0.2)

## 2020-12-24 LAB — CMP (CANCER CENTER ONLY)
ALT: 12 U/L (ref 0–44)
AST: 21 U/L (ref 15–41)
Albumin: 3.5 g/dL (ref 3.5–5.0)
Alkaline Phosphatase: 86 U/L (ref 38–126)
Anion gap: 10 (ref 5–15)
BUN: 16 mg/dL (ref 8–23)
CO2: 25 mmol/L (ref 22–32)
Calcium: 9.3 mg/dL (ref 8.9–10.3)
Chloride: 105 mmol/L (ref 98–111)
Creatinine: 0.89 mg/dL (ref 0.44–1.00)
GFR, Estimated: >60 mL/min (ref >60)
Glucose, Bld: 125 mg/dL — ABNORMAL HIGH (ref 70–99)
Potassium: 4.2 mmol/L (ref 3.5–5.1)
Sodium: 140 mmol/L (ref 135–145)
Total Bilirubin: 0.3 mg/dL (ref 0.3–1.2)
Total Protein: 6.7 g/dL (ref 6.5–8.1)

## 2020-12-24 MED ORDER — SODIUM CHLORIDE 0.9 % IV SOLN: Freq: Once | INTRAVENOUS | Status: AC

## 2020-12-24 MED ORDER — SODIUM CHLORIDE 0.9% FLUSH
10.0000 mL | INTRAVENOUS | Status: DC | PRN
Start: 2020-12-24 — End: 2020-12-24
  Administered 2020-12-24: 10 mL

## 2020-12-24 MED ORDER — FAMOTIDINE 20 MG IN NS 100 ML IVPB
20.0000 mg | Freq: Once | INTRAVENOUS | Status: AC
  Administered 2020-12-24: 20 mg via INTRAVENOUS
  Filled 2020-12-24: qty 100

## 2020-12-24 MED ORDER — PROCHLORPERAZINE MALEATE 10 MG PO TABS
10.0000 mg | ORAL_TABLET | Freq: Once | ORAL | Status: AC
  Administered 2020-12-24: 10 mg via ORAL
  Filled 2020-12-24: qty 1

## 2020-12-24 MED ORDER — PACLITAXEL PROTEIN-BOUND CHEMO INJECTION 100 MG
100.0000 mg/m2 | Freq: Once | INTRAVENOUS | Status: AC
  Administered 2020-12-24: 175 mg via INTRAVENOUS
  Filled 2020-12-24: qty 35

## 2020-12-24 MED ORDER — HEPARIN SOD (PORK) LOCK FLUSH 100 UNIT/ML IV SOLN
500.0000 [IU] | Freq: Once | INTRAVENOUS | Status: AC | PRN
  Administered 2020-12-24: 500 [IU]

## 2020-12-24 MED ORDER — SODIUM CHLORIDE 0.9% FLUSH
10.0000 mL | INTRAVENOUS | Status: DC | PRN
  Administered 2020-12-24: 10 mL via INTRAVENOUS

## 2020-12-24 MED ORDER — SODIUM CHLORIDE 0.9 % IV SOLN
1000.0000 mg/m2 | Freq: Once | INTRAVENOUS | Status: AC
  Administered 2020-12-24: 1710 mg via INTRAVENOUS
  Filled 2020-12-24: qty 44.97

## 2020-12-24 MED ORDER — DIPHENHYDRAMINE HCL 25 MG PO CAPS
50.0000 mg | ORAL_CAPSULE | Freq: Once | ORAL | Status: AC
  Administered 2020-12-24: 50 mg via ORAL
  Filled 2020-12-24: qty 2

## 2020-12-24 NOTE — Progress Notes (Addendum)
[place]   Telephone:([phone] Fax:(336) [phone]   Clinic Follow up Note   Patient Care Team: [name], DO as PCP - General (Family Medicine) [name], RN (Inactive) as Oncology Nurse Navigator [name], MD as Consulting Physician (Oncology) [name], MD as Consulting Physician (Gastroenterology)  Date of Service:  12/24/2020  CHIEF COMPLAINT: f/u of pancreatic cancer  CURRENT THERAPY:  First-line Gemcitabine and Abraxane 2 weeks on/1 week off starting 10/07/20. C1 given with Gemcitabine alone.  ASSESSMENT & PLAN:  Tina Patel is a 80 y.o. female with   1. 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 Her initial EUS on 09/19/20 showed mass at head of pancreas, but biopsy was negative. She had ERCP stent placement same day. Baseline CA 19.9 was elevated at 84. -Her repeat EUS biopsy of the mass on 09/26/20 showed adenocarcinoma from pancreatic primary. Her CT CAP showed mass to be 4.5cm. No evidence of metastatic disease in the chest, abdomen, or pelvis. She does have multiple benign cystic lesions in the liver.  -Per GI tumor board discussion, neoadjuvant chemotherapy was recommended, and referral to a tertiary cancer center for Whipple surgery evaluation.  She established care with Dr. Mariah Milling at [facility] on 10/21/20. -I started her on Gemcitabine 2 weeks on/1 week off on 10/07/20. We added abraxane with C2 and changed treatment to every 2 weeks. We will continue her benadryl premed, which helped her itching from gemcitabine. She is tolerating chemo well  -labs reviewed, adequate to proceed with C6 gemcitabine/abraxane at same dose  -she is clinically doing well, CA19.9 has dropped significantly  -she will return for C7 on 01/05/21 -she is scheduled for restaging studies at [facility] on 01/06/21.    2. Obstructive Jaundice, Hyperbilirubinemia,  Secondary to #1 -She was sent to ED by her PCP on 09/18/20 for hyperglycemia and was found to have jaundice and elevated Tbili. -She underwent ERCP Stent placement on 09/19/20 with Dr Benson Norway.  -symptoms much improved, hyperbilirubinemia resolved   3. Weight loss, Fatigue, Secondary to #1 -Weight is stable around 135-140 lbs. She is eating better. -I recommended Glucerna for nutritional supplement and prescribed appetite stimulant Mirtazapine on 10/21/20.   4. 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 Genetic Testing -Given her pancreatic cancer she is eligible for genetic testing.  -She was seen by Genetics on 10/13/20. Results were negative.   6. Social Support -She has great social support from her husband 80 yo), whom she lives with, and her 3 children who live in town.     PLAN:  -proceed with C6 gemcitabine/abraxane today at same dose with premeds  -labs, flush, f/u, and C7 on 01/05/21 -scans at [facility] on 01/06/21  -they will cancel the lab work there and we will send our results. I contacted Dr. Mariah Milling about this    No problem-specific Assessment & Plan notes found for this encounter.   SUMMARY OF ONCOLOGIC HISTORY: Oncology History Overview Note  Cancer Staging Pancreatic cancer [facility]) Staging form: Exocrine Pancreas, AJCC 8th Edition - Clinical stage from 09/26/2020: Stage IB (cT2, cN0, cM0) - Signed by [name], MD on 09/29/2020 Stage prefix: Initial diagnosis Total positive nodes: 0    Pancreatic cancer ([place])  09/18/2020 Imaging   US Abdomen  IMPRESSION: Intrahepatic and extrahepatic biliary ductal dilatation. Common bile duct dilated up to 13 mm. This could be further evaluated with MRCP or  ERCP.   Sludge within the gallbladder with associated gallbladder wall thickening and pericholecystic fluid. Cannot exclude acute cholecystitis.   Multiple hepatic cysts. Complex cystic area posteriorly in the right hepatic lobe. This could also be further  evaluated with MRI.   09/19/2020 Procedure   EUS and ERCP by Dr Benson Norway   EUS IMPRESSION - A mass was identified in the pancreatic head. The staging applies if malignancy is confirmed. Fine needle aspiration performed. - There was dilation in the common hepatic duct which measured up to 13 mm. - There was dilation in the intrahepatic bile ducts, diffusely. - A cyst was found in the left lobe of the liver and measured 24 mm by 15 mm. - Endosonographic images of the left adrenal gland were unremarkable.  ERCP IMPRESSION - The major papilla appeared normal. - A biliary sphincterotomy was performed. - Cells for cytology obtained distal CBD. - One covered metal stent was placed into the common bile duct.     09/19/2020 Pathology Results   A. PANCREAS, HEAD, FINE NEEDLE ASPIRATION:  - Benign reactive/reparative changes  - Bland spindle cell fragments   DIAGNOSTIC COMMENTS:  There are fragments of bland spindle cells, favored to represent smooth muscle tissue. The glandular fragments are also bland and thus, there is no definitive malignancy identified   B. BILIARY, STRICTURE, BRUSHING:  - Benign reactive/reparative changes   09/20/2020 Imaging   MRI Abdomen  IMPRESSION: 1. 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 Recommend ERCP/EUS or tissue sampling. 2. There is mild biliary ductal dilatation of the common hepatic bile duct and the intrahepatic bile ducts in the LEFT hepatic lobe. Sequences are severely degraded by patient motion. Concern for obstruction of the common bile duct by the pancreatic head mass. 3. Multiple cystic lesions throughout the liver parenchyma. Assessment of enhancement is difficult due to patient motion as well as no true noncontrast T1 weighted imaging as described above. Favor complex benign cysts but consider liver protocol contrast CT for further evaluation of the  cystic lesions to exclude metastasis.   09/21/2020 Imaging   CT CAP  IMPRESSION: 1. Heterogeneous mass of the central pancreatic head measuring 4.5 x 3.3 cm. There is atrophy of the distal pancreatic parenchyma with diffuse pancreatic ductal dilatation up to 0.8 cm. Mass appears to closely abut the lateral surface of the portal vein near the confluence. The superior mesenteric artery is well separated by a fat plane, as is the hepatic artery. 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 The exact borders and size of the mass are better delineated by prior MR. 2. No evidence of metastatic disease in the chest, abdomen, or pelvis. 3. There are numerous lobulated, fluid attenuating cysts throughout the hepatic parenchyma, numerous additional subcentimeter lesions are too small to characterize although demonstrate no evidence of contrast enhancement and are likely additional subcentimeter cysts. Attention on follow-up. 4. Status post common bile duct stent with post stenting pneumobilia. 5. Thickening and fat stranding about the gallbladder as well as about the adjacent duodenal bulb and descending duodenum, of uncertain etiology, concerning for cholecystitis, enteritis, and/or pancreatitis, possibly related to recent endoscopic procedure. Aortic Atherosclerosis (ICD10-I70.0).   09/26/2020 Cancer Staging   Staging form: Exocrine Pancreas, AJCC 8th Edition - Clinical stage from 09/26/2020: Stage IIA (cT3, cN0, cM0) - Signed by [name], MD on 09/29/2020 Stage prefix: Initial diagnosis Total positive nodes: 0   09/26/2020 Procedure   EUS by  Dr Benson Norway  IMPRESSION - A mass was identified in the pancreatic head. Cytology results are pending. However, the endosonographic appearance is consistent with adenocarcinoma. Fine needle aspiration performed.   09/26/2020 Initial Biopsy   A. PANCREAS, HEAD, FINE NEEDLE ASPIRATION:  - Malignant cells consistent with adenocarcinoma    09/29/2020  Initial Diagnosis   Pancreatic cancer ([place])   10/07/2020 -  Chemotherapy   First-line Gemcitabine and Abraxane 2 weeks on/1 week off starting 10/07/20.  C1 given with Gemcitabine alone.     Genetic Testing   Negative genetic testing. No pathogenic variants identified on the [facility] CancerNext-Expanded+RNA panel. The report date is 10/16/2020.  The CancerNext-Expanded + RNAinsight gene panel offered by [employer] and includes sequencing and rearrangement analysis for the following 77 genes: IP, ALK, APC*, ATM*, AXIN2, BAP1, BARD1, BLM, BMPR1A, BRCA1*, BRCA2*, BRIP1*, CDC73, CDH1*,CDK4, CDKN1B, CDKN2A, CHEK2*, CTNNA1, DICER1, FANCC, FH, FLCN, GALNT12, KIF1B, LZTR1, MAX, MEN1, MET, MLH1*, MSH2*, MSH3, MSH6*, MUTYH*, NBN, NF1*, NF2, NTHL1, PALB2*, PHOX2B, PMS2*, POT1, PRKAR1A, PTCH1, PTEN*, RAD51C*, RAD51D*,RB1, RECQL, RET, SDHA, SDHAF2, SDHB, SDHC, SDHD, SMAD4, SMARCA4, SMARCB1, SMARCE1, STK11, SUFU, TMEM127, TP53*,TSC1, TSC2, VHL and XRCC2 (sequencing and deletion/duplication); EGFR, EGLN1, HOXB13, KIT, MITF, PDGFRA, POLD1 and POLE (sequencing only); EPCAM and GREM1 (deletion/duplication only).      INTERVAL HISTORY:  Tina Patel is here for a follow up of pancreatic cancer. She was last seen by me on 12/08/20. She presents to the clinic accompanied by her husband. She reports she has not had any problems from chemo, aside from some fatigue. Her husband reports he is checking her BG and giving her insulin daily. He notes she continues to eat well.   All other systems were reviewed with the patient and are negative.  MEDICAL HISTORY:  Past Medical History:  Diagnosis Date   Diabetes ([place])    Diabetes mellitus    Family history of colon cancer    Family history of lung cancer    Family history of multiple myeloma    Family history of prostate cancer    High cholesterol    Hypertension    Hypothyroid     SURGICAL HISTORY: Past Surgical History:  Procedure Laterality Date   BILIARY  BRUSHING  09/19/2020   Procedure: BILIARY BRUSHING;  Surgeon: [name], MD;  Location: [facility];  Service: Endoscopy;;   BILIARY STENT PLACEMENT N/A 09/19/2020   Procedure: BILIARY STENT PLACEMENT;  Surgeon: [name], MD;  Location: [facility];  Service: Endoscopy;  Laterality: N/A;   ERCP N/A 09/19/2020   Procedure: ENDOSCOPIC RETROGRADE CHOLANGIOPANCREATOGRAPHY (ERCP);  Surgeon: [name], MD;  Location: [facility];  Service: Endoscopy;  Laterality: N/A;   ESOPHAGOGASTRODUODENOSCOPY (EGD) WITH PROPOFOL N/A 09/19/2020   Procedure: ESOPHAGOGASTRODUODENOSCOPY (EGD) WITH PROPOFOL;  Surgeon: [name], MD;  Location: [facility];  Service: Endoscopy;  Laterality: N/A;   ESOPHAGOGASTRODUODENOSCOPY (EGD) WITH PROPOFOL N/A 09/26/2020   Procedure: ESOPHAGOGASTRODUODENOSCOPY (EGD) WITH PROPOFOL;  Surgeon: [name], MD;  Location: [facility];  Service: Endoscopy;  Laterality: N/A;   EUS N/A 09/19/2020   Procedure: UPPER ENDOSCOPIC ULTRASOUND (EUS) LINEAR;  Surgeon: [name], MD;  Location: [facility];  Service: Endoscopy;  Laterality: N/A;   FINE NEEDLE ASPIRATION  09/19/2020   Procedure: FINE NEEDLE ASPIRATION;  Surgeon: [name], MD;  Location: [facility];  Service: Endoscopy;;   FINE NEEDLE ASPIRATION N/A 09/26/2020   Procedure: FINE NEEDLE ASPIRATION (FNA) LINEAR;  Surgeon: [name], MD;  Location: [facility];  Service: Endoscopy;  Laterality: N/A;  FINGER SURGERY     IR IMAGING GUIDED PORT INSERTION  11/24/2020   SPHINCTEROTOMY  09/19/2020   Procedure: SPHINCTEROTOMY;  Surgeon: [name], MD;  Location: [facility];  Service: Endoscopy;;   UPPER ESOPHAGEAL ENDOSCOPIC ULTRASOUND (EUS) N/A 09/26/2020   Procedure: UPPER ESOPHAGEAL ENDOSCOPIC ULTRASOUND (EUS);  Surgeon: [name], MD;  Location: [facility];  Service: Endoscopy;  Laterality: N/A;    I have reviewed the social history and family history with the patient and they are unchanged from  previous note.  ALLERGIES:  has No Known Allergies.  MEDICATIONS:  Current Outpatient Medications  Medication Sig Dispense Refill   amLODipine (NORVASC) 5 MG tablet Take 5 mg by mouth daily.     blood glucose meter kit and supplies KIT Dispense based on patient and insurance preference. Use up to four times daily as directed. 1 each 0   calcium carbonate (TUMS - DOSED IN MG ELEMENTAL CALCIUM) 500 MG chewable tablet Chew 500 mg by mouth daily as needed for indigestion or heartburn.     famotidine (PEPCID) 20 MG tablet Take 20 mg by mouth at bedtime.     furosemide (LASIX) 20 MG tablet Take 20 mg by mouth daily.     insulin glargine (LANTUS) 100 UNIT/ML Solostar Pen Inject 8 Units into the skin daily. 15 mL 0   levothyroxine (SYNTHROID) 75 MCG tablet Take 75 mcg by mouth daily before breakfast.     lidocaine-prilocaine (EMLA) cream Apply 1 application topically as needed. 30 g 2   lisinopril (ZESTRIL) 10 MG tablet Take 10 mg by mouth daily.     metFORMIN (GLUCOPHAGE-XR) 500 MG 24 hr tablet Take 500 mg by mouth 2 (two) times daily.     mirtazapine (REMERON) 7.5 MG tablet TAKE 1 TABLET BY MOUTH AT BEDTIME. 90 tablet 1   ondansetron (ZOFRAN) 8 MG tablet Take 1 tablet (8 mg total) by mouth 2 (two) times daily as needed (Nausea or vomiting). 30 tablet 1   prochlorperazine (COMPAZINE) 10 MG tablet Take 1 tablet (10 mg total) by mouth every 6 (six) hours as needed (Nausea or vomiting). 30 tablet 1   traMADol (ULTRAM) 50 MG tablet Take 50 mg by mouth at bedtime.     Vitamin D, Ergocalciferol, 50 MCG (2000 UT) CAPS Take 2,000 Units by mouth daily.     No current facility-administered medications for this visit.   Facility-Administered Medications Ordered in Other Visits  Medication Dose Route Frequency Provider Last Rate Last Admin   sodium chloride flush (NS) 0.9 % injection 10 mL  10 mL Intracatheter PRN [name], MD   10 mL at 12/24/20 1500    PHYSICAL EXAMINATION: ECOG PERFORMANCE STATUS: 1  - Symptomatic but completely ambulatory  Vitals:   12/24/20 1148  BP: 137/78  Pulse: 83  Resp: 18  Temp: 97.8 F (36.6 C)  SpO2: 100%   Wt Readings from Last 3 Encounters:  12/24/20 137 lb 9.6 oz (62.4 kg)  12/08/20 140 lb 1.6 oz (63.5 kg)  11/24/20 137 lb (62.1 kg)     GENERAL:alert, no distress and comfortable SKIN: skin color normal, no rashes or significant lesions EYES: normal, Conjunctiva are pink and non-injected, sclera clear  NEURO: alert & oriented x 3 with fluent speech  LABORATORY DATA:  I have reviewed the data as listed CBC Latest Ref Rng & Units 12/24/2020 12/08/2020 11/24/2020  WBC 4.0 - 10.5 K/uL 5.9 4.3 3.7(L)  Hemoglobin 12.0 - 15.0 g/dL 10.0(L) 11.0(L) 10.2(L)  Hematocrit 36.0 -  46.0 % 30.9(L) 33.2(L) 31.3(L)  Platelets 150 - 400 K/uL 259 237 222     CMP Latest Ref Rng & Units 12/24/2020 12/08/2020 11/24/2020  Glucose 70 - 99 mg/dL 125(H) 82 273(H)  BUN 8 - 23 mg/dL 16 13 14   Creatinine 0.44 - 1.00 mg/dL 0.89 0.80 0.79  Sodium 135 - 145 mmol/L 140 143 141  Potassium 3.5 - 5.1 mmol/L 4.2 4.2 4.2  Chloride 98 - 111 mmol/L 105 108 111  CO2 22 - 32 mmol/L 25 25 21(L)  Calcium 8.9 - 10.3 mg/dL 9.3 9.2 8.9  Total Protein 6.5 - 8.1 g/dL 6.7 6.7 6.2(L)  Total Bilirubin 0.3 - 1.2 mg/dL 0.3 0.4 0.5  Alkaline Phos 38 - 126 U/L 86 92 80  AST 15 - 41 U/L 21 18 17   ALT 0 - 44 U/L 12 14 15       RADIOGRAPHIC STUDIES: I have personally reviewed the radiological images as listed and agreed with the findings in the report. No results found.    No orders of the defined types were placed in this encounter.  All questions were answered. The patient knows to call the clinic with any problems, questions or concerns. No barriers to learning was detected. The total time spent in the appointment was 30 minutes.     [name], MD 12/24/2020   I, Wilburn Mylar, am acting as scribe for [name], MD.   I have reviewed the above documentation for accuracy and  completeness, and I agree with the above.

## 2020-12-24 NOTE — Patient Instructions (Signed)
[facility]COLOG[facility]  Discharge Instructions: ?Thank you for choosing [facility] to provide your oncology and hematology care.  ? ?If you have a lab appointment with the Cancer Center, please go directly to the Cancer Center and check in at the registration area. ?  ?Wear comfortable clothing and clothing appropriate for easy access to any Portacath or PICC line.  ? ?We strive to give you quality time with your provider. You may need to reschedule your appointment if you arrive late (15 or more minutes).  Arriving late affects you and other patients whose appointments are after yours.  Also, if you miss three or more appointments without notifying the office, you may be dismissed from the clinic at the provider?s discretion.    ?  ?For prescription refill requests, have your pharmacy contact our office and allow 72 hours for refills to be completed.   ? ?Today you received the following chemotherapy and/or immunotherapy agents: Abraxane and Gemzar    ?  ?To help prevent nausea and vomiting after your treatment, we encourage you to take your nausea medication as directed. ? ?BELOW ARE SYMPTOMS THAT SHOULD BE REPORTED IMMEDIATELY: ?*FEVER GREATER THAN 100.4 F (38 ?C) OR HIGHER ?*CHILLS OR SWEATING ?*NAUSEA AND VOMITING THAT IS NOT CONTROLLED WITH YOUR NAUSEA MEDICATION ?*UNUSUAL SHORTNESS OF BREATH ?*UNUSUAL BRUISING OR BLEEDING ?*URINARY PROBLEMS (pain or burning when urinating, or frequent urination) ?*BOWEL PROBLEMS (unusual diarrhea, constipation, pain near the anus) ?TENDERNESS IN MOUTH AND THROAT WITH OR WITHOUT PRESENCE OF ULCERS (sore throat, sores in mouth, or a toothache) ?UNUSUAL RASH, SWELLING OR PAIN  ?UNUSUAL VAGINAL DISCHARGE OR ITCHING  ? ?Items with * indicate a potential emergency and should be followed up as soon as possible or go to the Emergency Department if any problems should occur. ? ?Please show the CHEMOTHERAPY ALERT CARD or IMMUNOTHERAPY ALERT CARD at  check-in to the Emergency Department and triage nurse. ? ?Should you have questions after your visit or need to cancel or reschedule your appointment, please contact [facility]  Dept: [phone]  and follow the prompts.  Office hours are 8:00 a.m. to 4:30 p.m. Monday - Friday. Please note that voicemails left after 4:00 p.m. may not be returned until the following business day.  We are closed weekends and major holidays. You have access to a nurse at all times for urgent questions. Please call the main number to the clinic Dept: [phone] and follow the prompts. ? ? ?For any non-urgent questions, you may also contact your provider using MyChart. We now offer e-Visits for anyone 18 and older to request care online for non-urgent symptoms. For details visit [email or web address]. ?  ?Also download the MyChart app! Go to the app store, search "MyChart", open the app, select [place], and log in with your MyChart username and password. ? ?Due to Covid, a mask is required upon entering the hospital/clinic. If you do not have a mask, one will be given to you upon arrival. For doctor visits, patients may have 1 support person aged 18 or older with them. For treatment visits, patients cannot have anyone with them due to current Covid guidelines and our immunocompromised population.  ? ?

## 2020-12-24 NOTE — Telephone Encounter (Signed)
Spoke with pt's daughter, Levada Dy to let her know lab appt for [facility] cancelled d/t lab appt at [facility]. Levada Dy verbalized thanks and understanding.

## 2020-12-25 LAB — CANCER ANTIGEN 19-9: CA 19-9: 9 U/mL (ref 0–35)

## 2021-01-01 ENCOUNTER — Ambulatory Visit (HOSPITAL_COMMUNITY): Payer: Medicare Other

## 2021-01-05 ENCOUNTER — Inpatient Hospital Stay: Payer: Medicare Other

## 2021-01-05 ENCOUNTER — Inpatient Hospital Stay: Payer: Medicare Other | Admitting: Nutrition

## 2021-01-05 ENCOUNTER — Inpatient Hospital Stay: Payer: Medicare Other | Admitting: Hematology

## 2021-01-05 ENCOUNTER — Other Ambulatory Visit: Payer: Self-pay

## 2021-01-05 VITALS — BP 144/64 | HR 75 | Temp 98.4°F | Resp 17 | Ht 66.0 in | Wt 138.4 lb

## 2021-01-05 DIAGNOSIS — C250 Malignant neoplasm of head of pancreas: Secondary | ICD-10-CM | POA: Diagnosis not present

## 2021-01-05 DIAGNOSIS — Z95828 Presence of other vascular implants and grafts: Secondary | ICD-10-CM | POA: Insufficient documentation

## 2021-01-05 DIAGNOSIS — Z5111 Encounter for antineoplastic chemotherapy: Secondary | ICD-10-CM | POA: Diagnosis not present

## 2021-01-05 LAB — CBC WITH DIFFERENTIAL (CANCER CENTER ONLY)
Abs Immature Granulocytes: 0.02 10*3/uL (ref 0.00–0.07)
Basophils Absolute: 0 10*3/uL (ref 0.0–0.1)
Basophils Relative: 0 %
Eosinophils Absolute: 0.2 10*3/uL (ref 0.0–0.5)
Eosinophils Relative: 5 %
HCT: 28.5 % — ABNORMAL LOW (ref 36.0–46.0)
Hemoglobin: 9.4 g/dL — ABNORMAL LOW (ref 12.0–15.0)
Immature Granulocytes: 0 %
Lymphocytes Relative: 29 %
Lymphs Abs: 1.4 10*3/uL (ref 0.7–4.0)
MCH: 29 pg (ref 26.0–34.0)
MCHC: 33 g/dL (ref 30.0–36.0)
MCV: 88 fL (ref 80.0–100.0)
Monocytes Absolute: 0.6 10*3/uL (ref 0.1–1.0)
Monocytes Relative: 13 %
Neutro Abs: 2.4 10*3/uL (ref 1.7–7.7)
Neutrophils Relative %: 53 %
Platelet Count: 230 10*3/uL (ref 150–400)
RBC: 3.24 MIL/uL — ABNORMAL LOW (ref 3.87–5.11)
RDW: 15.2 % (ref 11.5–15.5)
WBC Count: 4.6 10*3/uL (ref 4.0–10.5)
nRBC: 0 % (ref 0.0–0.2)

## 2021-01-05 LAB — CMP (CANCER CENTER ONLY)
ALT: 19 U/L (ref 0–44)
AST: 27 U/L (ref 15–41)
Albumin: 3.5 g/dL (ref 3.5–5.0)
Alkaline Phosphatase: 102 U/L (ref 38–126)
Anion gap: 9 (ref 5–15)
BUN: 11 mg/dL (ref 8–23)
CO2: 26 mmol/L (ref 22–32)
Calcium: 9.6 mg/dL (ref 8.9–10.3)
Chloride: 106 mmol/L (ref 98–111)
Creatinine: 0.76 mg/dL (ref 0.44–1.00)
GFR, Estimated: >60 mL/min (ref >60)
Glucose, Bld: 108 mg/dL — ABNORMAL HIGH (ref 70–99)
Potassium: 4.3 mmol/L (ref 3.5–5.1)
Sodium: 141 mmol/L (ref 135–145)
Total Bilirubin: 0.3 mg/dL (ref 0.3–1.2)
Total Protein: 6.7 g/dL (ref 6.5–8.1)

## 2021-01-05 MED ORDER — FAMOTIDINE 20 MG IN NS 100 ML IVPB
20.0000 mg | Freq: Once | INTRAVENOUS | Status: AC
  Administered 2021-01-05: 20 mg via INTRAVENOUS
  Filled 2021-01-05: qty 100

## 2021-01-05 MED ORDER — HEPARIN SOD (PORK) LOCK FLUSH 100 UNIT/ML IV SOLN
500.0000 [IU] | Freq: Once | INTRAVENOUS | Status: AC | PRN
  Administered 2021-01-05: 500 [IU]

## 2021-01-05 MED ORDER — DIPHENHYDRAMINE HCL 25 MG PO CAPS
50.0000 mg | ORAL_CAPSULE | Freq: Once | ORAL | Status: AC
  Administered 2021-01-05: 50 mg via ORAL
  Filled 2021-01-05: qty 2

## 2021-01-05 MED ORDER — PACLITAXEL PROTEIN-BOUND CHEMO INJECTION 100 MG
100.0000 mg/m2 | Freq: Once | INTRAVENOUS | Status: AC
  Administered 2021-01-05: 175 mg via INTRAVENOUS
  Filled 2021-01-05: qty 35

## 2021-01-05 MED ORDER — SODIUM CHLORIDE 0.9% FLUSH
10.0000 mL | INTRAVENOUS | Status: DC | PRN
  Administered 2021-01-05: 10 mL

## 2021-01-05 MED ORDER — SODIUM CHLORIDE 0.9 % IV SOLN: Freq: Once | INTRAVENOUS | Status: AC

## 2021-01-05 MED ORDER — TRAMADOL HCL 50 MG PO TABS: 50.0000 mg | ORAL_TABLET | Freq: Two times a day (BID) | ORAL | 0 refills | Status: DC | PRN

## 2021-01-05 MED ORDER — PROCHLORPERAZINE MALEATE 10 MG PO TABS
10.0000 mg | ORAL_TABLET | Freq: Once | ORAL | Status: AC
  Administered 2021-01-05: 10 mg via ORAL
  Filled 2021-01-05: qty 1

## 2021-01-05 MED ORDER — SODIUM CHLORIDE 0.9 % IV SOLN
1000.0000 mg/m2 | Freq: Once | INTRAVENOUS | Status: AC
  Administered 2021-01-05: 1710 mg via INTRAVENOUS
  Filled 2021-01-05: qty 44.97

## 2021-01-05 MED ORDER — SODIUM CHLORIDE 0.9% FLUSH
10.0000 mL | Freq: Once | INTRAVENOUS | Status: AC
  Administered 2021-01-05: 10 mL

## 2021-01-05 NOTE — Progress Notes (Signed)
Nutrition follow-up completed with patient during infusion for pancreas cancer. Weight improved slightly to 138.64 on September 19 from 137.6 on September 7.  She thinks she is doing well.  She tries to eat throughout the day.  Reports drinking 1 carton of an oral nutrition supplement daily.  Reports she cannot drink two because she gets too full.  She has no questions or concerns.  Nutrition diagnosis: Food and nutrition related knowledge deficit resolved.  Provided support and encouragement for patient to continue strategies for adequate calorie and protein intake for weight maintenance. Patient has contact information for questions or concerns. No follow-up scheduled at this time.  Please refer back to RD if nutrition issues arise.  **Disclaimer: This note was dictated with voice recognition software. Similar sounding words can inadvertently be transcribed and this note may contain transcription errors which may not have been corrected upon publication of note.**

## 2021-01-05 NOTE — Patient Instructions (Signed)
[facility]COL[facility]  Discharge Instructions: ?Thank you for choosing [facility] to provide your oncology and hematology care.  ? ?If you have a lab appointment with the Cancer Center, please go directly to the Cancer Center and check in at the registration area. ?  ?Wear comfortable clothing and clothing appropriate for easy access to any Portacath or PICC line.  ? ?We strive to give you quality time with your provider. You may need to reschedule your appointment if you arrive late (15 or more minutes).  Arriving late affects you and other patients whose appointments are after yours.  Also, if you miss three or more appointments without notifying the office, you may be dismissed from the clinic at the provider?s discretion.    ?  ?For prescription refill requests, have your pharmacy contact our office and allow 72 hours for refills to be completed.   ? ?Today you received the following chemotherapy and/or immunotherapy agents: Abraxane and Gemzar    ?  ?To help prevent nausea and vomiting after your treatment, we encourage you to take your nausea medication as directed. ? ?BELOW ARE SYMPTOMS THAT SHOULD BE REPORTED IMMEDIATELY: ?*FEVER GREATER THAN 100.4 F (38 ?C) OR HIGHER ?*CHILLS OR SWEATING ?*NAUSEA AND VOMITING THAT IS NOT CONTROLLED WITH YOUR NAUSEA MEDICATION ?*UNUSUAL SHORTNESS OF BREATH ?*UNUSUAL BRUISING OR BLEEDING ?*URINARY PROBLEMS (pain or burning when urinating, or frequent urination) ?*BOWEL PROBLEMS (unusual diarrhea, constipation, pain near the anus) ?TENDERNESS IN MOUTH AND THROAT WITH OR WITHOUT PRESENCE OF ULCERS (sore throat, sores in mouth, or a toothache) ?UNUSUAL RASH, SWELLING OR PAIN  ?UNUSUAL VAGINAL DISCHARGE OR ITCHING  ? ?Items with * indicate a potential emergency and should be followed up as soon as possible or go to the Emergency Department if any problems should occur. ? ?Please show the CHEMOTHERAPY ALERT CARD or IMMUNOTHERAPY ALERT CARD at  check-in to the Emergency Department and triage nurse. ? ?Should you have questions after your visit or need to cancel or reschedule your appointment, please contact [facility]  Dept: [phone]  and follow the prompts.  Office hours are 8:00 a.m. to 4:30 p.m. Monday - Friday. Please note that voicemails left after 4:00 p.m. may not be returned until the following business day.  We are closed weekends and major holidays. You have access to a nurse at all times for urgent questions. Please call the main number to the clinic Dept: [phone] and follow the prompts. ? ? ?For any non-urgent questions, you may also contact your provider using MyChart. We now offer e-Visits for anyone 18 and older to request care online for non-urgent symptoms. For details visit [email or web address]. ?  ?Also download the MyChart app! Go to the app store, search "MyChart", open the app, select [place], and log in with your MyChart username and password. ? ?Due to Covid, a mask is required upon entering the hospital/clinic. If you do not have a mask, one will be given to you upon arrival. For doctor visits, patients may have 1 support person aged 18 or older with them. For treatment visits, patients cannot have anyone with them due to current Covid guidelines and our immunocompromised population.  ? ?

## 2021-01-05 NOTE — Progress Notes (Signed)
[facility]   [phone]) [phone] Fax:([phone]) [phone]   Clinic Follow up Note   Patient Care Team: Tina Morning, DO as PCP - General (Family Medicine) [name], RN (Inactive) as Oncology Nurse Navigator [name], MD as Consulting Physician (Oncology) [name], MD as Consulting Physician (Gastroenterology)  Date of Service:  01/05/2021  CHIEF COMPLAINT: f/u of pancreatic cancer  CURRENT THERAPY:  First-line Gemcitabine and Abraxane 2 weeks on/1 week off starting 10/07/20. C1 given with Gemcitabine alone.  ASSESSMENT & PLAN:  Tina Patel is a 80 y.o. female with   1. 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 Her initial EUS on 09/19/20 showed mass at head of pancreas, but biopsy was negative. She had ERCP stent placement same day. Baseline CA 19.9 was elevated at 84. -Her repeat EUS biopsy of the mass on 09/26/20 showed adenocarcinoma from pancreatic primary. Her CT CAP showed mass to be 4.5cm. No evidence of metastatic disease in the chest, abdomen, or pelvis. She does have multiple benign cystic lesions in the liver.  -Per GI tumor board discussion, neoadjuvant chemotherapy was recommended, and referral to a tertiary cancer center for Whipple surgery evaluation.  She established care with Dr. Mariah Milling at [facility] on 10/21/20. -I started her on Gemcitabine 2 weeks on/1 week off on 10/07/20. We added abraxane with C2 and changed treatment to every 2 weeks. We will continue her benadryl premed, which helped her itching from gemcitabine. She is tolerating chemo well  -labs reviewed, adequate to proceed with C7 gemcitabine/abraxane at same dose  -she is scheduled for restaging CT studies at [facility] tomorrow, 01/06/21, to determine her surgery    2. Obstructive Jaundice, Hyperbilirubinemia, Secondary to #1 -She was sent to ED by her PCP on 09/18/20 for  hyperglycemia and was found to have jaundice and elevated Tbili. -She underwent ERCP Stent placement on 09/19/20 with Dr Benson Norway.  -symptoms much improved, hyperbilirubinemia resolved   3. Weight loss, Fatigue, Secondary to #1 -Weight is stable around 135-140 lbs. She is eating better. -I recommended Glucerna for nutritional supplement and prescribed appetite stimulant Mirtazapine on 10/21/20.   4. 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 Genetic Testing -Given her pancreatic cancer she is eligible for genetic testing.  -She was seen by Genetics on 10/13/20. Results were negative.   6. Social Support -She has great social support from her husband 80 yo), whom she lives with, and her 3 children who live in town.     PLAN:  -proceed with C7 gemcitabine/abraxane today at same dose with premeds  -scans at [facility] on 01/06/21  -labs, flush, f/u, and C8 on 01/19/21   No problem-specific Assessment & Plan notes found for this encounter.   SUMMARY OF ONCOLOGIC HISTORY: Oncology History Overview Note  Cancer Staging Pancreatic cancer [facility]) Staging form: Exocrine Pancreas, AJCC 8th Edition - Clinical stage from 09/26/2020: Stage IB (cT2, cN0, cM0) - Signed by [name], MD on 09/29/2020 Stage prefix: Initial diagnosis Total positive nodes: 0    Pancreatic cancer ([place])  09/18/2020 Imaging   US Abdomen  IMPRESSION: Intrahepatic and extrahepatic biliary ductal dilatation. Common bile duct dilated up to 13 mm. This could be further evaluated with MRCP or ERCP.   Sludge within the gallbladder with associated gallbladder wall thickening and pericholecystic fluid. Cannot exclude acute cholecystitis.   Multiple hepatic cysts. Complex cystic area posteriorly in the right hepatic  lobe. This could also be further evaluated with MRI.   09/19/2020 Procedure   EUS and ERCP by Dr Benson Norway   EUS IMPRESSION - A mass was identified in the pancreatic head. The staging applies if malignancy  is confirmed. Fine needle aspiration performed. - There was dilation in the common hepatic duct which measured up to 13 mm. - There was dilation in the intrahepatic bile ducts, diffusely. - A cyst was found in the left lobe of the liver and measured 24 mm by 15 mm. - Endosonographic images of the left adrenal gland were unremarkable.  ERCP IMPRESSION - The major papilla appeared normal. - A biliary sphincterotomy was performed. - Cells for cytology obtained distal CBD. - One covered metal stent was placed into the common bile duct.     09/19/2020 Pathology Results   A. PANCREAS, HEAD, FINE NEEDLE ASPIRATION:  - Benign reactive/reparative changes  - Bland spindle cell fragments   DIAGNOSTIC COMMENTS:  There are fragments of bland spindle cells, favored to represent smooth muscle tissue. The glandular fragments are also bland and thus, there is no definitive malignancy identified   B. BILIARY, STRICTURE, BRUSHING:  - Benign reactive/reparative changes   09/20/2020 Imaging   MRI Abdomen  IMPRESSION: 1. 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 Recommend ERCP/EUS or tissue sampling. 2. There is mild biliary ductal dilatation of the common hepatic bile duct and the intrahepatic bile ducts in the LEFT hepatic lobe. Sequences are severely degraded by patient motion. Concern for obstruction of the common bile duct by the pancreatic head mass. 3. Multiple cystic lesions throughout the liver parenchyma. Assessment of enhancement is difficult due to patient motion as well as no true noncontrast T1 weighted imaging as described above. Favor complex benign cysts but consider liver protocol contrast CT for further evaluation of the cystic lesions to exclude metastasis.   09/21/2020 Imaging   CT CAP  IMPRESSION: 1. Heterogeneous mass of the central pancreatic head measuring 4.5 x 3.3 cm. There is  atrophy of the distal pancreatic parenchyma with diffuse pancreatic ductal dilatation up to 0.8 cm. Mass appears to closely abut the lateral surface of the portal vein near the confluence. The superior mesenteric artery is well separated by a fat plane, as is the hepatic artery. 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 The exact borders and size of the mass are better delineated by prior MR. 2. No evidence of metastatic disease in the chest, abdomen, or pelvis. 3. There are numerous lobulated, fluid attenuating cysts throughout the hepatic parenchyma, numerous additional subcentimeter lesions are too small to characterize although demonstrate no evidence of contrast enhancement and are likely additional subcentimeter cysts. Attention on follow-up. 4. Status post common bile duct stent with post stenting pneumobilia. 5. Thickening and fat stranding about the gallbladder as well as about the adjacent duodenal bulb and descending duodenum, of uncertain etiology, concerning for cholecystitis, enteritis, and/or pancreatitis, possibly related to recent endoscopic procedure. Aortic Atherosclerosis (ICD10-I70.0).   09/26/2020 Cancer Staging   Staging form: Exocrine Pancreas, AJCC 8th Edition - Clinical stage from 09/26/2020: Stage IIA (cT3, cN0, cM0) - Signed by [name], MD on 09/29/2020 Stage prefix: Initial diagnosis Total positive nodes: 0   09/26/2020 Procedure   EUS by Dr Benson Norway  IMPRESSION - A mass was identified in the pancreatic head. Cytology results are pending. However, the endosonographic appearance is consistent with adenocarcinoma. Fine needle aspiration performed.   09/26/2020  Initial Biopsy   A. PANCREAS, HEAD, FINE NEEDLE ASPIRATION:  - Malignant cells consistent with adenocarcinoma    09/29/2020 Initial Diagnosis   Pancreatic cancer ([place])   10/07/2020 -  Chemotherapy   First-line Gemcitabine and Abraxane 2 weeks on/1 week off starting 10/07/20.  C1 given with  Gemcitabine alone.     Genetic Testing   Negative genetic testing. No pathogenic variants identified on the [facility] CancerNext-Expanded+RNA panel. The report date is 10/16/2020.  The CancerNext-Expanded + RNAinsight gene panel offered by [employer] and includes sequencing and rearrangement analysis for the following 77 genes: IP, ALK, APC*, ATM*, AXIN2, BAP1, BARD1, BLM, BMPR1A, BRCA1*, BRCA2*, BRIP1*, CDC73, CDH1*,CDK4, CDKN1B, CDKN2A, CHEK2*, CTNNA1, DICER1, FANCC, FH, FLCN, GALNT12, KIF1B, LZTR1, MAX, MEN1, MET, MLH1*, MSH2*, MSH3, MSH6*, MUTYH*, NBN, NF1*, NF2, NTHL1, PALB2*, PHOX2B, PMS2*, POT1, PRKAR1A, PTCH1, PTEN*, RAD51C*, RAD51D*,RB1, RECQL, RET, SDHA, SDHAF2, SDHB, SDHC, SDHD, SMAD4, SMARCA4, SMARCB1, SMARCE1, STK11, SUFU, TMEM127, TP53*,TSC1, TSC2, VHL and XRCC2 (sequencing and deletion/duplication); EGFR, EGLN1, HOXB13, KIT, MITF, PDGFRA, POLD1 and POLE (sequencing only); EPCAM and GREM1 (deletion/duplication only).      INTERVAL HISTORY:  RHEALYN CULLEN is here for a follow up of pancreatic cancer. She was last seen by me on 12/24/20. She presents to the clinic alone. She notes her husband had his own appointment to be at. She reports she had a runny nose with some blood this past week. She notes this was not bad. She notes she is only using her pain medicine as needed, not every day.   All other systems were reviewed with the patient and are negative.  MEDICAL HISTORY:  Past Medical History:  Diagnosis Date   Diabetes ([place])    Diabetes mellitus    Family history of colon cancer    Family history of lung cancer    Family history of multiple myeloma    Family history of prostate cancer    High cholesterol    Hypertension    Hypothyroid     SURGICAL HISTORY: Past Surgical History:  Procedure Laterality Date   BILIARY BRUSHING  09/19/2020   Procedure: BILIARY BRUSHING;  Surgeon: [name], MD;  Location: [facility] ENDOSCOPY;  Service: Endoscopy;;   BILIARY STENT PLACEMENT N/A  09/19/2020   Procedure: BILIARY STENT PLACEMENT;  Surgeon: [name], MD;  Location: [facility];  Service: Endoscopy;  Laterality: N/A;   ERCP N/A 09/19/2020   Procedure: ENDOSCOPIC RETROGRADE CHOLANGIOPANCREATOGRAPHY (ERCP);  Surgeon: [name], MD;  Location: [facility];  Service: Endoscopy;  Laterality: N/A;   ESOPHAGOGASTRODUODENOSCOPY (EGD) WITH PROPOFOL N/A 09/19/2020   Procedure: ESOPHAGOGASTRODUODENOSCOPY (EGD) WITH PROPOFOL;  Surgeon: [name], MD;  Location: [facility];  Service: Endoscopy;  Laterality: N/A;   ESOPHAGOGASTRODUODENOSCOPY (EGD) WITH PROPOFOL N/A 09/26/2020   Procedure: ESOPHAGOGASTRODUODENOSCOPY (EGD) WITH PROPOFOL;  Surgeon: [name], MD;  Location: [facility];  Service: Endoscopy;  Laterality: N/A;   EUS N/A 09/19/2020   Procedure: UPPER ENDOSCOPIC ULTRASOUND (EUS) LINEAR;  Surgeon: [name], MD;  Location: [facility];  Service: Endoscopy;  Laterality: N/A;   FINE NEEDLE ASPIRATION  09/19/2020   Procedure: FINE NEEDLE ASPIRATION;  Surgeon: [name], MD;  Location: [facility];  Service: Endoscopy;;   FINE NEEDLE ASPIRATION N/A 09/26/2020   Procedure: FINE NEEDLE ASPIRATION (FNA) LINEAR;  Surgeon: [name], MD;  Location: [facility];  Service: Endoscopy;  Laterality: N/A;   FINGER SURGERY     IR IMAGING GUIDED PORT INSERTION  11/24/2020   SPHINCTEROTOMY  09/19/2020   Procedure: SPHINCTEROTOMY;  Surgeon: [name], MD;  Location: [facility];  Service: Endoscopy;;   UPPER ESOPHAGEAL ENDOSCOPIC ULTRASOUND (EUS) N/A 09/26/2020   Procedure: UPPER ESOPHAGEAL ENDOSCOPIC ULTRASOUND (EUS);  Surgeon: [name], MD;  Location: [facility];  Service: Endoscopy;  Laterality: N/A;    I have reviewed the social history and family history with the patient and they are unchanged from previous note.  ALLERGIES:  has No Known Allergies.  MEDICATIONS:  Current Outpatient Medications  Medication Sig Dispense Refill   amLODipine (NORVASC) 5 MG  tablet Take 5 mg by mouth daily.     blood glucose meter kit and supplies KIT Dispense based on patient and insurance preference. Use up to four times daily as directed. 1 each 0   calcium carbonate (TUMS - DOSED IN MG ELEMENTAL CALCIUM) 500 MG chewable tablet Chew 500 mg by mouth daily as needed for indigestion or heartburn.     famotidine (PEPCID) 20 MG tablet Take 20 mg by mouth at bedtime.     furosemide (LASIX) 20 MG tablet Take 20 mg by mouth daily.     insulin glargine (LANTUS) 100 UNIT/ML Solostar Pen Inject 8 Units into the skin daily. 15 mL 0   levothyroxine (SYNTHROID) 75 MCG tablet Take 75 mcg by mouth daily before breakfast.     lidocaine-prilocaine (EMLA) cream Apply 1 application topically as needed. 30 g 2   lisinopril (ZESTRIL) 10 MG tablet Take 10 mg by mouth daily.     metFORMIN (GLUCOPHAGE-XR) 500 MG 24 hr tablet Take 500 mg by mouth 2 (two) times daily.     mirtazapine (REMERON) 7.5 MG tablet TAKE 1 TABLET BY MOUTH AT BEDTIME. 90 tablet 1   ondansetron (ZOFRAN) 8 MG tablet Take 1 tablet (8 mg total) by mouth 2 (two) times daily as needed (Nausea or vomiting). 30 tablet 1   prochlorperazine (COMPAZINE) 10 MG tablet Take 1 tablet (10 mg total) by mouth every 6 (six) hours as needed (Nausea or vomiting). 30 tablet 1   traMADol (ULTRAM) 50 MG tablet Take 50 mg by mouth at bedtime.     Vitamin D, Ergocalciferol, 50 MCG (2000 UT) CAPS Take 2,000 Units by mouth daily.     No current facility-administered medications for this visit.    PHYSICAL EXAMINATION: ECOG PERFORMANCE STATUS: 1 - Symptomatic but completely ambulatory  There were no vitals filed for this visit. Wt Readings from Last 3 Encounters:  12/24/20 137 lb 9.6 oz (62.4 kg)  12/08/20 140 lb 1.6 oz (63.5 kg)  11/24/20 137 lb (62.1 kg)    GENERAL:alert, no distress and comfortable SKIN: skin color normal, no rashes or significant lesions EYES: normal, Conjunctiva are pink and non-injected, sclera clear  NEURO:  alert & oriented x 3 with fluent speech  LABORATORY DATA:  I have reviewed the data as listed CBC Latest Ref Rng & Units 01/05/2021 12/24/2020 12/08/2020  WBC 4.0 - 10.5 K/uL 4.6 5.9 4.3  Hemoglobin 12.0 - 15.0 g/dL 9.4(L) 10.0(L) 11.0(L)  Hematocrit 36.0 - 46.0 % 28.5(L) 30.9(L) 33.2(L)  Platelets 150 - 400 K/uL 230 259 237     CMP Latest Ref Rng & Units 12/24/2020 12/08/2020 11/24/2020  Glucose 70 - 99 mg/dL 125(H) 82 273(H)  BUN 8 - 23 mg/dL 16 13 14   Creatinine 0.44 - 1.00 mg/dL 0.89 0.80 0.79  Sodium 135 - 145 mmol/L 140 143 141  Potassium 3.5 - 5.1 mmol/L 4.2 4.2 4.2  Chloride 98 - 111 mmol/L 105 108 111  CO2 22 -  32 mmol/L 25 25 21(L)  Calcium 8.9 - 10.3 mg/dL 9.3 9.2 8.9  Total Protein 6.5 - 8.1 g/dL 6.7 6.7 6.2(L)  Total Bilirubin 0.3 - 1.2 mg/dL 0.3 0.4 0.5  Alkaline Phos 38 - 126 U/L 86 92 80  AST 15 - 41 U/L 21 18 17   ALT 0 - 44 U/L 12 14 15       RADIOGRAPHIC STUDIES: I have personally reviewed the radiological images as listed and agreed with the findings in the report. No results found.    No orders of the defined types were placed in this encounter.  All questions were answered. The patient knows to call the clinic with any problems, questions or concerns. No barriers to learning was detected. The total time spent in the appointment was 30 minutes.     [name], MD 01/05/2021   I, Wilburn Mylar, am acting as scribe for [name], MD.   I have reviewed the above documentation for accuracy and completeness, and I agree with the above.

## 2021-01-06 ENCOUNTER — Encounter: Payer: Self-pay | Admitting: Hematology

## 2021-01-06 DIAGNOSIS — K769 Liver disease, unspecified: Secondary | ICD-10-CM | POA: Diagnosis not present

## 2021-01-06 DIAGNOSIS — K219 Gastro-esophageal reflux disease without esophagitis: Secondary | ICD-10-CM | POA: Diagnosis not present

## 2021-01-06 DIAGNOSIS — I10 Essential (primary) hypertension: Secondary | ICD-10-CM | POA: Diagnosis not present

## 2021-01-06 DIAGNOSIS — E119 Type 2 diabetes mellitus without complications: Secondary | ICD-10-CM | POA: Diagnosis not present

## 2021-01-06 DIAGNOSIS — C259 Malignant neoplasm of pancreas, unspecified: Secondary | ICD-10-CM | POA: Diagnosis not present

## 2021-01-06 DIAGNOSIS — C250 Malignant neoplasm of head of pancreas: Secondary | ICD-10-CM | POA: Diagnosis not present

## 2021-01-06 DIAGNOSIS — E039 Hypothyroidism, unspecified: Secondary | ICD-10-CM | POA: Diagnosis not present

## 2021-01-18 NOTE — Progress Notes (Addendum)
[facility]   Telephone:([phone] Fax:([phone]) [phone]   Clinic Follow up Note   Patient Care Team: [name], DO as PCP - General (Family Medicine) [name], RN (Inactive) as Oncology Nurse Navigator [name], MD as Consulting Physician (Oncology) [name], MD as Consulting Physician (Gastroenterology) 01/19/2021  CHIEF COMPLAINT: Follow up pancreas cancer   SUMMARY OF ONCOLOGIC HISTORY: Oncology History Overview Note  Cancer Staging Pancreatic cancer [facility]) Staging form: Exocrine Pancreas, AJCC 8th Edition - Clinical stage from 09/26/2020: Stage IB (cT2, cN0, cM0) - Signed by [name], MD on 09/29/2020 Stage prefix: Initial diagnosis Total positive nodes: 0    Pancreatic cancer ([place])  09/18/2020 Imaging   US Abdomen  IMPRESSION: Intrahepatic and extrahepatic biliary ductal dilatation. Common bile duct dilated up to 13 mm. This could be further evaluated with MRCP or ERCP.   Sludge within the gallbladder with associated gallbladder wall thickening and pericholecystic fluid. Cannot exclude acute cholecystitis.   Multiple hepatic cysts. Complex cystic area posteriorly in the right hepatic lobe. This could also be further evaluated with MRI.   09/19/2020 Procedure   EUS and ERCP by Dr Benson Norway   EUS IMPRESSION - A mass was identified in the pancreatic head. The staging applies if malignancy is confirmed. Fine needle aspiration performed. - There was dilation in the common hepatic duct which measured up to 13 mm. - There was dilation in the intrahepatic bile ducts, diffusely. - A cyst was found in the left lobe of the liver and measured 24 mm by 15 mm. - Endosonographic images of the left adrenal gland were unremarkable.  ERCP IMPRESSION - The major papilla appeared normal. - A biliary sphincterotomy was performed. - Cells for cytology obtained distal CBD. - One covered metal stent was placed into the common bile duct.     09/19/2020  Pathology Results   A. PANCREAS, HEAD, FINE NEEDLE ASPIRATION:  - Benign reactive/reparative changes  - Bland spindle cell fragments   DIAGNOSTIC COMMENTS:  There are fragments of bland spindle cells, favored to represent smooth muscle tissue. The glandular fragments are also bland and thus, there is no definitive malignancy identified   B. BILIARY, STRICTURE, BRUSHING:  - Benign reactive/reparative changes   09/20/2020 Imaging   MRI Abdomen  IMPRESSION: 1. 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 Recommend ERCP/EUS or tissue sampling. 2. There is mild biliary ductal dilatation of the common hepatic bile duct and the intrahepatic bile ducts in the LEFT hepatic lobe. Sequences are severely degraded by patient motion. Concern for obstruction of the common bile duct by the pancreatic head mass. 3. Multiple cystic lesions throughout the liver parenchyma. Assessment of enhancement is difficult due to patient motion as well as no true noncontrast T1 weighted imaging as described above. Favor complex benign cysts but consider liver protocol contrast CT for further evaluation of the cystic lesions to exclude metastasis.   09/21/2020 Imaging   CT CAP  IMPRESSION: 1. Heterogeneous mass of the central pancreatic head measuring 4.5 x 3.3 cm. There is atrophy of the distal pancreatic parenchyma with diffuse pancreatic ductal dilatation up to 0.8 cm. Mass appears to closely abut the lateral surface of the portal vein near the confluence. The superior mesenteric artery is well separated by a fat plane, as is the hepatic artery. 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 The exact borders and size of the mass are better delineated by prior  MR. 2. No evidence of metastatic disease in the chest, abdomen, or pelvis. 3. There are numerous lobulated, fluid attenuating cysts throughout the hepatic  parenchyma, numerous additional subcentimeter lesions are too small to characterize although demonstrate no evidence of contrast enhancement and are likely additional subcentimeter cysts. Attention on follow-up. 4. Status post common bile duct stent with post stenting pneumobilia. 5. Thickening and fat stranding about the gallbladder as well as about the adjacent duodenal bulb and descending duodenum, of uncertain etiology, concerning for cholecystitis, enteritis, and/or pancreatitis, possibly related to recent endoscopic procedure. Aortic Atherosclerosis (ICD10-I70.0).   09/26/2020 Cancer Staging   Staging form: Exocrine Pancreas, AJCC 8th Edition - Clinical stage from 09/26/2020: Stage IIA (cT3, cN0, cM0) - Signed by [name], MD on 09/29/2020 Stage prefix: Initial diagnosis Total positive nodes: 0   09/26/2020 Procedure   EUS by Dr Benson Norway  IMPRESSION - A mass was identified in the pancreatic head. Cytology results are pending. However, the endosonographic appearance is consistent with adenocarcinoma. Fine needle aspiration performed.   09/26/2020 Initial Biopsy   A. PANCREAS, HEAD, FINE NEEDLE ASPIRATION:  - Malignant cells consistent with adenocarcinoma    09/29/2020 Initial Diagnosis   Pancreatic cancer ([place])   10/07/2020 -  Chemotherapy   First-line Gemcitabine and Abraxane 2 weeks on/1 week off starting 10/07/20.  C1 given with Gemcitabine alone.  Abraxane added with cycle 2 and changed to every 2 weeks    Genetic Testing   Negative genetic testing. No pathogenic variants identified on the [facility] CancerNext-Expanded+RNA panel. The report date is 10/16/2020.  The CancerNext-Expanded + RNAinsight gene panel offered by [employer] and includes sequencing and rearrangement analysis for the following 77 genes: IP, ALK, APC*, ATM*, AXIN2, BAP1, BARD1, BLM, BMPR1A, BRCA1*, BRCA2*, BRIP1*, CDC73, CDH1*,CDK4, CDKN1B, CDKN2A, CHEK2*, CTNNA1, DICER1, FANCC, FH, FLCN, GALNT12, KIF1B, LZTR1, MAX,  MEN1, MET, MLH1*, MSH2*, MSH3, MSH6*, MUTYH*, NBN, NF1*, NF2, NTHL1, PALB2*, PHOX2B, PMS2*, POT1, PRKAR1A, PTCH1, PTEN*, RAD51C*, RAD51D*,RB1, RECQL, RET, SDHA, SDHAF2, SDHB, SDHC, SDHD, SMAD4, SMARCA4, SMARCB1, SMARCE1, STK11, SUFU, TMEM127, TP53*,TSC1, TSC2, VHL and XRCC2 (sequencing and deletion/duplication); EGFR, EGLN1, HOXB13, KIT, MITF, PDGFRA, POLD1 and POLE (sequencing only); EPCAM and GREM1 (deletion/duplication only).   01/06/2021 Imaging   CT CAP at [facility] Impression:  1.  Similar pancreatic head mass with unchanged associated vascular involvement and pancreatic ductal dilatation.  2.  Stable multifocal hypodense liver lesions, some of which are indeterminate. Consider MR liver with and without IV contrast for further evaluation.  3.  No evidence of new or increasing metastatic disease in the chest, abdomen or pelvis.      CURRENT THERAPY: First-line Gemcitabine and Abraxane 2 weeks on/1 week off starting 10/07/20. C1 given with Gemcitabine alone.  INTERVAL HISTORY: Ms. Moraes returns for follow up and treatment as scheduled. She was last seen 9/19 and completed cycle 7 gem/abraxane. She was seen at [facility] and underwent restaging scans on 01/06/21 which showed stability.  She tolerates treatment very well without side effects, she notes its as if she does not get chemo.  Energy and appetite are adequate, she can drive when she wants to.  Denies nausea/vomiting, new or worsening abdominal pain.  Bowels moving normally.  Denies neuropathy, fever, chills, cough, chest pain, dyspnea, or other specific complaints.   MEDICAL HISTORY:  Past Medical History:  Diagnosis Date   Diabetes ([place])    Diabetes mellitus    Family history of colon cancer    Family history of lung cancer    Family  history of multiple myeloma    Family history of prostate cancer    High cholesterol    Hypertension    Hypothyroid     SURGICAL HISTORY: Past Surgical History:  Procedure Laterality Date   BILIARY  BRUSHING  09/19/2020   Procedure: BILIARY BRUSHING;  Surgeon: [name], MD;  Location: [facility];  Service: Endoscopy;;   BILIARY STENT PLACEMENT N/A 09/19/2020   Procedure: BILIARY STENT PLACEMENT;  Surgeon: [name], MD;  Location: [facility];  Service: Endoscopy;  Laterality: N/A;   ERCP N/A 09/19/2020   Procedure: ENDOSCOPIC RETROGRADE CHOLANGIOPANCREATOGRAPHY (ERCP);  Surgeon: [name], MD;  Location: [facility];  Service: Endoscopy;  Laterality: N/A;   ESOPHAGOGASTRODUODENOSCOPY (EGD) WITH PROPOFOL N/A 09/19/2020   Procedure: ESOPHAGOGASTRODUODENOSCOPY (EGD) WITH PROPOFOL;  Surgeon: [name], MD;  Location: [facility];  Service: Endoscopy;  Laterality: N/A;   ESOPHAGOGASTRODUODENOSCOPY (EGD) WITH PROPOFOL N/A 09/26/2020   Procedure: ESOPHAGOGASTRODUODENOSCOPY (EGD) WITH PROPOFOL;  Surgeon: [name], MD;  Location: [facility];  Service: Endoscopy;  Laterality: N/A;   EUS N/A 09/19/2020   Procedure: UPPER ENDOSCOPIC ULTRASOUND (EUS) LINEAR;  Surgeon: [name], MD;  Location: [facility];  Service: Endoscopy;  Laterality: N/A;   FINE NEEDLE ASPIRATION  09/19/2020   Procedure: FINE NEEDLE ASPIRATION;  Surgeon: [name], MD;  Location: [facility];  Service: Endoscopy;;   FINE NEEDLE ASPIRATION N/A 09/26/2020   Procedure: FINE NEEDLE ASPIRATION (FNA) LINEAR;  Surgeon: [name], MD;  Location: [facility];  Service: Endoscopy;  Laterality: N/A;   FINGER SURGERY     IR IMAGING GUIDED PORT INSERTION  11/24/2020   SPHINCTEROTOMY  09/19/2020   Procedure: SPHINCTEROTOMY;  Surgeon: [name], MD;  Location: [facility];  Service: Endoscopy;;   UPPER ESOPHAGEAL ENDOSCOPIC ULTRASOUND (EUS) N/A 09/26/2020   Procedure: UPPER ESOPHAGEAL ENDOSCOPIC ULTRASOUND (EUS);  Surgeon: [name], MD;  Location: [facility];  Service: Endoscopy;  Laterality: N/A;    I have reviewed the social history and family history with the patient and they are unchanged from  previous note.  ALLERGIES:  has No Known Allergies.  MEDICATIONS:  Current Outpatient Medications  Medication Sig Dispense Refill   amLODipine (NORVASC) 5 MG tablet Take 5 mg by mouth daily.     blood glucose meter kit and supplies KIT Dispense based on patient and insurance preference. Use up to four times daily as directed. 1 each 0   calcium carbonate (TUMS - DOSED IN MG ELEMENTAL CALCIUM) 500 MG chewable tablet Chew 500 mg by mouth daily as needed for indigestion or heartburn.     famotidine (PEPCID) 20 MG tablet Take 20 mg by mouth at bedtime.     furosemide (LASIX) 20 MG tablet Take 20 mg by mouth daily.     insulin glargine (LANTUS) 100 UNIT/ML Solostar Pen Inject 8 Units into the skin daily. 15 mL 0   levothyroxine (SYNTHROID) 75 MCG tablet Take 75 mcg by mouth daily before breakfast.     lidocaine-prilocaine (EMLA) cream Apply 1 application topically as needed. 30 g 2   lisinopril (ZESTRIL) 10 MG tablet Take 10 mg by mouth daily.     metFORMIN (GLUCOPHAGE-XR) 500 MG 24 hr tablet Take 500 mg by mouth 2 (two) times daily.     mirtazapine (REMERON) 7.5 MG tablet TAKE 1 TABLET BY MOUTH AT BEDTIME. 90 tablet 1   ondansetron (ZOFRAN) 8 MG tablet Take 1 tablet (8 mg total) by mouth 2 (two) times daily as needed (Nausea or vomiting). 30 tablet 1  prochlorperazine (COMPAZINE) 10 MG tablet Take 1 tablet (10 mg total) by mouth every 6 (six) hours as needed (Nausea or vomiting). 30 tablet 1   traMADol (ULTRAM) 50 MG tablet Take 1 tablet (50 mg total) by mouth every 12 (twelve) hours as needed. 30 tablet 0   Vitamin D, Ergocalciferol, 50 MCG (2000 UT) CAPS Take 2,000 Units by mouth daily.     No current facility-administered medications for this visit.   Facility-Administered Medications Ordered in Other Visits  Medication Dose Route Frequency Provider Last Rate Last Admin   gemcitabine (GEMZAR) 1,710 mg in sodium chloride 0.9 % 250 mL chemo infusion  1,000 mg/m2 (Treatment Plan Recorded)  Intravenous Once [name], MD       heparin lock flush 100 unit/mL  500 Units Intracatheter Once PRN [name], MD       PACLitaxel-protein bound (ABRAXANE) chemo infusion 175 mg  100 mg/m2 (Treatment Plan Recorded) Intravenous Once [name], MD 70 mL/hr at 01/19/21 1315 175 mg at 01/19/21 1315   sodium chloride flush (NS) 0.9 % injection 10 mL  10 mL Intracatheter PRN [name], MD        PHYSICAL EXAMINATION: ECOG PERFORMANCE STATUS: 0 - Asymptomatic  Vitals:   01/19/21 1204  BP: 121/62  Pulse: 95  Resp: 16  Temp: 98.3 F (36.8 C)  SpO2: 100%   Filed Weights   01/19/21 1204  Weight: 136 lb 14.4 oz (62.1 kg)    GENERAL:alert, no distress and comfortable SKIN: No rash EYES: sclera clear LUNGS:  normal breathing effort Musculoskeletal:no cyanosis of digits and no clubbing  NEURO: alert & oriented x 3 with fluent speech, no focal motor/sensory deficits PAC without erythema  LABORATORY DATA:  I have reviewed the data as listed CBC Latest Ref Rng & Units 01/19/2021 01/05/2021 12/24/2020  WBC 4.0 - 10.5 K/uL 4.6 4.6 5.9  Hemoglobin 12.0 - 15.0 g/dL 9.3(L) 9.4(L) 10.0(L)  Hematocrit 36.0 - 46.0 % 29.7(L) 28.5(L) 30.9(L)  Platelets 150 - 400 K/uL 292 230 259     CMP Latest Ref Rng & Units 01/19/2021 01/05/2021 12/24/2020  Glucose 70 - 99 mg/dL 99 108(H) 125(H)  BUN 8 - 23 mg/dL 14 11 16   Creatinine 0.44 - 1.00 mg/dL 0.79 0.76 0.89  Sodium 135 - 145 mmol/L 140 141 140  Potassium 3.5 - 5.1 mmol/L 4.2 4.3 4.2  Chloride 98 - 111 mmol/L 105 106 105  CO2 22 - 32 mmol/L 26 26 25   Calcium 8.9 - 10.3 mg/dL 9.1 9.6 9.3  Total Protein 6.5 - 8.1 g/dL 6.9 6.7 6.7  Total Bilirubin 0.3 - 1.2 mg/dL 0.4 0.3 0.3  Alkaline Phos 38 - 126 U/L 96 102 86  AST 15 - 41 U/L 25 27 21   ALT 0 - 44 U/L 15 19 12       RADIOGRAPHIC STUDIES: I have personally reviewed the radiological images as listed and agreed with the findings in the report. No results found.   ASSESSMENT & PLAN: LATRELL REITAN is  a 80 y.o.     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 Her initial EUS on 09/19/20 showed mass at head of pancreas, but biopsy was negative. She had ERCP stent placement same day. -Her repeat EUS biopsy of the mass on 09/26/20 showed adenocarcinoma from pancreatic primary. Her CT CAP showed mass to be 4.5cm. No evidence of metastatic disease in the chest, abdomen, or  pelvis. She does have multiple benign cystic lesions in the liver.   -Per GI tumor board discussion, neoadjuvant chemotherapy was recommended and she was referred to Dr. Carmelina Noun [facility] for surgical consideration.   -She began gemcitabine 2 weeks on/1 week off on 10/07/2020, Abraxane was added with cycle 2 and changed to every 2 weeks.  She has been tolerating very well with relatively no side effects  -Restaging CT 01/06/[facility] at [facility] showed similar pancreatic head mass with unchanged associated vascular involvement and stable liver lesions which remain indeterminate.  No evidence of progression.  Per the family, doctors Zani recommended an additional 3 months of chemo, the note is not available. -Given her good tolerance and high performance status, we discussed increasing the intensity to gem/Abraxane 2 weeks on/1 week off, patient and family agree  2. Obstructive Jaundice, Hyperbilirubinemia, Secondary to #1 -She had jaundice and elevated Tbili in early June 2022 during hospitalization, with RUQ pain controlled on tramadol  -CA 19-9 was elevated to 84 on 09/19/2020 when she was obstructed, normalized after stenting -S/p ERCP and stent placement 09/19/2020 with Dr. Benson Norway, symptoms much improved and jaundice resolved (10/06/2020)   3. Weight loss, Fatigue, Secondary to #1 -For the past 3-6 months she has had 25 pounds weight loss and more fatigued.  -She has low appetite, nausea and mild early satiety. She forces herself to remain  active at home. -Continue mirtazapine and supplements -Follow-up with dietitian -Stable   4. 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 Genetic Testing -Given her pancreatic cancer she is eligible for genetic testing. She is interested.    6. Social Support -She has great social support from her husband (60) yo) who she lives with and her 3 children who live in town.  -I spoke to her daughter Levada Dy today  Disposition: Ms. Salvi appears stable.  She completed 7 cycles of neoadjuvant gemcitabine/Abraxane every 2 weeks.  She tolerates treatment very well with relatively no side effects.  She remains able to function well with good performance status at home.  There is no clinical evidence of disease progression.  She was seen at [facility] with restaging, CT shows stable pancreatic head mass and vascular involvement.  We have requested Dr. Alwyn Pea note when it becomes available.  Per the family, he recommends an additional 3 months of chemo.  We discussed increasing the intensity to gem/Abraxane 2 weeks on and 1 week off.  The patient and family agree.  Labs adequate to proceed today.  She will return next week for lab and chemo, then follow-up in 3 weeks with next cycle.  Patient seen by Dr. Annamaria Boots.  All questions were answered. The patient knows to call the clinic with any problems, questions or concerns. No barriers to learning were detected.     Alla Feeling, NP 01/19/21    Addendum  I have seen the patient, examined her. I agree with the assessment and and plan and have edited the notes.   I have reviewed her CT scan from 01/06/2021 which was done at [facility], which showed similar size pancreatic mass, with less than 180 degree abutment of the SMV. Her liver lesions are previously evaluated by MRI which felt to be benign. She was seen by Dr. Mariah Milling and additional 3 months neoadjuvant chemotherapy was recommended, and additional neoadjuvant radiation may be needed before  surgery.  Patient is tolerating gemcitabine and Abraxane every 2 weeks very well, given the suboptimal response, I recommend change gemcitabine and  Abraxane to day 1 and 8 every 21 days, I do not feels she can tolerate FOLFIRINOX.  Patient agrees with the plan.  We will start from this cycle. All questions were answered.   [name]  01/19/2021

## 2021-01-19 ENCOUNTER — Other Ambulatory Visit: Payer: Self-pay

## 2021-01-19 ENCOUNTER — Telehealth: Payer: Self-pay

## 2021-01-19 ENCOUNTER — Inpatient Hospital Stay: Payer: Medicare Other | Attending: Hematology | Admitting: Nurse Practitioner

## 2021-01-19 ENCOUNTER — Inpatient Hospital Stay: Payer: Medicare Other

## 2021-01-19 ENCOUNTER — Encounter: Payer: Self-pay | Admitting: Nurse Practitioner

## 2021-01-19 VITALS — BP 121/62 | HR 95 | Temp 98.3°F | Resp 16 | Ht 66.0 in | Wt 136.9 lb

## 2021-01-19 VITALS — BP 123/68 | HR 87 | Temp 98.5°F | Resp 18 | Wt 136.5 lb

## 2021-01-19 DIAGNOSIS — Z5111 Encounter for antineoplastic chemotherapy: Secondary | ICD-10-CM | POA: Insufficient documentation

## 2021-01-19 DIAGNOSIS — C250 Malignant neoplasm of head of pancreas: Secondary | ICD-10-CM | POA: Diagnosis not present

## 2021-01-19 DIAGNOSIS — R051 Acute cough: Secondary | ICD-10-CM | POA: Insufficient documentation

## 2021-01-19 LAB — CBC WITH DIFFERENTIAL (CANCER CENTER ONLY)
Abs Immature Granulocytes: 0.02 K/uL (ref 0.00–0.07)
Basophils Absolute: 0 K/uL (ref 0.0–0.1)
Basophils Relative: 1 %
Eosinophils Absolute: 0.4 K/uL (ref 0.0–0.5)
Eosinophils Relative: 8 %
HCT: 29.7 % — ABNORMAL LOW (ref 36.0–46.0)
Hemoglobin: 9.3 g/dL — ABNORMAL LOW (ref 12.0–15.0)
Immature Granulocytes: 0 %
Lymphocytes Relative: 26 %
Lymphs Abs: 1.2 K/uL (ref 0.7–4.0)
MCH: 27.7 pg (ref 26.0–34.0)
MCHC: 31.3 g/dL (ref 30.0–36.0)
MCV: 88.4 fL (ref 80.0–100.0)
Monocytes Absolute: 0.6 K/uL (ref 0.1–1.0)
Monocytes Relative: 13 %
Neutro Abs: 2.4 K/uL (ref 1.7–7.7)
Neutrophils Relative %: 52 %
Platelet Count: 292 K/uL (ref 150–400)
RBC: 3.36 MIL/uL — ABNORMAL LOW (ref 3.87–5.11)
RDW: 14.8 % (ref 11.5–15.5)
WBC Count: 4.6 K/uL (ref 4.0–10.5)
nRBC: 0 % (ref 0.0–0.2)

## 2021-01-19 LAB — CMP (CANCER CENTER ONLY)
ALT: 15 U/L (ref 0–44)
AST: 25 U/L (ref 15–41)
Albumin: 3.5 g/dL (ref 3.5–5.0)
Alkaline Phosphatase: 96 U/L (ref 38–126)
Anion gap: 9 (ref 5–15)
BUN: 14 mg/dL (ref 8–23)
CO2: 26 mmol/L (ref 22–32)
Calcium: 9.1 mg/dL (ref 8.9–10.3)
Chloride: 105 mmol/L (ref 98–111)
Creatinine: 0.79 mg/dL (ref 0.44–1.00)
GFR, Estimated: >60 mL/min
Glucose, Bld: 99 mg/dL (ref 70–99)
Potassium: 4.2 mmol/L (ref 3.5–5.1)
Sodium: 140 mmol/L (ref 135–145)
Total Bilirubin: 0.4 mg/dL (ref 0.3–1.2)
Total Protein: 6.9 g/dL (ref 6.5–8.1)

## 2021-01-19 MED ORDER — SODIUM CHLORIDE 0.9 % IV SOLN: Freq: Once | INTRAVENOUS | Status: AC

## 2021-01-19 MED ORDER — PROCHLORPERAZINE MALEATE 10 MG PO TABS
10.0000 mg | ORAL_TABLET | Freq: Once | ORAL | Status: AC
  Administered 2021-01-19: 10 mg via ORAL
  Filled 2021-01-19: qty 1

## 2021-01-19 MED ORDER — HEPARIN SOD (PORK) LOCK FLUSH 100 UNIT/ML IV SOLN
500.0000 [IU] | Freq: Once | INTRAVENOUS | Status: AC | PRN
  Administered 2021-01-19: 500 [IU]

## 2021-01-19 MED ORDER — DIPHENHYDRAMINE HCL 25 MG PO CAPS
50.0000 mg | ORAL_CAPSULE | Freq: Once | ORAL | Status: AC
  Administered 2021-01-19: 50 mg via ORAL
  Filled 2021-01-19: qty 2

## 2021-01-19 MED ORDER — SODIUM CHLORIDE 0.9% FLUSH
10.0000 mL | INTRAVENOUS | Status: DC | PRN
  Administered 2021-01-19: 10 mL

## 2021-01-19 MED ORDER — FAMOTIDINE 20 MG IN NS 100 ML IVPB
20.0000 mg | Freq: Once | INTRAVENOUS | Status: AC
  Administered 2021-01-19: 20 mg via INTRAVENOUS
  Filled 2021-01-19: qty 100

## 2021-01-19 MED ORDER — SODIUM CHLORIDE 0.9 % IV SOLN
1000.0000 mg/m2 | Freq: Once | INTRAVENOUS | Status: AC
  Administered 2021-01-19: 1710 mg via INTRAVENOUS
  Filled 2021-01-19: qty 44.97

## 2021-01-19 MED ORDER — PACLITAXEL PROTEIN-BOUND CHEMO INJECTION 100 MG
100.0000 mg/m2 | Freq: Once | INTRAVENOUS | Status: AC
  Administered 2021-01-19: 175 mg via INTRAVENOUS
  Filled 2021-01-19: qty 35

## 2021-01-19 NOTE — Telephone Encounter (Signed)
Called and LVM for Dr Alwyn Pea office requesting most recent office note.

## 2021-01-19 NOTE — Patient Instructions (Signed)

## 2021-01-19 NOTE — Patient Instructions (Signed)
[facility]  Discharge Instructions: Thank you for choosing [facility] to provide your oncology and hematology care.   If you have a lab appointment with the [facility], please go directly to the [facility] and check in at the registration area.   Wear comfortable clothing and clothing appropriate for easy access to any Portacath or PICC line.   We strive to give you quality time with your provider. You may need to reschedule your appointment if you arrive late (15 or more minutes).  Arriving late affects you and other patients whose appointments are after yours.  Also, if you miss three or more appointments without notifying the office, you may be dismissed from the clinic at the provider's discretion.      For prescription refill requests, have your pharmacy contact our office and allow 72 hours for refills to be completed.    Today you received the following chemotherapy and/or immunotherapy agents Paclitaxel-Protein Bound and Gemcitabine      To help prevent nausea and vomiting after your treatment, we encourage you to take your nausea medication as directed.  BELOW ARE SYMPTOMS THAT SHOULD BE REPORTED IMMEDIATELY: *FEVER GREATER THAN 100.4 F (38 C) OR HIGHER *CHILLS OR SWEATING *NAUSEA AND VOMITING THAT IS NOT CONTROLLED WITH YOUR NAUSEA MEDICATION *UNUSUAL SHORTNESS OF BREATH *UNUSUAL BRUISING OR BLEEDING *URINARY PROBLEMS (pain or burning when urinating, or frequent urination) *BOWEL PROBLEMS (unusual diarrhea, constipation, pain near the anus) TENDERNESS IN MOUTH AND THROAT WITH OR WITHOUT PRESENCE OF ULCERS (sore throat, sores in mouth, or a toothache) UNUSUAL RASH, SWELLING OR PAIN  UNUSUAL VAGINAL DISCHARGE OR ITCHING   Items with * indicate a potential emergency and should be followed up as soon as possible or go to the Emergency Department if any problems should occur.  Please show the CHEMOTHERAPY ALERT CARD or  IMMUNOTHERAPY ALERT CARD at check-in to the Emergency Department and triage nurse.  Should you have questions after your visit or need to cancel or reschedule your appointment, please contact [address]  Dept: [phone]  and follow the prompts.  Office hours are 8:00 a.m. to 4:30 p.m. Monday - Friday. Please note that voicemails left after 4:00 p.m. may not be returned until the following business day.  We are closed weekends and major holidays. You have access to a nurse at all times for urgent questions. Please call the main number to the clinic Dept: [phone] and follow the prompts.   For any non-urgent questions, you may also contact your provider using MyChart. We now offer e-Visits for anyone 73 and older to request care online for non-urgent symptoms. For details visit [email or web address].   Also download the MyChart app! Go to the app store, search "MyChart", open the app, select [facility], and log in with your MyChart username and password.  Due to Covid, a mask is required upon entering the hospital/clinic. If you do not have a mask, one will be given to you upon arrival. For doctor visits, patients may have 1 support person aged 63 or older with them. For treatment visits, patients cannot have anyone with them due to current Covid guidelines and our immunocompromised population.

## 2021-01-20 ENCOUNTER — Encounter: Payer: Self-pay | Admitting: Hematology

## 2021-01-20 ENCOUNTER — Telehealth: Payer: Self-pay | Admitting: Hematology

## 2021-01-20 LAB — CANCER ANTIGEN 19-9: CA 19-9: 6 U/mL (ref 0–35)

## 2021-01-20 NOTE — Telephone Encounter (Signed)
Unable to leave message with follow-up appointments.

## 2021-01-26 ENCOUNTER — Inpatient Hospital Stay: Payer: Medicare Other

## 2021-01-26 ENCOUNTER — Other Ambulatory Visit: Payer: Self-pay

## 2021-01-26 VITALS — BP 130/61 | HR 83 | Temp 97.7°F | Resp 16 | Wt 138.0 lb

## 2021-01-26 DIAGNOSIS — Z5111 Encounter for antineoplastic chemotherapy: Secondary | ICD-10-CM | POA: Diagnosis not present

## 2021-01-26 DIAGNOSIS — Z95828 Presence of other vascular implants and grafts: Secondary | ICD-10-CM

## 2021-01-26 DIAGNOSIS — C250 Malignant neoplasm of head of pancreas: Secondary | ICD-10-CM

## 2021-01-26 DIAGNOSIS — R051 Acute cough: Secondary | ICD-10-CM | POA: Diagnosis not present

## 2021-01-26 LAB — CBC WITH DIFFERENTIAL (CANCER CENTER ONLY)
Abs Immature Granulocytes: 0.03 10*3/uL (ref 0.00–0.07)
Basophils Absolute: 0 10*3/uL (ref 0.0–0.1)
Basophils Relative: 1 %
Eosinophils Absolute: 0.1 10*3/uL (ref 0.0–0.5)
Eosinophils Relative: 2 %
HCT: 25.9 % — ABNORMAL LOW (ref 36.0–46.0)
Hemoglobin: 8.3 g/dL — ABNORMAL LOW (ref 12.0–15.0)
Immature Granulocytes: 1 %
Lymphocytes Relative: 33 %
Lymphs Abs: 1.1 10*3/uL (ref 0.7–4.0)
MCH: 27.9 pg (ref 26.0–34.0)
MCHC: 32 g/dL (ref 30.0–36.0)
MCV: 86.9 fL (ref 80.0–100.0)
Monocytes Absolute: 0.3 10*3/uL (ref 0.1–1.0)
Monocytes Relative: 10 %
Neutro Abs: 1.7 10*3/uL (ref 1.7–7.7)
Neutrophils Relative %: 53 %
Platelet Count: 308 10*3/uL (ref 150–400)
RBC: 2.98 MIL/uL — ABNORMAL LOW (ref 3.87–5.11)
RDW: 14.6 % (ref 11.5–15.5)
WBC Count: 3.2 10*3/uL — ABNORMAL LOW (ref 4.0–10.5)
nRBC: 0 % (ref 0.0–0.2)

## 2021-01-26 LAB — CMP (CANCER CENTER ONLY)
ALT: 17 U/L (ref 0–44)
AST: 26 U/L (ref 15–41)
Albumin: 3.1 g/dL — ABNORMAL LOW (ref 3.5–5.0)
Alkaline Phosphatase: 137 U/L — ABNORMAL HIGH (ref 38–126)
Anion gap: 11 (ref 5–15)
BUN: 16 mg/dL (ref 8–23)
CO2: 23 mmol/L (ref 22–32)
Calcium: 9 mg/dL (ref 8.9–10.3)
Chloride: 105 mmol/L (ref 98–111)
Creatinine: 0.86 mg/dL (ref 0.44–1.00)
GFR, Estimated: >60 mL/min (ref >60)
Glucose, Bld: 177 mg/dL — ABNORMAL HIGH (ref 70–99)
Potassium: 4.1 mmol/L (ref 3.5–5.1)
Sodium: 139 mmol/L (ref 135–145)
Total Bilirubin: 0.3 mg/dL (ref 0.3–1.2)
Total Protein: 6.6 g/dL (ref 6.5–8.1)

## 2021-01-26 MED ORDER — SODIUM CHLORIDE 0.9% FLUSH
10.0000 mL | INTRAVENOUS | Status: DC | PRN
  Administered 2021-01-26: 10 mL

## 2021-01-26 MED ORDER — DIPHENHYDRAMINE HCL 25 MG PO CAPS
50.0000 mg | ORAL_CAPSULE | Freq: Once | ORAL | Status: AC
  Administered 2021-01-26: 50 mg via ORAL
  Filled 2021-01-26: qty 2

## 2021-01-26 MED ORDER — PACLITAXEL PROTEIN-BOUND CHEMO INJECTION 100 MG
100.0000 mg/m2 | Freq: Once | INTRAVENOUS | Status: AC
  Administered 2021-01-26: 175 mg via INTRAVENOUS
  Filled 2021-01-26: qty 35

## 2021-01-26 MED ORDER — FAMOTIDINE 20 MG IN NS 100 ML IVPB
20.0000 mg | Freq: Once | INTRAVENOUS | Status: AC
  Administered 2021-01-26: 20 mg via INTRAVENOUS
  Filled 2021-01-26: qty 100

## 2021-01-26 MED ORDER — PROCHLORPERAZINE MALEATE 10 MG PO TABS
10.0000 mg | ORAL_TABLET | Freq: Once | ORAL | Status: AC
  Administered 2021-01-26: 10 mg via ORAL
  Filled 2021-01-26: qty 1

## 2021-01-26 MED ORDER — SODIUM CHLORIDE 0.9 % IV SOLN
1000.0000 mg/m2 | Freq: Once | INTRAVENOUS | Status: AC
  Administered 2021-01-26: 1710 mg via INTRAVENOUS
  Filled 2021-01-26: qty 44.97

## 2021-01-26 MED ORDER — SODIUM CHLORIDE 0.9 % IV SOLN: Freq: Once | INTRAVENOUS | Status: AC

## 2021-01-26 MED ORDER — HEPARIN SOD (PORK) LOCK FLUSH 100 UNIT/ML IV SOLN
500.0000 [IU] | Freq: Once | INTRAVENOUS | Status: AC | PRN
  Administered 2021-01-26: 500 [IU]

## 2021-01-26 MED ORDER — SODIUM CHLORIDE 0.9% FLUSH
10.0000 mL | Freq: Once | INTRAVENOUS | Status: AC
  Administered 2021-01-26: 10 mL

## 2021-01-26 NOTE — Patient Instructions (Signed)
[facility]COL[facility]   Discharge Instructions: Thank you for choosing [facility] to provide your oncology and hematology care.   If you have a lab appointment with the [facility], please go directly to the [facility] and check in at the registration area.   Wear comfortable clothing and clothing appropriate for easy access to any Portacath or PICC line.   We strive to give you quality time with your provider. You may need to reschedule your appointment if you arrive late (15 or more minutes).  Arriving late affects you and other patients whose appointments are after yours.  Also, if you miss three or more appointments without notifying the office, you may be dismissed from the clinic at the provider's discretion.      For prescription refill requests, have your pharmacy contact our office and allow 72 hours for refills to be completed.    Today you received the following chemotherapy and/or immunotherapy agents: paclitaxel-protein bound and gemcitabine.      To help prevent nausea and vomiting after your treatment, we encourage you to take your nausea medication as directed.  BELOW ARE SYMPTOMS THAT SHOULD BE REPORTED IMMEDIATELY: *FEVER GREATER THAN 100.4 F (38 C) OR HIGHER *CHILLS OR SWEATING *NAUSEA AND VOMITING THAT IS NOT CONTROLLED WITH YOUR NAUSEA MEDICATION *UNUSUAL SHORTNESS OF BREATH *UNUSUAL BRUISING OR BLEEDING *URINARY PROBLEMS (pain or burning when urinating, or frequent urination) *BOWEL PROBLEMS (unusual diarrhea, constipation, pain near the anus) TENDERNESS IN MOUTH AND THROAT WITH OR WITHOUT PRESENCE OF ULCERS (sore throat, sores in mouth, or a toothache) UNUSUAL RASH, SWELLING OR PAIN  UNUSUAL VAGINAL DISCHARGE OR ITCHING   Items with * indicate a potential emergency and should be followed up as soon as possible or go to the Emergency Department if any problems should occur.  Please show the CHEMOTHERAPY ALERT CARD or  IMMUNOTHERAPY ALERT CARD at check-in to the Emergency Department and triage nurse.  Should you have questions after your visit or need to cancel or reschedule your appointment, please contact [facility]  Dept: [phone]  and follow the prompts.  Office hours are 8:00 a.m. to 4:30 p.m. Monday - Friday. Please note that voicemails left after 4:00 p.m. may not be returned until the following business day.  We are closed weekends and major holidays. You have access to a nurse at all times for urgent questions. Please call the main number to the clinic Dept: [phone] and follow the prompts.   For any non-urgent questions, you may also contact your provider using MyChart. We now offer e-Visits for anyone 65 and older to request care online for non-urgent symptoms. For details visit [email or web address].   Also download the MyChart app! Go to the app store, search "MyChart", open the app, select [facility], and log in with your MyChart username and password.  Due to Covid, a mask is required upon entering the hospital/clinic. If you do not have a mask, one will be given to you upon arrival. For doctor visits, patients may have 1 support person aged 7 or older with them. For treatment visits, patients cannot have anyone with them due to current Covid guidelines and our immunocompromised population.

## 2021-01-30 ENCOUNTER — Encounter: Payer: Self-pay | Admitting: Hematology

## 2021-02-02 ENCOUNTER — Other Ambulatory Visit: Payer: Self-pay | Admitting: Nurse Practitioner

## 2021-02-06 ENCOUNTER — Encounter: Payer: Self-pay | Admitting: Hematology

## 2021-02-06 NOTE — Progress Notes (Signed)
Called pt to introduce myself as her Financial Resource Specialist, discuss copay assistance and the [employer] grant.  I left a msg requesting she return my call if she's interested in applying for the grants. 

## 2021-02-09 ENCOUNTER — Other Ambulatory Visit: Payer: Self-pay

## 2021-02-09 ENCOUNTER — Inpatient Hospital Stay: Payer: Medicare Other

## 2021-02-09 ENCOUNTER — Ambulatory Visit (HOSPITAL_COMMUNITY)
Admission: RE | Admit: 2021-02-09 | Discharge: 2021-02-09 | Disposition: A | Discharge status: Home / Self Care | Payer: Medicare Other | Source: Ambulatory Visit | Attending: Nurse Practitioner | Admitting: Nurse Practitioner

## 2021-02-09 ENCOUNTER — Inpatient Hospital Stay (HOSPITAL_BASED_OUTPATIENT_CLINIC_OR_DEPARTMENT_OTHER): Payer: Medicare Other | Admitting: Nurse Practitioner

## 2021-02-09 ENCOUNTER — Encounter: Payer: Self-pay | Admitting: Nurse Practitioner

## 2021-02-09 VITALS — BP 147/63 | HR 82 | Temp 98.0°F | Resp 18 | Wt 137.1 lb

## 2021-02-09 DIAGNOSIS — Z95828 Presence of other vascular implants and grafts: Secondary | ICD-10-CM | POA: Diagnosis not present

## 2021-02-09 DIAGNOSIS — R051 Acute cough: Secondary | ICD-10-CM

## 2021-02-09 DIAGNOSIS — C250 Malignant neoplasm of head of pancreas: Secondary | ICD-10-CM | POA: Diagnosis not present

## 2021-02-09 DIAGNOSIS — R059 Cough, unspecified: Secondary | ICD-10-CM | POA: Diagnosis not present

## 2021-02-09 DIAGNOSIS — Z5111 Encounter for antineoplastic chemotherapy: Secondary | ICD-10-CM | POA: Diagnosis not present

## 2021-02-09 LAB — CBC WITH DIFFERENTIAL (CANCER CENTER ONLY)
Abs Immature Granulocytes: 0.03 10*3/uL (ref 0.00–0.07)
Basophils Absolute: 0 10*3/uL (ref 0.0–0.1)
Basophils Relative: 1 %
Eosinophils Absolute: 0.3 10*3/uL (ref 0.0–0.5)
Eosinophils Relative: 6 %
HCT: 27.4 % — ABNORMAL LOW (ref 36.0–46.0)
Hemoglobin: 8.7 g/dL — ABNORMAL LOW (ref 12.0–15.0)
Immature Granulocytes: 1 %
Lymphocytes Relative: 28 %
Lymphs Abs: 1.3 10*3/uL (ref 0.7–4.0)
MCH: 27.9 pg (ref 26.0–34.0)
MCHC: 31.8 g/dL (ref 30.0–36.0)
MCV: 87.8 fL (ref 80.0–100.0)
Monocytes Absolute: 0.7 10*3/uL (ref 0.1–1.0)
Monocytes Relative: 15 %
Neutro Abs: 2.4 10*3/uL (ref 1.7–7.7)
Neutrophils Relative %: 49 %
Platelet Count: 425 10*3/uL — ABNORMAL HIGH (ref 150–400)
RBC: 3.12 MIL/uL — ABNORMAL LOW (ref 3.87–5.11)
RDW: 15.9 % — ABNORMAL HIGH (ref 11.5–15.5)
WBC Count: 4.7 10*3/uL (ref 4.0–10.5)
nRBC: 0 % (ref 0.0–0.2)

## 2021-02-09 LAB — CMP (CANCER CENTER ONLY)
ALT: 15 U/L (ref 0–44)
AST: 24 U/L (ref 15–41)
Albumin: 3.4 g/dL — ABNORMAL LOW (ref 3.5–5.0)
Alkaline Phosphatase: 99 U/L (ref 38–126)
Anion gap: 8 (ref 5–15)
BUN: 10 mg/dL (ref 8–23)
CO2: 26 mmol/L (ref 22–32)
Calcium: 8.7 mg/dL — ABNORMAL LOW (ref 8.9–10.3)
Chloride: 106 mmol/L (ref 98–111)
Creatinine: 0.7 mg/dL (ref 0.44–1.00)
GFR, Estimated: >60 mL/min (ref >60)
Glucose, Bld: 85 mg/dL (ref 70–99)
Potassium: 4 mmol/L (ref 3.5–5.1)
Sodium: 140 mmol/L (ref 135–145)
Total Bilirubin: 0.6 mg/dL (ref 0.3–1.2)
Total Protein: 6.7 g/dL (ref 6.5–8.1)

## 2021-02-09 MED ORDER — SODIUM CHLORIDE 0.9% FLUSH
10.0000 mL | INTRAVENOUS | Status: DC | PRN
  Administered 2021-02-09: 10 mL via INTRAVENOUS

## 2021-02-09 MED ORDER — HEPARIN SOD (PORK) LOCK FLUSH 100 UNIT/ML IV SOLN
500.0000 [IU] | Freq: Once | INTRAVENOUS | Status: AC
Start: 2021-02-09 — End: 2021-02-09
  Administered 2021-02-09: 500 [IU] via INTRAVENOUS

## 2021-02-09 MED ORDER — SODIUM CHLORIDE 0.9% FLUSH
10.0000 mL | Freq: Once | INTRAVENOUS | Status: AC
  Administered 2021-02-09: 10 mL

## 2021-02-09 NOTE — Patient Instructions (Signed)

## 2021-02-09 NOTE — Progress Notes (Signed)
[facility]   Telephone:([phone]) [phone] Fax:(336) [phone]   Clinic Follow up Note   Patient Care Team: [name], DO as PCP - General (Family Medicine) [name], RN (Inactive) as Oncology Nurse Navigator [name], MD as Consulting Physician (Oncology) [name], MD as Consulting Physician (Gastroenterology) 02/09/2021  CHIEF COMPLAINT: Follow up pancreas cancer   SUMMARY OF ONCOLOGIC HISTORY: Oncology History Overview Note  Cancer Staging Pancreatic cancer [facility]) Staging form: Exocrine Pancreas, AJCC 8th Edition - Clinical stage from 09/26/2020: Stage IB (cT2, cN0, cM0) - Signed by [name], MD on 09/29/2020 Stage prefix: Initial diagnosis Total positive nodes: 0    Pancreatic cancer ([facility])  09/18/2020 Imaging   US Abdomen  IMPRESSION: Intrahepatic and extrahepatic biliary ductal dilatation. Common bile duct dilated up to 13 mm. This could be further evaluated with MRCP or ERCP.   Sludge within the gallbladder with associated gallbladder wall thickening and pericholecystic fluid. Cannot exclude acute cholecystitis.   Multiple hepatic cysts. Complex cystic area posteriorly in the right hepatic lobe. This could also be further evaluated with MRI.   09/19/2020 Procedure   EUS and ERCP by Dr Benson Norway   EUS IMPRESSION - A mass was identified in the pancreatic head. The staging applies if malignancy is confirmed. Fine needle aspiration performed. - There was dilation in the common hepatic duct which measured up to 13 mm. - There was dilation in the intrahepatic bile ducts, diffusely. - A cyst was found in the left lobe of the liver and measured 24 mm by 15 mm. - Endosonographic images of the left adrenal gland were unremarkable.  ERCP IMPRESSION - The major papilla appeared normal. - A biliary sphincterotomy was performed. - Cells for cytology obtained distal CBD. - One covered metal stent was placed into the common bile duct.     09/19/2020  Pathology Results   A. PANCREAS, HEAD, FINE NEEDLE ASPIRATION:  - Benign reactive/reparative changes  - Bland spindle cell fragments   DIAGNOSTIC COMMENTS:  There are fragments of bland spindle cells, favored to represent smooth muscle tissue. The glandular fragments are also bland and thus, there is no definitive malignancy identified   B. BILIARY, STRICTURE, BRUSHING:  - Benign reactive/reparative changes   09/20/2020 Imaging   MRI Abdomen  IMPRESSION: 1. 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 Recommend ERCP/EUS or tissue sampling. 2. There is mild biliary ductal dilatation of the common hepatic bile duct and the intrahepatic bile ducts in the LEFT hepatic lobe. Sequences are severely degraded by patient motion. Concern for obstruction of the common bile duct by the pancreatic head mass. 3. Multiple cystic lesions throughout the liver parenchyma. Assessment of enhancement is difficult due to patient motion as well as no true noncontrast T1 weighted imaging as described above. Favor complex benign cysts but consider liver protocol contrast CT for further evaluation of the cystic lesions to exclude metastasis.   09/21/2020 Imaging   CT CAP  IMPRESSION: 1. Heterogeneous mass of the central pancreatic head measuring 4.5 x 3.3 cm. There is atrophy of the distal pancreatic parenchyma with diffuse pancreatic ductal dilatation up to 0.8 cm. Mass appears to closely abut the lateral surface of the portal vein near the confluence. The superior mesenteric artery is well separated by a fat plane, as is the hepatic artery. 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 The exact borders and size of the mass are better delineated by prior  MR. 2. No evidence of metastatic disease in the chest, abdomen, or pelvis. 3. There are numerous lobulated, fluid attenuating cysts throughout the hepatic  parenchyma, numerous additional subcentimeter lesions are too small to characterize although demonstrate no evidence of contrast enhancement and are likely additional subcentimeter cysts. Attention on follow-up. 4. Status post common bile duct stent with post stenting pneumobilia. 5. Thickening and fat stranding about the gallbladder as well as about the adjacent duodenal bulb and descending duodenum, of uncertain etiology, concerning for cholecystitis, enteritis, and/or pancreatitis, possibly related to recent endoscopic procedure. Aortic Atherosclerosis (ICD10-I70.0).   09/26/2020 Cancer Staging   Staging form: Exocrine Pancreas, AJCC 8th Edition - Clinical stage from 09/26/2020: Stage IIA (cT3, cN0, cM0) - Signed by [name], MD on 09/29/2020 Stage prefix: Initial diagnosis Total positive nodes: 0    09/26/2020 Procedure   EUS by Dr Benson Norway  IMPRESSION - A mass was identified in the pancreatic head. Cytology results are pending. However, the endosonographic appearance is consistent with adenocarcinoma. Fine needle aspiration performed.   09/26/2020 Initial Biopsy   A. PANCREAS, HEAD, FINE NEEDLE ASPIRATION:  - Malignant cells consistent with adenocarcinoma    09/29/2020 Initial Diagnosis   Pancreatic cancer ([facility])   10/07/2020 -  Chemotherapy   First-line Gemcitabine and Abraxane 2 weeks on/1 week off starting 10/07/20.  C1 given with Gemcitabine alone.  Abraxane added with cycle 2 and changed to every 2 weeks    Genetic Testing   Negative genetic testing. No pathogenic variants identified on the [facility] CancerNext-Expanded+RNA panel. The report date is 10/16/2020.  The CancerNext-Expanded + RNAinsight gene panel offered by [employer] and includes sequencing and rearrangement analysis for the following 77 genes: IP, ALK, APC*, ATM*, AXIN2, BAP1, BARD1, BLM, BMPR1A, BRCA1*, BRCA2*, BRIP1*, CDC73, CDH1*,CDK4, CDKN1B, CDKN2A, CHEK2*, CTNNA1, DICER1, FANCC, FH, FLCN, GALNT12, KIF1B, LZTR1,  MAX, MEN1, MET, MLH1*, MSH2*, MSH3, MSH6*, MUTYH*, NBN, NF1*, NF2, NTHL1, PALB2*, PHOX2B, PMS2*, POT1, PRKAR1A, PTCH1, PTEN*, RAD51C*, RAD51D*,RB1, RECQL, RET, SDHA, SDHAF2, SDHB, SDHC, SDHD, SMAD4, SMARCA4, SMARCB1, SMARCE1, STK11, SUFU, TMEM127, TP53*,TSC1, TSC2, VHL and XRCC2 (sequencing and deletion/duplication); EGFR, EGLN1, HOXB13, KIT, MITF, PDGFRA, POLD1 and POLE (sequencing only); EPCAM and GREM1 (deletion/duplication only).   01/06/2021 Imaging   CT CAP at [facility] Impression:  1.  Similar pancreatic head mass with unchanged associated vascular involvement and pancreatic ductal dilatation.  2.  Stable multifocal hypodense liver lesions, some of which are indeterminate. Consider MR liver with and without IV contrast for further evaluation.  3.  No evidence of new or increasing metastatic disease in the chest, abdomen or pelvis.      CURRENT THERAPY: First-line Gemcitabine and Abraxane every 2 weeks starting 10/07/20. C1 given with Gemcitabine alone. Changed to 2 weeks on/1 week off 01/19/21  INTERVAL HISTORY: Tina Patel returns for follow up and treatment as scheduled. Last seen by me 01/19/21, chemo was intensified to gem/abraxane on days 1 and 8 q21 days which she completed on 10/3 and 10/10.  She developed a congested cough, called PCP and was prescribed azithromycin and decongestant on 02/03/2021.  She does not think her cough has improved much, worse at night.  Still has phlegm but cannot always cough it up.  Denies sore throat, loss of taste or smell, fever, chills, chest pain, dyspnea.  Otherwise did well with chemo without significant fatigue, nausea/vomiting, or any other concerns.   MEDICAL HISTORY:  Past Medical History:  Diagnosis Date   Diabetes ([facility])    Diabetes mellitus  Family history of colon cancer    Family history of lung cancer    Family history of multiple myeloma    Family history of prostate cancer    High cholesterol    Hypertension    Hypothyroid     SURGICAL  HISTORY: Past Surgical History:  Procedure Laterality Date   BILIARY BRUSHING  09/19/2020   Procedure: BILIARY BRUSHING;  Surgeon: [name], MD;  Location: [facility] ENDOSCOPY;  Service: Endoscopy;;   BILIARY STENT PLACEMENT N/A 09/19/2020   Procedure: BILIARY STENT PLACEMENT;  Surgeon: [name], MD;  Location: [facility];  Service: Endoscopy;  Laterality: N/A;   ERCP N/A 09/19/2020   Procedure: ENDOSCOPIC RETROGRADE CHOLANGIOPANCREATOGRAPHY (ERCP);  Surgeon: [name], MD;  Location: [facility];  Service: Endoscopy;  Laterality: N/A;   ESOPHAGOGASTRODUODENOSCOPY (EGD) WITH PROPOFOL N/A 09/19/2020   Procedure: ESOPHAGOGASTRODUODENOSCOPY (EGD) WITH PROPOFOL;  Surgeon: [name], MD;  Location: [facility];  Service: Endoscopy;  Laterality: N/A;   ESOPHAGOGASTRODUODENOSCOPY (EGD) WITH PROPOFOL N/A 09/26/2020   Procedure: ESOPHAGOGASTRODUODENOSCOPY (EGD) WITH PROPOFOL;  Surgeon: [name], MD;  Location: [facility];  Service: Endoscopy;  Laterality: N/A;   EUS N/A 09/19/2020   Procedure: UPPER ENDOSCOPIC ULTRASOUND (EUS) LINEAR;  Surgeon: [name], MD;  Location: [facility];  Service: Endoscopy;  Laterality: N/A;   FINE NEEDLE ASPIRATION  09/19/2020   Procedure: FINE NEEDLE ASPIRATION;  Surgeon: [name], MD;  Location: [facility];  Service: Endoscopy;;   FINE NEEDLE ASPIRATION N/A 09/26/2020   Procedure: FINE NEEDLE ASPIRATION (FNA) LINEAR;  Surgeon: [name], MD;  Location: [facility];  Service: Endoscopy;  Laterality: N/A;   FINGER SURGERY     IR IMAGING GUIDED PORT INSERTION  11/24/2020   SPHINCTEROTOMY  09/19/2020   Procedure: SPHINCTEROTOMY;  Surgeon: [name], MD;  Location: [facility];  Service: Endoscopy;;   UPPER ESOPHAGEAL ENDOSCOPIC ULTRASOUND (EUS) N/A 09/26/2020   Procedure: UPPER ESOPHAGEAL ENDOSCOPIC ULTRASOUND (EUS);  Surgeon: [name], MD;  Location: [facility];  Service: Endoscopy;  Laterality: N/A;    I have reviewed the social history  and family history with the patient and they are unchanged from previous note.  ALLERGIES:  has No Known Allergies.  MEDICATIONS:  Current Outpatient Medications  Medication Sig Dispense Refill   amLODipine (NORVASC) 5 MG tablet Take 5 mg by mouth daily.     blood glucose meter kit and supplies KIT Dispense based on patient and insurance preference. Use up to four times daily as directed. 1 each 0   calcium carbonate (TUMS - DOSED IN MG ELEMENTAL CALCIUM) 500 MG chewable tablet Chew 500 mg by mouth daily as needed for indigestion or heartburn.     famotidine (PEPCID) 20 MG tablet Take 20 mg by mouth at bedtime.     furosemide (LASIX) 20 MG tablet Take 20 mg by mouth daily.     insulin glargine (LANTUS) 100 UNIT/ML Solostar Pen Inject 8 Units into the skin daily. 15 mL 0   levothyroxine (SYNTHROID) 75 MCG tablet Take 75 mcg by mouth daily before breakfast.     lidocaine-prilocaine (EMLA) cream Apply 1 application topically as needed. 30 g 2   lisinopril (ZESTRIL) 10 MG tablet Take 10 mg by mouth daily.     metFORMIN (GLUCOPHAGE-XR) 500 MG 24 hr tablet Take 500 mg by mouth 2 (two) times daily.     mirtazapine (REMERON) 7.5 MG tablet TAKE 1 TABLET BY MOUTH EVERYDAY AT BEDTIME 90 tablet 1   ondansetron (ZOFRAN) 8 MG tablet Take 1  tablet (8 mg total) by mouth 2 (two) times daily as needed (Nausea or vomiting). 30 tablet 1   prochlorperazine (COMPAZINE) 10 MG tablet Take 1 tablet (10 mg total) by mouth every 6 (six) hours as needed (Nausea or vomiting). 30 tablet 1   traMADol (ULTRAM) 50 MG tablet Take 1 tablet (50 mg total) by mouth every 12 (twelve) hours as needed. 30 tablet 0   Vitamin D, Ergocalciferol, 50 MCG (2000 UT) CAPS Take 2,000 Units by mouth daily.     Current Facility-Administered Medications  Medication Dose Route Frequency Provider Last Rate Last Admin   sodium chloride flush (NS) 0.9 % injection 10 mL  10 mL Intravenous PRN Alla Feeling, NP   10 mL at 02/09/21 1125     PHYSICAL EXAMINATION: ECOG PERFORMANCE STATUS: 1 - Symptomatic but completely ambulatory  Vitals:   02/09/21 1048  BP: (!) 147/63  Pulse: 82  Resp: 18  Temp: 98 F (36.7 C)  SpO2: 100%   Filed Weights   02/09/21 1048  Weight: 137 lb 1 oz (62.2 kg)    GENERAL:alert, no distress and comfortable SKIN: No rash EYES: sclera clear NECK: Without mass LUNGS: Coarse at the left lung base, normal breathing effort HEART: regular rate & rhythm, no lower extremity edema NEURO: alert & oriented x 3 with fluent speech, no focal motor/sensory deficits PAC without erythema  LABORATORY DATA:  I have reviewed the data as listed CBC Latest Ref Rng & Units 02/09/2021 01/26/2021 01/19/2021  WBC 4.0 - 10.5 K/uL 4.7 3.2(L) 4.6  Hemoglobin 12.0 - 15.0 g/dL 8.7(L) 8.3(L) 9.3(L)  Hematocrit 36.0 - 46.0 % 27.4(L) 25.9(L) 29.7(L)  Platelets 150 - 400 K/uL 425(H) 308 292     CMP Latest Ref Rng & Units 02/09/2021 01/26/2021 01/19/2021  Glucose 70 - 99 mg/dL 85 177(H) 99  BUN 8 - 23 mg/dL 10 16 14   Creatinine 0.44 - 1.00 mg/dL 0.70 0.86 0.79  Sodium 135 - 145 mmol/L 140 139 140  Potassium 3.5 - 5.1 mmol/L 4.0 4.1 4.2  Chloride 98 - 111 mmol/L 106 105 105  CO2 22 - 32 mmol/L 26 23 26   Calcium 8.9 - 10.3 mg/dL 8.7(L) 9.0 9.1  Total Protein 6.5 - 8.1 g/dL 6.7 6.6 6.9  Total Bilirubin 0.3 - 1.2 mg/dL 0.6 0.3 0.4  Alkaline Phos 38 - 126 U/L 99 137(H) 96  AST 15 - 41 U/L 24 26 25   ALT 0 - 44 U/L 15 17 15       RADIOGRAPHIC STUDIES: I have personally reviewed the radiological images as listed and agreed with the findings in the report. No results found.   ASSESSMENT & PLAN: Tina Patel is a 80 y.o.     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 Her initial EUS on 09/19/20 showed mass at head of pancreas, but biopsy was negative. She had ERCP stent placement same day. -Her repeat  EUS biopsy of the mass on 09/26/20 showed adenocarcinoma from pancreatic primary. Her CT CAP showed mass to be 4.5cm. No evidence of metastatic disease in the chest, abdomen, or pelvis. She does have multiple benign cystic lesions in the liver.   -Per GI tumor board discussion, neoadjuvant chemotherapy was recommended and she was referred to [facility] Carmelina Noun [facility] for surgical consideration.   -She began gemcitabine 2 weeks on/1 week off on 10/07/2020, Abraxane was added with cycle 2 and changed to  every 2 weeks.  She has been tolerating very well with relatively no side effects  -Restaging CT [facility] at [facility] showed similar pancreatic head mass with unchanged associated vascular involvement and stable liver lesions which remain indeterminate.  No evidence of progression.  Per the family, doctors Zani recommended an additional 3 months of chemo, the note is not available. -Given her good tolerance and high performance status, Dr. Burr Medico recommended to intensify to gem/Abraxane 2 weeks on/1 week off she started 01/19/2021 -Tolerated well except she developed a cough.  Holding chemo for further work-up.  We will reschedule when she has recovered well   2. Obstructive Jaundice, Hyperbilirubinemia, Secondary to #1 -She had jaundice and elevated Tbili in early June 2022 during hospitalization, with RUQ pain controlled on tramadol  -CA 19-9 was elevated to 84 on 09/19/2020 when she was obstructed, normalized after stenting -S/p ERCP and stent placement 09/19/2020 with Dr. Benson Norway, symptoms much improved and jaundice resolved (10/06/2020)   3. Weight loss, Fatigue, Secondary to #1 -For the past 3-6 months she has had 25 pounds weight loss and more fatigued.  -She has low appetite, nausea and mild early satiety. She forces herself to remain active at home. -Continue mirtazapine and supplements -Follow-up with dietitian -Stable   4. 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 Genetic Testing -Given  her pancreatic cancer she is eligible for genetic testing. She is interested.    6. Social Support -She has great social support from her husband (69) yo) who she lives with and her 3 children who live in town.   7. Cough -she developed a productive cough >1 week ago. Completed azithromycin per PCP and continues mucinex. Not much improved -no fever,chills, chest pain, dyspnea, loss of taste/smell, or sore throat. -likely viral or allergies -chest xray pending -hold chemo for recovery    Disposition: Tina Patel appears stable.  She continues to tolerate gemcitabine and Abraxane, most recent cycle changed to 2 weeks on/1 week off starting 01/19/21.  She tolerated well except she developed a productive cough, completed antibiotics and OTC decongestant without much improvement.  We discussed this is likely allergies vs viral, we reviewed symptom management.  She has no other concerning signs of infection, CBC is reassuring.  She was referred for chest x-ray, the result is pending but does not look concerning.  Given her age and high risk for complications, I recommend to postpone chemo today.  She will return for follow-up and treatment next week if she has recovered well.  She can get her flu vaccine when she has recovered well.   Orders Placed This Encounter  Procedures   DG Chest 2 View    Standing Status:   Future    Number of Occurrences:   1    Standing Expiration Date:   02/09/2022    Order Specific Question:   Reason for Exam (SYMPTOM  OR DIAGNOSIS REQUIRED)    Answer:   >1 week h/o cough, completed antibiotics, no improvement. pancrease cancer on chemo    Order Specific Question:   Preferred imaging location?    Answer:   [facility]   All questions were answered. The patient knows to call the clinic with any problems, questions or concerns. No barriers to learning were detected.  Total encounter time was 30 minutes.     Alla Feeling, NP 02/09/21

## 2021-02-10 ENCOUNTER — Telehealth: Payer: Self-pay | Admitting: *Deleted

## 2021-02-10 NOTE — Telephone Encounter (Addendum)
Contacted patient with information in message below. Spoke with patient's husband, he verbalized understanding of information. He stated patient wanted to come on Monday 10/31 as planned.   Please let pt know chest xray is negative/normal, no signs of infection. If she is feeling better, we can try to do chemo tomorrow if available, otherwise will keep it as is Monday 10/31 and the week after. Let me know what she prefers.  Thanks, Regan Rakers, NP

## 2021-02-11 ENCOUNTER — Telehealth: Payer: Self-pay | Admitting: Nurse Practitioner

## 2021-02-11 ENCOUNTER — Telehealth: Payer: Self-pay | Admitting: Hematology

## 2021-02-11 NOTE — Telephone Encounter (Signed)
Scheduled follow-up appointment per 10/25 patient call message. Patient's husband is aware.

## 2021-02-11 NOTE — Telephone Encounter (Signed)
Sch per 10/24 los, pt husband aware

## 2021-02-15 NOTE — Progress Notes (Signed)
[place]   Telephone:([phone] Fax:([phone]) [phone]   Clinic Follow up Note   Patient Care Team: [name], DO as PCP - General (Family Medicine) [name], RN (Inactive) as Oncology Nurse Navigator [name], MD as Consulting Physician (Oncology) [name], MD as Consulting Physician (Gastroenterology) 02/16/2021  CHIEF COMPLAINT: Follow up pancreas cancer and recent cough  SUMMARY OF ONCOLOGIC HISTORY: Oncology History Overview Note  Cancer Staging Pancreatic cancer [facility]) Staging form: Exocrine Pancreas, AJCC 8th Edition - Clinical stage from 09/26/2020: Stage IB (cT2, cN0, cM0) - Signed by [name], MD on 09/29/2020 Stage prefix: Initial diagnosis Total positive nodes: 0    Pancreatic cancer ([name])  09/18/2020 Imaging   US Abdomen  IMPRESSION: Intrahepatic and extrahepatic biliary ductal dilatation. Common bile duct dilated up to 13 mm. This could be further evaluated with MRCP or ERCP.   Sludge within the gallbladder with associated gallbladder wall thickening and pericholecystic fluid. Cannot exclude acute cholecystitis.   Multiple hepatic cysts. Complex cystic area posteriorly in the right hepatic lobe. This could also be further evaluated with MRI.   09/19/2020 Procedure   EUS and ERCP by Dr Benson Norway   EUS IMPRESSION - A mass was identified in the pancreatic head. The staging applies if malignancy is confirmed. Fine needle aspiration performed. - There was dilation in the common hepatic duct which measured up to 13 mm. - There was dilation in the intrahepatic bile ducts, diffusely. - A cyst was found in the left lobe of the liver and measured 24 mm by 15 mm. - Endosonographic images of the left adrenal gland were unremarkable.  ERCP IMPRESSION - The major papilla appeared normal. - A biliary sphincterotomy was performed. - Cells for cytology obtained distal CBD. - One covered metal stent was placed into the common bile duct.      09/19/2020 Pathology Results   A. PANCREAS, HEAD, FINE NEEDLE ASPIRATION:  - Benign reactive/reparative changes  - Bland spindle cell fragments   DIAGNOSTIC COMMENTS:  There are fragments of bland spindle cells, favored to represent smooth muscle tissue. The glandular fragments are also bland and thus, there is no definitive malignancy identified   B. BILIARY, STRICTURE, BRUSHING:  - Benign reactive/reparative changes   09/20/2020 Imaging   MRI Abdomen  IMPRESSION: 1. 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 Recommend ERCP/EUS or tissue sampling. 2. There is mild biliary ductal dilatation of the common hepatic bile duct and the intrahepatic bile ducts in the LEFT hepatic lobe. Sequences are severely degraded by patient motion. Concern for obstruction of the common bile duct by the pancreatic head mass. 3. Multiple cystic lesions throughout the liver parenchyma. Assessment of enhancement is difficult due to patient motion as well as no true noncontrast T1 weighted imaging as described above. Favor complex benign cysts but consider liver protocol contrast CT for further evaluation of the cystic lesions to exclude metastasis.   09/21/2020 Imaging   CT CAP  IMPRESSION: 1. Heterogeneous mass of the central pancreatic head measuring 4.5 x 3.3 cm. There is atrophy of the distal pancreatic parenchyma with diffuse pancreatic ductal dilatation up to 0.8 cm. Mass appears to closely abut the lateral surface of the portal vein near the confluence. The superior mesenteric artery is well separated by a fat plane, as is the hepatic artery. 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 The exact borders and size of the mass are better delineated  by prior MR. 2. No evidence of metastatic disease in the chest, abdomen, or pelvis. 3. There are numerous lobulated, fluid attenuating cysts throughout the  hepatic parenchyma, numerous additional subcentimeter lesions are too small to characterize although demonstrate no evidence of contrast enhancement and are likely additional subcentimeter cysts. Attention on follow-up. 4. Status post common bile duct stent with post stenting pneumobilia. 5. Thickening and fat stranding about the gallbladder as well as about the adjacent duodenal bulb and descending duodenum, of uncertain etiology, concerning for cholecystitis, enteritis, and/or pancreatitis, possibly related to recent endoscopic procedure. Aortic Atherosclerosis (ICD10-I70.0).   09/26/2020 Cancer Staging   Staging form: Exocrine Pancreas, AJCC 8th Edition - Clinical stage from 09/26/2020: Stage IIA (cT3, cN0, cM0) - Signed by [name], MD on 09/29/2020 Stage prefix: Initial diagnosis Total positive nodes: 0    09/26/2020 Procedure   EUS by Dr Benson Norway  IMPRESSION - A mass was identified in the pancreatic head. Cytology results are pending. However, the endosonographic appearance is consistent with adenocarcinoma. Fine needle aspiration performed.   09/26/2020 Initial Biopsy   A. PANCREAS, HEAD, FINE NEEDLE ASPIRATION:  - Malignant cells consistent with adenocarcinoma    09/29/2020 Initial Diagnosis   Pancreatic cancer ([name])   10/07/2020 -  Chemotherapy   First-line Gemcitabine and Abraxane 2 weeks on/1 week off starting 10/07/20.  C1 given with Gemcitabine alone.  Abraxane added with cycle 2 and changed to every 2 weeks    Genetic Testing   Negative genetic testing. No pathogenic variants identified on the [facility] CancerNext-Expanded+RNA panel. The report date is 10/16/2020.  The CancerNext-Expanded + RNAinsight gene panel offered by [employer] and includes sequencing and rearrangement analysis for the following 77 genes: IP, ALK, APC*, ATM*, AXIN2, BAP1, BARD1, BLM, BMPR1A, BRCA1*, BRCA2*, BRIP1*, CDC73, CDH1*,CDK4, CDKN1B, CDKN2A, CHEK2*, CTNNA1, DICER1, FANCC, FH, FLCN, GALNT12, KIF1B,  LZTR1, MAX, MEN1, MET, MLH1*, MSH2*, MSH3, MSH6*, MUTYH*, NBN, NF1*, NF2, NTHL1, PALB2*, PHOX2B, PMS2*, POT1, PRKAR1A, PTCH1, PTEN*, RAD51C*, RAD51D*,RB1, RECQL, RET, SDHA, SDHAF2, SDHB, SDHC, SDHD, SMAD4, SMARCA4, SMARCB1, SMARCE1, STK11, SUFU, TMEM127, TP53*,TSC1, TSC2, VHL and XRCC2 (sequencing and deletion/duplication); EGFR, EGLN1, HOXB13, KIT, MITF, PDGFRA, POLD1 and POLE (sequencing only); EPCAM and GREM1 (deletion/duplication only).   01/06/2021 Imaging   CT CAP at [facility] Impression:  1.  Similar pancreatic head mass with unchanged associated vascular involvement and pancreatic ductal dilatation.  2.  Stable multifocal hypodense liver lesions, some of which are indeterminate. Consider MR liver with and without IV contrast for further evaluation.  3.  No evidence of new or increasing metastatic disease in the chest, abdomen or pelvis.      CURRENT THERAPY: First-line Gemcitabine and Abraxane every 2 weeks starting 10/07/20. C1 given with Gemcitabine alone. Changed to 2 weeks on/1 week off 01/19/21  INTERVAL HISTORY: Ms. Ciccarelli returns for follow up as scheduled. Last seen by me 10/24 to start next chemo cycle which was ultimately held due to upper respiratory illness. Chest xray was negative.  Her cough has improved but not completely resolved, mainly at night.  She continues Mucinex-type medication.  She has a longstanding history of reflux, on Pepcid.  She denies fever, chills, chest pain, dyspnea.  She otherwise tolerates treatment, no nausea/vomiting.  Bowels moving normally.  Legs get tired but remains able to complete all activities without difficulty.  Denies new or worsening pain.   MEDICAL HISTORY:  Past Medical History:  Diagnosis Date   Diabetes ([place])    Diabetes mellitus    Family history of  colon cancer    Family history of lung cancer    Family history of multiple myeloma    Family history of prostate cancer    High cholesterol    Hypertension    Hypothyroid     SURGICAL  HISTORY: Past Surgical History:  Procedure Laterality Date   BILIARY BRUSHING  09/19/2020   Procedure: BILIARY BRUSHING;  Surgeon: [name], MD;  Location: [facility];  Service: Endoscopy;;   BILIARY STENT PLACEMENT N/A 09/19/2020   Procedure: BILIARY STENT PLACEMENT;  Surgeon: [name], MD;  Location: [facility];  Service: Endoscopy;  Laterality: N/A;   ERCP N/A 09/19/2020   Procedure: ENDOSCOPIC RETROGRADE CHOLANGIOPANCREATOGRAPHY (ERCP);  Surgeon: [name], MD;  Location: [facility];  Service: Endoscopy;  Laterality: N/A;   ESOPHAGOGASTRODUODENOSCOPY (EGD) WITH PROPOFOL N/A 09/19/2020   Procedure: ESOPHAGOGASTRODUODENOSCOPY (EGD) WITH PROPOFOL;  Surgeon: [name], MD;  Location: [facility];  Service: Endoscopy;  Laterality: N/A;   ESOPHAGOGASTRODUODENOSCOPY (EGD) WITH PROPOFOL N/A 09/26/2020   Procedure: ESOPHAGOGASTRODUODENOSCOPY (EGD) WITH PROPOFOL;  Surgeon: [name], MD;  Location: [facility];  Service: Endoscopy;  Laterality: N/A;   EUS N/A 09/19/2020   Procedure: UPPER ENDOSCOPIC ULTRASOUND (EUS) LINEAR;  Surgeon: [name], MD;  Location: [facility];  Service: Endoscopy;  Laterality: N/A;   FINE NEEDLE ASPIRATION  09/19/2020   Procedure: FINE NEEDLE ASPIRATION;  Surgeon: [name], MD;  Location: [facility];  Service: Endoscopy;;   FINE NEEDLE ASPIRATION N/A 09/26/2020   Procedure: FINE NEEDLE ASPIRATION (FNA) LINEAR;  Surgeon: [name], MD;  Location: [facility];  Service: Endoscopy;  Laterality: N/A;   FINGER SURGERY     IR IMAGING GUIDED PORT INSERTION  11/24/2020   SPHINCTEROTOMY  09/19/2020   Procedure: SPHINCTEROTOMY;  Surgeon: [name], MD;  Location: [facility];  Service: Endoscopy;;   UPPER ESOPHAGEAL ENDOSCOPIC ULTRASOUND (EUS) N/A 09/26/2020   Procedure: UPPER ESOPHAGEAL ENDOSCOPIC ULTRASOUND (EUS);  Surgeon: [name], MD;  Location: [facility];  Service: Endoscopy;  Laterality: N/A;    I have reviewed the social history  and family history with the patient and they are unchanged from previous note.  ALLERGIES:  has No Known Allergies.  MEDICATIONS:  Current Outpatient Medications  Medication Sig Dispense Refill   pantoprazole (PROTONIX) 20 MG tablet Take 1 tablet (20 mg total) by mouth daily. 30 tablet 1   amLODipine (NORVASC) 5 MG tablet Take 5 mg by mouth daily.     blood glucose meter kit and supplies KIT Dispense based on patient and insurance preference. Use up to four times daily as directed. 1 each 0   calcium carbonate (TUMS - DOSED IN MG ELEMENTAL CALCIUM) 500 MG chewable tablet Chew 500 mg by mouth daily as needed for indigestion or heartburn.     furosemide (LASIX) 20 MG tablet Take 20 mg by mouth daily.     insulin glargine (LANTUS) 100 UNIT/ML Solostar Pen Inject 8 Units into the skin daily. 15 mL 0   levothyroxine (SYNTHROID) 75 MCG tablet Take 75 mcg by mouth daily before breakfast.     lidocaine-prilocaine (EMLA) cream Apply 1 application topically as needed. 30 g 2   lisinopril (ZESTRIL) 10 MG tablet Take 10 mg by mouth daily.     metFORMIN (GLUCOPHAGE-XR) 500 MG 24 hr tablet Take 500 mg by mouth 2 (two) times daily.     mirtazapine (REMERON) 7.5 MG tablet TAKE 1 TABLET BY MOUTH EVERYDAY AT BEDTIME 90 tablet 1   ondansetron (ZOFRAN) 8 MG tablet Take 1  tablet (8 mg total) by mouth 2 (two) times daily as needed (Nausea or vomiting). 30 tablet 1   prochlorperazine (COMPAZINE) 10 MG tablet Take 1 tablet (10 mg total) by mouth every 6 (six) hours as needed (Nausea or vomiting). 30 tablet 1   traMADol (ULTRAM) 50 MG tablet Take 1 tablet (50 mg total) by mouth every 12 (twelve) hours as needed. 30 tablet 0   Vitamin D, Ergocalciferol, 50 MCG (2000 UT) CAPS Take 2,000 Units by mouth daily.     No current facility-administered medications for this visit.   Facility-Administered Medications Ordered in Other Visits  Medication Dose Route Frequency Provider Last Rate Last Admin   diphenhydrAMINE  (BENADRYL) capsule 50 mg  50 mg Oral Once [name], MD       famotidine (PEPCID) IVPB 20 mg in NS 100 mL IVPB  20 mg Intravenous Once [name], MD       gemcitabine (GEMZAR) 1,710 mg in sodium chloride 0.9 % 250 mL chemo infusion  1,000 mg/m2 (Treatment Plan Recorded) Intravenous Once [name], MD       heparin lock flush 100 unit/mL  500 Units Intracatheter Once PRN [name], MD       PACLitaxel-protein bound (ABRAXANE) chemo infusion 175 mg  100 mg/m2 (Treatment Plan Recorded) Intravenous Once [name], MD       prochlorperazine (COMPAZINE) tablet 10 mg  10 mg Oral Once [name], MD       sodium chloride flush (NS) 0.9 % injection 10 mL  10 mL Intracatheter PRN [name], MD        PHYSICAL EXAMINATION: ECOG PERFORMANCE STATUS: 1 - Symptomatic but completely ambulatory  Vitals:   02/16/21 1322  BP: 132/65  Pulse: 94  Resp: 17  Temp: 98.6 F (37 C)  SpO2: 100%   Filed Weights   02/16/21 1322  Weight: 136 lb 1.6 oz (61.7 kg)    GENERAL:alert, no distress and comfortable SKIN: No rash EYES: sclera clear LUNGS: clear with normal breathing effort HEART: regular rate & rhythm, no lower extremity edema NEURO: alert & oriented x 3 with fluent speech, no focal motor deficits PAC without erythema  LABORATORY DATA:  I have reviewed the data as listed CBC Latest Ref Rng & Units 02/16/2021 02/09/2021 01/26/2021  WBC 4.0 - 10.5 K/uL 6.3 4.7 3.2(L)  Hemoglobin 12.0 - 15.0 g/dL 9.4(L) 8.7(L) 8.3(L)  Hematocrit 36.0 - 46.0 % 29.2(L) 27.4(L) 25.9(L)  Platelets 150 - 400 K/uL 455(H) 425(H) 308     CMP Latest Ref Rng & Units 02/16/2021 02/09/2021 01/26/2021  Glucose 70 - 99 mg/dL 136(H) 85 177(H)  BUN 8 - 23 mg/dL _0 Creatinine 0.44 - 1.00 mg/dL 0.73 0.70 0.86  Sodium 135 - 145 mmol/L 140 140 139  Potassium 3.5 - 5.1 mmol/L 4.3 4.0 4.1  Chloride 98 - 111 mmol/L 106 106 105  CO2 22 - 32 mmol/L _1 Calcium 8.9 - 10.3 mg/dL 8.7(L) 8.7(L) 9.0  Total Protein 6.5 - 8.1  g/dL 6.5 6.7 6.6  Total Bilirubin 0.3 - 1.2 mg/dL 0.2(L) 0.6 0.3  Alkaline Phos 38 - 126 U/L 93 99 137(H)  AST 15 - 41 U/L _2 ALT 0 - 44 U/L _3 RADIOGRAPHIC STUDIES: I have personally reviewed the radiological images as listed and agreed with the findings in the report. No results found.   ASSESSMENT & PLAN: Tina Patel is a  80 y.o.     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 Her initial EUS on 09/19/20 showed mass at head of pancreas, but biopsy was negative. She had ERCP stent placement same day. -Her repeat EUS biopsy of the mass on 09/26/20 showed adenocarcinoma from pancreatic primary. Her CT CAP showed mass to be 4.5cm. No evidence of metastatic disease in the chest, abdomen, or pelvis. She does have multiple benign cystic lesions in the liver.   -Per GI tumor board discussion, neoadjuvant chemotherapy was recommended and she was referred to Dr. Carmelina Noun at [facility] for surgical consideration.   -She began gemcitabine 2 weeks on/1 week off on 10/07/2020, Abraxane was added with cycle 2 and changed to every 2 weeks.  She has been tolerating very well with relatively no side effects  -Restaging CT 01/06/[facility]21 at [facility] showed similar pancreatic head mass with unchanged associated vascular involvement and stable liver lesions which remain indeterminate.  No evidence of progression.  Per the family, doctors Zani recommended an additional 3 months of chemo, the note is not available. -Given her good tolerance and high performance status, Dr. Burr Medico recommended to intensify to gem/Abraxane 2 weeks on/1 week off she started 01/19/2021 -Tolerated well except she developed a cough.  Chest x-ray normal, chemo was postponed 1 week.  Cough is improving, resume chemo today as planned   2. Obstructive Jaundice, Hyperbilirubinemia, Secondary to #1 -She had jaundice and elevated  Tbili in early June 2022 during hospitalization, with RUQ pain controlled on tramadol  -CA 19-9 was elevated to 84 on 09/19/2020 when she was obstructed, normalized after stenting -S/p ERCP and stent placement 09/19/2020 with Dr. Benson Norway, symptoms much improved and jaundice resolved (10/06/2020)   3. Weight loss, Fatigue, Secondary to #1 -For the past 3-6 months she has had 25 pounds weight loss and more fatigued.  -She has low appetite, nausea and mild early satiety. She forces herself to remain active at home. -Continue mirtazapine and supplements -Follow-up with dietitian -Stable   4. 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 Genetic Testing -Given her pancreatic cancer she is eligible for genetic testing. She is interested.    6. Social Support -She has great social support from her husband (28) yo) who she lives with and her 3 children who live in town.    7. Cough -she developed a productive cough >1 week ago. Completed azithromycin per PCP and continues mucinex. Not much improved -no fever,chills, chest pain, dyspnea, loss of taste/smell, or sore throat. -likely viral or allergies -Chest x-ray 02/09/2021 was negative -Cough is improving -Considering other causes such as reflux, will switch to PPI -If cough does not resolve, will consider CT chest   Disposition: Ms. Beutler appears stable.  Chest x-ray was negative for respiratory infection.  Her cough has improved but not resolved, mainly at night.  Continue Mucinex as needed.  I recommend to switch to PPI in the event her p.m. cough is secondary to reflux/GERD.  She is otherwise doing well, tolerating treatment without significant side effects.  She is able to recover and function well.  There is no clinical evidence of disease progression.  Labs reviewed, proceed with gemcitabine and Abraxane today as planned.  She will return next week for day 8, then follow-up in 2 weeks with next cycle.  All questions were  answered. The patient knows to call the clinic with any problems, questions or  concerns especially if cough worsens, fails to improve, or she develops other concerning changes such as fever/chills, chest pain, dyspnea. No barriers to learning were detected.     Alla Feeling, NP 02/16/21

## 2021-02-16 ENCOUNTER — Other Ambulatory Visit: Payer: Medicare Other

## 2021-02-16 ENCOUNTER — Ambulatory Visit: Payer: Medicare Other

## 2021-02-16 ENCOUNTER — Encounter: Payer: Self-pay | Admitting: Nurse Practitioner

## 2021-02-16 ENCOUNTER — Inpatient Hospital Stay: Payer: Medicare Other

## 2021-02-16 ENCOUNTER — Other Ambulatory Visit: Payer: Self-pay

## 2021-02-16 ENCOUNTER — Inpatient Hospital Stay (HOSPITAL_BASED_OUTPATIENT_CLINIC_OR_DEPARTMENT_OTHER): Payer: Medicare Other | Admitting: Nurse Practitioner

## 2021-02-16 VITALS — BP 132/65 | HR 94 | Temp 98.6°F | Resp 17 | Ht 66.0 in | Wt 136.1 lb

## 2021-02-16 DIAGNOSIS — C250 Malignant neoplasm of head of pancreas: Secondary | ICD-10-CM | POA: Diagnosis not present

## 2021-02-16 DIAGNOSIS — Z5111 Encounter for antineoplastic chemotherapy: Secondary | ICD-10-CM | POA: Diagnosis not present

## 2021-02-16 DIAGNOSIS — Z95828 Presence of other vascular implants and grafts: Secondary | ICD-10-CM

## 2021-02-16 DIAGNOSIS — R051 Acute cough: Secondary | ICD-10-CM

## 2021-02-16 LAB — CMP (CANCER CENTER ONLY)
ALT: 12 U/L (ref 0–44)
AST: 20 U/L (ref 15–41)
Albumin: 3.2 g/dL — ABNORMAL LOW (ref 3.5–5.0)
Alkaline Phosphatase: 93 U/L (ref 38–126)
Anion gap: 6 (ref 5–15)
BUN: 11 mg/dL (ref 8–23)
CO2: 28 mmol/L (ref 22–32)
Calcium: 8.7 mg/dL — ABNORMAL LOW (ref 8.9–10.3)
Chloride: 106 mmol/L (ref 98–111)
Creatinine: 0.73 mg/dL (ref 0.44–1.00)
GFR, Estimated: >60 mL/min (ref >60)
Glucose, Bld: 136 mg/dL — ABNORMAL HIGH (ref 70–99)
Potassium: 4.3 mmol/L (ref 3.5–5.1)
Sodium: 140 mmol/L (ref 135–145)
Total Bilirubin: 0.2 mg/dL — ABNORMAL LOW (ref 0.3–1.2)
Total Protein: 6.5 g/dL (ref 6.5–8.1)

## 2021-02-16 LAB — CBC WITH DIFFERENTIAL (CANCER CENTER ONLY)
Abs Immature Granulocytes: 0.1 10*3/uL — ABNORMAL HIGH (ref 0.00–0.07)
Basophils Absolute: 0.1 10*3/uL (ref 0.0–0.1)
Basophils Relative: 1 %
Eosinophils Absolute: 0.2 10*3/uL (ref 0.0–0.5)
Eosinophils Relative: 4 %
HCT: 29.2 % — ABNORMAL LOW (ref 36.0–46.0)
Hemoglobin: 9.4 g/dL — ABNORMAL LOW (ref 12.0–15.0)
Immature Granulocytes: 2 %
Lymphocytes Relative: 25 %
Lymphs Abs: 1.5 10*3/uL (ref 0.7–4.0)
MCH: 28.4 pg (ref 26.0–34.0)
MCHC: 32.2 g/dL (ref 30.0–36.0)
MCV: 88.2 fL (ref 80.0–100.0)
Monocytes Absolute: 0.7 10*3/uL (ref 0.1–1.0)
Monocytes Relative: 11 %
Neutro Abs: 3.6 10*3/uL (ref 1.7–7.7)
Neutrophils Relative %: 57 %
Platelet Count: 455 10*3/uL — ABNORMAL HIGH (ref 150–400)
RBC: 3.31 MIL/uL — ABNORMAL LOW (ref 3.87–5.11)
RDW: 16.2 % — ABNORMAL HIGH (ref 11.5–15.5)
WBC Count: 6.3 10*3/uL (ref 4.0–10.5)
nRBC: 0 % (ref 0.0–0.2)

## 2021-02-16 MED ORDER — FAMOTIDINE 20 MG IN NS 100 ML IVPB
20.0000 mg | Freq: Once | INTRAVENOUS | Status: AC
  Administered 2021-02-16: 20 mg via INTRAVENOUS
  Filled 2021-02-16: qty 100

## 2021-02-16 MED ORDER — DIPHENHYDRAMINE HCL 25 MG PO CAPS
50.0000 mg | ORAL_CAPSULE | Freq: Once | ORAL | Status: AC
  Administered 2021-02-16: 50 mg via ORAL
  Filled 2021-02-16: qty 2

## 2021-02-16 MED ORDER — HEPARIN SOD (PORK) LOCK FLUSH 100 UNIT/ML IV SOLN
500.0000 [IU] | Freq: Once | INTRAVENOUS | Status: AC | PRN
Start: 2021-02-16 — End: 2021-02-16
  Administered 2021-02-16: 500 [IU]

## 2021-02-16 MED ORDER — SODIUM CHLORIDE 0.9 % IV SOLN
1000.0000 mg/m2 | Freq: Once | INTRAVENOUS | Status: AC
  Administered 2021-02-16: 1710 mg via INTRAVENOUS
  Filled 2021-02-16: qty 44.97

## 2021-02-16 MED ORDER — SODIUM CHLORIDE 0.9 % IV SOLN: Freq: Once | INTRAVENOUS | Status: AC

## 2021-02-16 MED ORDER — SODIUM CHLORIDE 0.9% FLUSH
10.0000 mL | INTRAVENOUS | Status: DC | PRN
Start: 2021-02-16 — End: 2021-02-16
  Administered 2021-02-16: 10 mL

## 2021-02-16 MED ORDER — PACLITAXEL PROTEIN-BOUND CHEMO INJECTION 100 MG
100.0000 mg/m2 | Freq: Once | INTRAVENOUS | Status: AC
  Administered 2021-02-16: 175 mg via INTRAVENOUS
  Filled 2021-02-16: qty 35

## 2021-02-16 MED ORDER — PANTOPRAZOLE SODIUM 20 MG PO TBEC: 20.0000 mg | DELAYED_RELEASE_TABLET | Freq: Every day | ORAL | 1 refills | Status: DC

## 2021-02-16 MED ORDER — PROCHLORPERAZINE MALEATE 10 MG PO TABS
10.0000 mg | ORAL_TABLET | Freq: Once | ORAL | Status: AC
  Administered 2021-02-16: 10 mg via ORAL
  Filled 2021-02-16: qty 1

## 2021-02-16 MED ORDER — SODIUM CHLORIDE 0.9% FLUSH
10.0000 mL | Freq: Once | INTRAVENOUS | Status: AC
  Administered 2021-02-16: 10 mL

## 2021-02-16 NOTE — Patient Instructions (Signed)
[facility]   Discharge Instructions: Thank you for choosing [facility] to provide your oncology and hematology care.   If you have a lab appointment with the [facility], please go directly to the [facility] and check in at the registration area.   Wear comfortable clothing and clothing appropriate for easy access to any Portacath or PICC line.   We strive to give you quality time with your provider. You may need to reschedule your appointment if you arrive late (15 or more minutes).  Arriving late affects you and other patients whose appointments are after yours.  Also, if you miss three or more appointments without notifying the office, you may be dismissed from the clinic at the provider's discretion.      For prescription refill requests, have your pharmacy contact our office and allow 72 hours for refills to be completed.    Today you received the following chemotherapy and/or immunotherapy agents: paclitaxel-protein bound and gemcitabine.      To help prevent nausea and vomiting after your treatment, we encourage you to take your nausea medication as directed.  BELOW ARE SYMPTOMS THAT SHOULD BE REPORTED IMMEDIATELY: *FEVER GREATER THAN 100.4 F (38 C) OR HIGHER *CHILLS OR SWEATING *NAUSEA AND VOMITING THAT IS NOT CONTROLLED WITH YOUR NAUSEA MEDICATION *UNUSUAL SHORTNESS OF BREATH *UNUSUAL BRUISING OR BLEEDING *URINARY PROBLEMS (pain or burning when urinating, or frequent urination) *BOWEL PROBLEMS (unusual diarrhea, constipation, pain near the anus) TENDERNESS IN MOUTH AND THROAT WITH OR WITHOUT PRESENCE OF ULCERS (sore throat, sores in mouth, or a toothache) UNUSUAL RASH, SWELLING OR PAIN  UNUSUAL VAGINAL DISCHARGE OR ITCHING   Items with * indicate a potential emergency and should be followed up as soon as possible or go to the Emergency Department if any problems should occur.  Please show the CHEMOTHERAPY ALERT CARD or  IMMUNOTHERAPY ALERT CARD at check-in to the Emergency Department and triage nurse.  Should you have questions after your visit or need to cancel or reschedule your appointment, please contact [facility]  Dept: [phone]  and follow the prompts.  Office hours are 8:00 a.m. to 4:30 p.m. Monday - Friday. Please note that voicemails left after 4:00 p.m. may not be returned until the following business day.  We are closed weekends and major holidays. You have access to a nurse at all times for urgent questions. Please call the main number to the clinic Dept: [phone] and follow the prompts.   For any non-urgent questions, you may also contact your provider using MyChart. We now offer e-Visits for anyone 34 and older to request care online for non-urgent symptoms. For details visit [email or web address].   Also download the MyChart app! Go to the app store, search "MyChart", open the app, select [facility], and log in with your MyChart username and password.  Due to Covid, a mask is required upon entering the hospital/clinic. If you do not have a mask, one will be given to you upon arrival. For doctor visits, patients may have 1 support person aged 86 or older with them. For treatment visits, patients cannot have anyone with them due to current Covid guidelines and our immunocompromised population.

## 2021-02-17 ENCOUNTER — Telehealth: Payer: Self-pay | Admitting: Hematology

## 2021-02-17 NOTE — Telephone Encounter (Signed)
Scheduled follow-up appointments per 10/31 los. Patient's daughter is aware. 

## 2021-02-23 ENCOUNTER — Other Ambulatory Visit: Payer: Self-pay

## 2021-02-23 ENCOUNTER — Inpatient Hospital Stay: Payer: Medicare Other | Attending: Hematology

## 2021-02-23 ENCOUNTER — Ambulatory Visit: Payer: Medicare Other

## 2021-02-23 ENCOUNTER — Inpatient Hospital Stay: Payer: Medicare Other

## 2021-02-23 VITALS — BP 121/81 | HR 88 | Temp 98.2°F | Resp 18 | Wt 134.2 lb

## 2021-02-23 DIAGNOSIS — Z5111 Encounter for antineoplastic chemotherapy: Secondary | ICD-10-CM | POA: Insufficient documentation

## 2021-02-23 DIAGNOSIS — Z95828 Presence of other vascular implants and grafts: Secondary | ICD-10-CM

## 2021-02-23 DIAGNOSIS — C250 Malignant neoplasm of head of pancreas: Secondary | ICD-10-CM

## 2021-02-23 DIAGNOSIS — Z5189 Encounter for other specified aftercare: Secondary | ICD-10-CM | POA: Diagnosis not present

## 2021-02-23 LAB — CMP (CANCER CENTER ONLY)
ALT: 22 U/L (ref 0–44)
AST: 39 U/L (ref 15–41)
Albumin: 3 g/dL — ABNORMAL LOW (ref 3.5–5.0)
Alkaline Phosphatase: 158 U/L — ABNORMAL HIGH (ref 38–126)
Anion gap: 7 (ref 5–15)
BUN: 8 mg/dL (ref 8–23)
CO2: 26 mmol/L (ref 22–32)
Calcium: 8.9 mg/dL (ref 8.9–10.3)
Chloride: 105 mmol/L (ref 98–111)
Creatinine: 0.79 mg/dL (ref 0.44–1.00)
GFR, Estimated: >60 mL/min (ref >60)
Glucose, Bld: 126 mg/dL — ABNORMAL HIGH (ref 70–99)
Potassium: 4.4 mmol/L (ref 3.5–5.1)
Sodium: 138 mmol/L (ref 135–145)
Total Bilirubin: 0.3 mg/dL (ref 0.3–1.2)
Total Protein: 6.6 g/dL (ref 6.5–8.1)

## 2021-02-23 LAB — CBC WITH DIFFERENTIAL (CANCER CENTER ONLY)
Abs Immature Granulocytes: 0.01 10*3/uL (ref 0.00–0.07)
Basophils Absolute: 0 10*3/uL (ref 0.0–0.1)
Basophils Relative: 1 %
Eosinophils Absolute: 0.1 10*3/uL (ref 0.0–0.5)
Eosinophils Relative: 3 %
HCT: 26.8 % — ABNORMAL LOW (ref 36.0–46.0)
Hemoglobin: 8.5 g/dL — ABNORMAL LOW (ref 12.0–15.0)
Immature Granulocytes: 0 %
Lymphocytes Relative: 41 %
Lymphs Abs: 1.2 10*3/uL (ref 0.7–4.0)
MCH: 27.8 pg (ref 26.0–34.0)
MCHC: 31.7 g/dL (ref 30.0–36.0)
MCV: 87.6 fL (ref 80.0–100.0)
Monocytes Absolute: 0.2 10*3/uL (ref 0.1–1.0)
Monocytes Relative: 8 %
Neutro Abs: 1.4 10*3/uL — ABNORMAL LOW (ref 1.7–7.7)
Neutrophils Relative %: 47 %
Platelet Count: 209 10*3/uL (ref 150–400)
RBC: 3.06 MIL/uL — ABNORMAL LOW (ref 3.87–5.11)
RDW: 15.2 % (ref 11.5–15.5)
WBC Count: 3 10*3/uL — ABNORMAL LOW (ref 4.0–10.5)
nRBC: 0 % (ref 0.0–0.2)

## 2021-02-23 MED ORDER — HEPARIN SOD (PORK) LOCK FLUSH 100 UNIT/ML IV SOLN
500.0000 [IU] | Freq: Once | INTRAVENOUS | Status: AC | PRN
  Administered 2021-02-23: 500 [IU]

## 2021-02-23 MED ORDER — SODIUM CHLORIDE 0.9 % IV SOLN
1000.0000 mg/m2 | Freq: Once | INTRAVENOUS | Status: AC
  Administered 2021-02-23: 1710 mg via INTRAVENOUS
  Filled 2021-02-23: qty 44.97

## 2021-02-23 MED ORDER — PACLITAXEL PROTEIN-BOUND CHEMO INJECTION 100 MG
100.0000 mg/m2 | Freq: Once | INTRAVENOUS | Status: AC
  Administered 2021-02-23: 175 mg via INTRAVENOUS
  Filled 2021-02-23: qty 35

## 2021-02-23 MED ORDER — PROCHLORPERAZINE MALEATE 10 MG PO TABS
10.0000 mg | ORAL_TABLET | Freq: Once | ORAL | Status: AC
  Administered 2021-02-23: 10 mg via ORAL
  Filled 2021-02-23: qty 1

## 2021-02-23 MED ORDER — SODIUM CHLORIDE 0.9% FLUSH
10.0000 mL | Freq: Once | INTRAVENOUS | Status: AC
  Administered 2021-02-23: 10 mL

## 2021-02-23 MED ORDER — DIPHENHYDRAMINE HCL 25 MG PO CAPS
50.0000 mg | ORAL_CAPSULE | Freq: Once | ORAL | Status: AC
  Administered 2021-02-23: 50 mg via ORAL
  Filled 2021-02-23: qty 2

## 2021-02-23 MED ORDER — FAMOTIDINE 20 MG IN NS 100 ML IVPB
20.0000 mg | Freq: Once | INTRAVENOUS | Status: AC
  Administered 2021-02-23: 20 mg via INTRAVENOUS
  Filled 2021-02-23: qty 100

## 2021-02-23 MED ORDER — SODIUM CHLORIDE 0.9 % IV SOLN: Freq: Once | INTRAVENOUS | Status: AC

## 2021-02-23 MED ORDER — SODIUM CHLORIDE 0.9% FLUSH
10.0000 mL | INTRAVENOUS | Status: DC | PRN
  Administered 2021-02-23: 10 mL

## 2021-02-23 NOTE — Progress Notes (Signed)
Per Dr. Burr Medico, "OK To Treat w/ANC 1.4 today"

## 2021-02-23 NOTE — Patient Instructions (Signed)
[facility]  Discharge Instructions: Thank you for choosing [facility] to provide your oncology and hematology care.   If you have a lab appointment with the [facility], please go directly to the [facility] and check in at the registration area.   Wear comfortable clothing and clothing appropriate for easy access to any Portacath or PICC line.   We strive to give you quality time with your provider. You may need to reschedule your appointment if you arrive late (15 or more minutes).  Arriving late affects you and other patients whose appointments are after yours.  Also, if you miss three or more appointments without notifying the office, you may be dismissed from the clinic at the provider's discretion.      For prescription refill requests, have your pharmacy contact our office and allow 72 hours for refills to be completed.    Today you received the following chemotherapy and/or immunotherapy agents: Paclitaxel and gemcitabine      To help prevent nausea and vomiting after your treatment, we encourage you to take your nausea medication as directed.  BELOW ARE SYMPTOMS THAT SHOULD BE REPORTED IMMEDIATELY: *FEVER GREATER THAN 100.4 F (38 C) OR HIGHER *CHILLS OR SWEATING *NAUSEA AND VOMITING THAT IS NOT CONTROLLED WITH YOUR NAUSEA MEDICATION *UNUSUAL SHORTNESS OF BREATH *UNUSUAL BRUISING OR BLEEDING *URINARY PROBLEMS (pain or burning when urinating, or frequent urination) *BOWEL PROBLEMS (unusual diarrhea, constipation, pain near the anus) TENDERNESS IN MOUTH AND THROAT WITH OR WITHOUT PRESENCE OF ULCERS (sore throat, sores in mouth, or a toothache) UNUSUAL RASH, SWELLING OR PAIN  UNUSUAL VAGINAL DISCHARGE OR ITCHING   Items with * indicate a potential emergency and should be followed up as soon as possible or go to the Emergency Department if any problems should occur.  Please show the CHEMOTHERAPY ALERT CARD or IMMUNOTHERAPY ALERT  CARD at check-in to the Emergency Department and triage nurse.  Should you have questions after your visit or need to cancel or reschedule your appointment, please contact [facility]  Dept: [phone]  and follow the prompts.  Office hours are 8:00 a.m. to 4:30 p.m. Monday - Friday. Please note that voicemails left after 4:00 p.m. may not be returned until the following business day.  We are closed weekends and major holidays. You have access to a nurse at all times for urgent questions. Please call the main number to the clinic Dept: [phone] and follow the prompts.   For any non-urgent questions, you may also contact your provider using MyChart. We now offer e-Visits for anyone 24 and older to request care online for non-urgent symptoms. For details visit [email or web address].   Also download the MyChart app! Go to the app store, search "MyChart", open the app, select [facility], and log in with your MyChart username and password.  Due to Covid, a mask is required upon entering the hospital/clinic. If you do not have a mask, one will be given to you upon arrival. For doctor visits, patients may have 1 support person aged 80 or older with them. For treatment visits, patients cannot have anyone with them due to current Covid guidelines and our immunocompromised population.

## 2021-02-24 ENCOUNTER — Other Ambulatory Visit: Payer: Self-pay | Admitting: Hematology

## 2021-02-24 ENCOUNTER — Telehealth: Payer: Self-pay

## 2021-02-24 LAB — CANCER ANTIGEN 19-9: CA 19-9: 9 U/mL (ref 0–35)

## 2021-02-24 NOTE — Telephone Encounter (Signed)
Spoke with pt's spouse via telephone d/t pt is HOH regarding pt's appt on Monday 03/02/2021 for GSCFs since pt's [place] was 1.4 on 02/23/2021 (prior to chemotherapy).  Pt's spouse confirmed appt for Monday 03/02/2021.

## 2021-03-02 ENCOUNTER — Ambulatory Visit: Payer: Medicare Other | Admitting: Hematology

## 2021-03-02 ENCOUNTER — Other Ambulatory Visit: Payer: Self-pay | Admitting: Hematology

## 2021-03-02 ENCOUNTER — Other Ambulatory Visit: Payer: Self-pay

## 2021-03-02 ENCOUNTER — Inpatient Hospital Stay: Payer: Medicare Other

## 2021-03-02 ENCOUNTER — Other Ambulatory Visit: Payer: Medicare Other

## 2021-03-02 ENCOUNTER — Ambulatory Visit: Payer: Medicare Other

## 2021-03-02 ENCOUNTER — Encounter: Payer: Self-pay | Admitting: Hematology

## 2021-03-02 VITALS — BP 130/73 | HR 82 | Temp 98.0°F | Resp 18

## 2021-03-02 DIAGNOSIS — Z5189 Encounter for other specified aftercare: Secondary | ICD-10-CM | POA: Diagnosis not present

## 2021-03-02 DIAGNOSIS — C250 Malignant neoplasm of head of pancreas: Secondary | ICD-10-CM

## 2021-03-02 DIAGNOSIS — Z5111 Encounter for antineoplastic chemotherapy: Secondary | ICD-10-CM | POA: Diagnosis not present

## 2021-03-02 DIAGNOSIS — Z95828 Presence of other vascular implants and grafts: Secondary | ICD-10-CM

## 2021-03-02 DIAGNOSIS — Z7689 Persons encountering health services in other specified circumstances: Secondary | ICD-10-CM

## 2021-03-02 LAB — CBC WITH DIFFERENTIAL (CANCER CENTER ONLY)
Abs Immature Granulocytes: 0.01 10*3/uL (ref 0.00–0.07)
Basophils Absolute: 0 10*3/uL (ref 0.0–0.1)
Basophils Relative: 0 %
Eosinophils Absolute: 0.1 10*3/uL (ref 0.0–0.5)
Eosinophils Relative: 2 %
HCT: 24.8 % — ABNORMAL LOW (ref 36.0–46.0)
Hemoglobin: 7.9 g/dL — ABNORMAL LOW (ref 12.0–15.0)
Immature Granulocytes: 0 %
Lymphocytes Relative: 43 %
Lymphs Abs: 1.1 10*3/uL (ref 0.7–4.0)
MCH: 27.8 pg (ref 26.0–34.0)
MCHC: 31.9 g/dL (ref 30.0–36.0)
MCV: 87.3 fL (ref 80.0–100.0)
Monocytes Absolute: 0.1 10*3/uL (ref 0.1–1.0)
Monocytes Relative: 4 %
Neutro Abs: 1.3 10*3/uL — ABNORMAL LOW (ref 1.7–7.7)
Neutrophils Relative %: 51 %
Platelet Count: 147 10*3/uL — ABNORMAL LOW (ref 150–400)
RBC: 2.84 MIL/uL — ABNORMAL LOW (ref 3.87–5.11)
RDW: 15.3 % (ref 11.5–15.5)
WBC Count: 2.5 10*3/uL — ABNORMAL LOW (ref 4.0–10.5)
nRBC: 0 % (ref 0.0–0.2)

## 2021-03-02 MED ORDER — FILGRASTIM-SNDZ 300 MCG/0.5ML IJ SOSY: 300.0000 ug | PREFILLED_SYRINGE | Freq: Once | INTRAMUSCULAR | Status: DC

## 2021-03-02 MED ORDER — HEPARIN SOD (PORK) LOCK FLUSH 100 UNIT/ML IV SOLN
500.0000 [IU] | Freq: Once | INTRAVENOUS | Status: AC
  Administered 2021-03-02: 500 [IU]

## 2021-03-02 MED ORDER — SODIUM CHLORIDE 0.9% FLUSH
10.0000 mL | Freq: Once | INTRAVENOUS | Status: AC
  Administered 2021-03-02: 10 mL

## 2021-03-02 NOTE — Progress Notes (Signed)
Covid booster not administered today due to neutropenia. Zarxio not administered, pending approval from [facility]. CBC drawn per vo from Dr. Burr Medico to spot check ANC. Dr. Ernestina Penna desk nurse aware and will cal patient to f/u with appropriate appt. Later this week.

## 2021-03-02 NOTE — Progress Notes (Signed)
Gift card approved and given by me for service recovery for inconveniences from Friday and today. Request was made by RN.

## 2021-03-03 ENCOUNTER — Telehealth: Payer: Self-pay | Admitting: *Deleted

## 2021-03-03 ENCOUNTER — Other Ambulatory Visit: Payer: Self-pay | Admitting: *Deleted

## 2021-03-03 DIAGNOSIS — C250 Malignant neoplasm of head of pancreas: Secondary | ICD-10-CM

## 2021-03-03 DIAGNOSIS — Z7689 Persons encountering health services in other specified circumstances: Secondary | ICD-10-CM

## 2021-03-03 NOTE — Telephone Encounter (Signed)
Per Dr. Burr Medico, called and spoke with pt daughter Levada Dy due to pt being hearing impaired. Advised pt daughter of message below. Educated daughter on using neutropenic precautions with her mother. Pt daughter verbalized understanding. Sample to blood bank was added for pt next lab appt

## 2021-03-03 NOTE — Telephone Encounter (Signed)
-----   Message from [name], MD sent at 03/03/2021  7:17 AM EST ----- Please let pt know her [place] is slightly low, not bad, OK to hold GCSF for now, please add "blood sample to blood bank" to next lab draw, thanks   YF

## 2021-03-03 NOTE — Telephone Encounter (Signed)
Called pt daughter to make aware of results. Pt daughter stated pt lower extremities are beginning to swell since last treatment as well as balance being off. Looking at pt med list, seen that pt is on lasix and another bp med. Advised pt daughter to call PCP to evaluate the swelling as well as buying pt compression hose and keeping feet elevated. Daughter did state that it is hard to keep her moms feet up and that she sits with feet dangling most of the day, also she doesn't have compression hose. I let her know that I will make Dr.Feng aware.

## 2021-03-04 ENCOUNTER — Telehealth: Payer: Self-pay

## 2021-03-04 ENCOUNTER — Other Ambulatory Visit: Payer: Self-pay | Admitting: Hematology

## 2021-03-04 ENCOUNTER — Other Ambulatory Visit: Payer: Self-pay

## 2021-03-04 DIAGNOSIS — M7989 Other specified soft tissue disorders: Secondary | ICD-10-CM | POA: Diagnosis not present

## 2021-03-04 DIAGNOSIS — E119 Type 2 diabetes mellitus without complications: Secondary | ICD-10-CM | POA: Diagnosis not present

## 2021-03-04 DIAGNOSIS — Z7689 Persons encountering health services in other specified circumstances: Secondary | ICD-10-CM

## 2021-03-04 DIAGNOSIS — R197 Diarrhea, unspecified: Secondary | ICD-10-CM | POA: Diagnosis not present

## 2021-03-04 DIAGNOSIS — C259 Malignant neoplasm of pancreas, unspecified: Secondary | ICD-10-CM | POA: Diagnosis not present

## 2021-03-04 NOTE — Progress Notes (Signed)
Zarxio 31mcg SubQ injection ordered and placed under Signed [facility] Held.  Pt to get Zarxio on 03/05/2021 @10am 

## 2021-03-04 NOTE — Telephone Encounter (Signed)
Spoke with pt's husband regarding appt for Zarxio injection scheduled on 03/05/2021.  Pt's spouse confirmed appt for 03/05/2021.

## 2021-03-05 ENCOUNTER — Inpatient Hospital Stay: Payer: Medicare Other

## 2021-03-05 ENCOUNTER — Other Ambulatory Visit: Payer: Self-pay

## 2021-03-05 VITALS — BP 125/76 | HR 84 | Temp 98.1°F | Resp 18

## 2021-03-05 DIAGNOSIS — Z5111 Encounter for antineoplastic chemotherapy: Secondary | ICD-10-CM | POA: Diagnosis not present

## 2021-03-05 DIAGNOSIS — Z95828 Presence of other vascular implants and grafts: Secondary | ICD-10-CM

## 2021-03-05 DIAGNOSIS — Z5189 Encounter for other specified aftercare: Secondary | ICD-10-CM | POA: Diagnosis not present

## 2021-03-05 DIAGNOSIS — C250 Malignant neoplasm of head of pancreas: Secondary | ICD-10-CM | POA: Diagnosis not present

## 2021-03-05 MED ORDER — FILGRASTIM-SNDZ 300 MCG/0.5ML IJ SOSY
300.0000 ug | PREFILLED_SYRINGE | Freq: Once | INTRAMUSCULAR | Status: AC
  Administered 2021-03-05: 11:00:00 300 ug via SUBCUTANEOUS
  Filled 2021-03-05: qty 0.5

## 2021-03-05 NOTE — Patient Instructions (Signed)
Filgrastim, G-CSF injection °What is this medication? °FILGRASTIM, G-CSF (fil GRA stim) is a granulocyte colony-stimulating factor that stimulates the growth of neutrophils, a type of white blood cell (WBC) important in the body's fight against infection. It is used to reduce the incidence of fever and infection in patients with certain types of cancer who are receiving chemotherapy that affects the bone marrow, to stimulate blood cell production for removal of WBCs from the body prior to a bone marrow transplantation, to reduce the incidence of fever and infection in patients who have severe chronic neutropenia, and to improve survival outcomes following high-dose radiation exposure that is toxic to the bone marrow. °This medicine may be used for other purposes; ask your health care provider or pharmacist if you have questions. °COMMON BRAND NAME(S): Neupogen, Nivestym, Releuko, Zarxio °What should I tell my care team before I take this medication? °They need to know if you have any of these conditions: °kidney disease °latex allergy °ongoing radiation therapy °sickle cell disease °an unusual or allergic reaction to filgrastim, pegfilgrastim, other medicines, foods, dyes, or preservatives °pregnant or trying to get pregnant °breast-feeding °How should I use this medication? °This medicine is for injection under the skin or infusion into a vein. As an infusion into a vein, it is usually given by a health care professional in a hospital or clinic setting. If you get this medicine at home, you will be taught how to prepare and give this medicine. Refer to the Instructions for Use that come with your medication packaging. Use exactly as directed. Take your medicine at regular intervals. Do not take your medicine more often than directed. °It is important that you put your used needles and syringes in a special sharps container. Do not put them in a trash can. If you do not have a sharps container, call your pharmacist  or healthcare provider to get one. °Talk to your pediatrician regarding the use of this medicine in children. While this drug may be prescribed for children as young as 7 months for selected conditions, precautions do apply. °Overdosage: If you think you have taken too much of this medicine contact a poison control center or emergency room at once. °NOTE: This medicine is only for you. Do not share this medicine with others. °What if I miss a dose? °It is important not to miss your dose. Call your doctor or health care professional if you miss a dose. °What may interact with this medication? °This medicine may interact with the following medications: °medicines that may cause a release of neutrophils, such as lithium °This list may not describe all possible interactions. Give your health care provider a list of all the medicines, herbs, non-prescription drugs, or dietary supplements you use. Also tell them if you smoke, drink alcohol, or use illegal drugs. Some items may interact with your medicine. °What should I watch for while using this medication? °Your condition will be monitored carefully while you are receiving this medicine. °You may need blood work done while you are taking this medicine. °Talk to your health care provider about your risk of cancer. You may be more at risk for certain types of cancer if you take this medicine. °What side effects may I notice from receiving this medication? °Side effects that you should report to your doctor or health care professional as soon as possible: °allergic reactions like skin rash, itching or hives, swelling of the face, lips, or tongue °back pain °dizziness or feeling faint °fever °pain, redness, or   irritation at site where injected °pinpoint red spots on the skin °shortness of breath or breathing problems °signs and symptoms of kidney injury like trouble passing urine, change in the amount of urine, or red or dark-brown urine °stomach or side pain, or pain at  the shoulder °swelling °tiredness °unusual bleeding or bruising °Side effects that usually do not require medical attention (report to your doctor or health care professional if they continue or are bothersome): °bone pain °cough °diarrhea °hair loss °headache °muscle pain °This list may not describe all possible side effects. Call your doctor for medical advice about side effects. You may report side effects to FDA at 1-800-FDA-1088. °Where should I keep my medication? °Keep out of the reach of children. °Store in a refrigerator between 2 and 8 degrees C (36 and 46 degrees F). Do not freeze. Keep in carton to protect from light. Throw away this medicine if vials or syringes are left out of the refrigerator for more than 24 hours. Throw away any unused medicine after the expiration date. °NOTE: This sheet is a summary. It may not cover all possible information. If you have questions about this medicine, talk to your doctor, pharmacist, or health care provider. °© 2022 [employer] (2020-12-23 00:00:00) ° °

## 2021-03-09 ENCOUNTER — Ambulatory Visit: Payer: Medicare Other

## 2021-03-09 ENCOUNTER — Inpatient Hospital Stay: Payer: Medicare Other

## 2021-03-09 ENCOUNTER — Other Ambulatory Visit: Payer: Self-pay

## 2021-03-09 ENCOUNTER — Encounter: Payer: Self-pay | Admitting: Hematology

## 2021-03-09 ENCOUNTER — Inpatient Hospital Stay (HOSPITAL_BASED_OUTPATIENT_CLINIC_OR_DEPARTMENT_OTHER): Payer: Medicare Other | Admitting: Hematology

## 2021-03-09 ENCOUNTER — Other Ambulatory Visit: Payer: Medicare Other

## 2021-03-09 VITALS — BP 144/72 | HR 91 | Temp 98.2°F | Resp 18 | Ht 66.0 in | Wt 135.8 lb

## 2021-03-09 DIAGNOSIS — C250 Malignant neoplasm of head of pancreas: Secondary | ICD-10-CM

## 2021-03-09 DIAGNOSIS — Z5189 Encounter for other specified aftercare: Secondary | ICD-10-CM | POA: Diagnosis not present

## 2021-03-09 DIAGNOSIS — Z7689 Persons encountering health services in other specified circumstances: Secondary | ICD-10-CM

## 2021-03-09 DIAGNOSIS — Z5111 Encounter for antineoplastic chemotherapy: Secondary | ICD-10-CM | POA: Diagnosis not present

## 2021-03-09 DIAGNOSIS — Z95828 Presence of other vascular implants and grafts: Secondary | ICD-10-CM

## 2021-03-09 LAB — CBC WITH DIFFERENTIAL (CANCER CENTER ONLY)
Abs Immature Granulocytes: 0.28 10*3/uL — ABNORMAL HIGH (ref 0.00–0.07)
Basophils Absolute: 0 10*3/uL (ref 0.0–0.1)
Basophils Relative: 1 %
Eosinophils Absolute: 0.1 10*3/uL (ref 0.0–0.5)
Eosinophils Relative: 2 %
HCT: 27.5 % — ABNORMAL LOW (ref 36.0–46.0)
Hemoglobin: 8.7 g/dL — ABNORMAL LOW (ref 12.0–15.0)
Immature Granulocytes: 5 %
Lymphocytes Relative: 24 %
Lymphs Abs: 1.4 10*3/uL (ref 0.7–4.0)
MCH: 29.1 pg (ref 26.0–34.0)
MCHC: 31.6 g/dL (ref 30.0–36.0)
MCV: 92 fL (ref 80.0–100.0)
Monocytes Absolute: 0.7 10*3/uL (ref 0.1–1.0)
Monocytes Relative: 13 %
Neutro Abs: 3.2 10*3/uL (ref 1.7–7.7)
Neutrophils Relative %: 55 %
Platelet Count: 376 10*3/uL (ref 150–400)
RBC: 2.99 MIL/uL — ABNORMAL LOW (ref 3.87–5.11)
RDW: 17.3 % — ABNORMAL HIGH (ref 11.5–15.5)
WBC Count: 5.7 10*3/uL (ref 4.0–10.5)
nRBC: 0.4 % — ABNORMAL HIGH (ref 0.0–0.2)

## 2021-03-09 LAB — CMP (CANCER CENTER ONLY)
ALT: 18 U/L (ref 0–44)
AST: 24 U/L (ref 15–41)
Albumin: 3.2 g/dL — ABNORMAL LOW (ref 3.5–5.0)
Alkaline Phosphatase: 113 U/L (ref 38–126)
Anion gap: 7 (ref 5–15)
BUN: 11 mg/dL (ref 8–23)
CO2: 25 mmol/L (ref 22–32)
Calcium: 8.4 mg/dL — ABNORMAL LOW (ref 8.9–10.3)
Chloride: 107 mmol/L (ref 98–111)
Creatinine: 0.87 mg/dL (ref 0.44–1.00)
GFR, Estimated: >60 mL/min (ref >60)
Glucose, Bld: 208 mg/dL — ABNORMAL HIGH (ref 70–99)
Potassium: 4.5 mmol/L (ref 3.5–5.1)
Sodium: 139 mmol/L (ref 135–145)
Total Bilirubin: <0.2 mg/dL — ABNORMAL LOW (ref 0.3–1.2)
Total Protein: 6.5 g/dL (ref 6.5–8.1)

## 2021-03-09 LAB — SAMPLE TO BLOOD BANK

## 2021-03-09 MED ORDER — PROCHLORPERAZINE MALEATE 10 MG PO TABS
10.0000 mg | ORAL_TABLET | Freq: Once | ORAL | Status: AC
  Administered 2021-03-09: 10 mg via ORAL
  Filled 2021-03-09: qty 1

## 2021-03-09 MED ORDER — HEPARIN SOD (PORK) LOCK FLUSH 100 UNIT/ML IV SOLN
500.0000 [IU] | Freq: Once | INTRAVENOUS | Status: AC | PRN
  Administered 2021-03-09: 500 [IU]

## 2021-03-09 MED ORDER — SODIUM CHLORIDE 0.9% FLUSH
10.0000 mL | INTRAVENOUS | Status: DC | PRN
  Administered 2021-03-09: 10 mL

## 2021-03-09 MED ORDER — SODIUM CHLORIDE 0.9 % IV SOLN: Freq: Once | INTRAVENOUS | Status: AC

## 2021-03-09 MED ORDER — DIPHENHYDRAMINE HCL 25 MG PO CAPS
50.0000 mg | ORAL_CAPSULE | Freq: Once | ORAL | Status: AC
  Administered 2021-03-09: 50 mg via ORAL
  Filled 2021-03-09: qty 2

## 2021-03-09 MED ORDER — FAMOTIDINE 20 MG IN NS 100 ML IVPB
20.0000 mg | Freq: Once | INTRAVENOUS | Status: AC
  Administered 2021-03-09: 20 mg via INTRAVENOUS
  Filled 2021-03-09: qty 100

## 2021-03-09 MED ORDER — SODIUM CHLORIDE 0.9 % IV SOLN
1000.0000 mg/m2 | Freq: Once | INTRAVENOUS | Status: AC
  Administered 2021-03-09: 1710 mg via INTRAVENOUS
  Filled 2021-03-09: qty 44.97

## 2021-03-09 MED ORDER — SODIUM CHLORIDE 0.9% FLUSH
10.0000 mL | Freq: Once | INTRAVENOUS | Status: AC
  Administered 2021-03-09: 10 mL

## 2021-03-09 MED ORDER — PACLITAXEL PROTEIN-BOUND CHEMO INJECTION 100 MG
100.0000 mg/m2 | Freq: Once | INTRAVENOUS | Status: AC
  Administered 2021-03-09: 175 mg via INTRAVENOUS
  Filled 2021-03-09: qty 35

## 2021-03-09 NOTE — Patient Instructions (Signed)
[facility]  Discharge Instructions: Thank you for choosing [facility] to provide your oncology and hematology care.   If you have a lab appointment with the [facility], please go directly to the [facility] and check in at the registration area.   Wear comfortable clothing and clothing appropriate for easy access to any Portacath or PICC line.   We strive to give you quality time with your provider. You may need to reschedule your appointment if you arrive late (15 or more minutes).  Arriving late affects you and other patients whose appointments are after yours.  Also, if you miss three or more appointments without notifying the office, you may be dismissed from the clinic at the provider's discretion.      For prescription refill requests, have your pharmacy contact our office and allow 72 hours for refills to be completed.    Today you received the following chemotherapy and/or immunotherapy agents Abraxane/Gemzar      To help prevent nausea and vomiting after your treatment, we encourage you to take your nausea medication as directed.  BELOW ARE SYMPTOMS THAT SHOULD BE REPORTED IMMEDIATELY: *FEVER GREATER THAN 100.4 F (38 C) OR HIGHER *CHILLS OR SWEATING *NAUSEA AND VOMITING THAT IS NOT CONTROLLED WITH YOUR NAUSEA MEDICATION *UNUSUAL SHORTNESS OF BREATH *UNUSUAL BRUISING OR BLEEDING *URINARY PROBLEMS (pain or burning when urinating, or frequent urination) *BOWEL PROBLEMS (unusual diarrhea, constipation, pain near the anus) TENDERNESS IN MOUTH AND THROAT WITH OR WITHOUT PRESENCE OF ULCERS (sore throat, sores in mouth, or a toothache) UNUSUAL RASH, SWELLING OR PAIN  UNUSUAL VAGINAL DISCHARGE OR ITCHING   Items with * indicate a potential emergency and should be followed up as soon as possible or go to the Emergency Department if any problems should occur.  Please show the CHEMOTHERAPY ALERT CARD or IMMUNOTHERAPY ALERT CARD at  check-in to the Emergency Department and triage nurse.  Should you have questions after your visit or need to cancel or reschedule your appointment, please contact [facility]  Dept: [phone]  and follow the prompts.  Office hours are 8:00 a.m. to 4:30 p.m. Monday - Friday. Please note that voicemails left after 4:00 p.m. may not be returned until the following business day.  We are closed weekends and major holidays. You have access to a nurse at all times for urgent questions. Please call the main number to the clinic Dept: [phone] and follow the prompts.   For any non-urgent questions, you may also contact your provider using MyChart. We now offer e-Visits for anyone 67 and older to request care online for non-urgent symptoms. For details visit [email or web address].   Also download the MyChart app! Go to the app store, search "MyChart", open the app, select [facility], and log in with your MyChart username and password.  Due to Covid, a mask is required upon entering the hospital/clinic. If you do not have a mask, one will be given to you upon arrival. For doctor visits, patients may have 1 support person aged 72 or older with them. For treatment visits, patients cannot have anyone with them due to current Covid guidelines and our immunocompromised population.

## 2021-03-09 NOTE — Progress Notes (Signed)
[name]   [phone] [phone]   Clinic Follow up Note   Patient Care Team: [name], DO as PCP - General (Family Medicine) [name], RN (Inactive) as Oncology Nurse Navigator [name], MD as Consulting Physician (Oncology) [name], MD as Consulting Physician (Gastroenterology)  Date of Service:  03/09/2021  CHIEF COMPLAINT: f/u of pancreatic cancer  CURRENT THERAPY:  First-line Gemcitabine and Abraxane every 2 weeks starting 10/07/20. C1 given with Gemcitabine alone. Changed to 2 weeks on/1 week off 01/19/21  ASSESSMENT & PLAN:  Tina Patel is a 80 y.o. female with   1. 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 Her initial EUS on 09/19/20 showed mass at head of pancreas, but biopsy was negative. She had ERCP stent placement same day. -Her repeat EUS biopsy of the mass on 09/26/20 showed adenocarcinoma from pancreatic primary. Her CT CAP showed mass to be 4.5cm. No evidence of metastatic disease in the chest, abdomen, or pelvis. She does have multiple benign cystic lesions in the liver.   -Per GI tumor board discussion, neoadjuvant chemotherapy was recommended and she was referred to Dr. Carmelina Noun at [facility] for surgical consideration.   -She began gemcitabine 2 weeks on/1 week off on 10/07/2020, Abraxane was added with cycle 2 and changed to every 2 weeks.  She has been tolerating very well with relatively no side effects  -Restaging CT 01/06/2021 at [facility] showed similar pancreatic head mass with unchanged associated vascular involvement and stable liver lesions which remain indeterminate.  No evidence of progression.  Dr. Mariah Milling recommended an additional 3 months of chemo -Given her good tolerance and high performance status, Dr. Burr Medico recommended to intensify to gem/Abraxane 2 weeks on/1 week off she started 01/19/2021 -she  has developed significant fatigue and leg weakness. We will give her a bit of a break around the holidays. Will change her treatment to every 2 weeks for now -will ask Dr. Mariah Milling to see her in Dec with restaging CT    2. Symptom Management: Weight loss, Fatigue, Diarrhea -For the past 3-6 months she has had 25 pounds weight loss and more fatigued.  -She has low appetite, nausea and mild early satiety. She forces herself to remain active at home. -Continue mirtazapine and supplements. I recommend Glucerna given her diabetes. -she also has diarrhea. I recommend imodium first. -Follow-up with dietitian -Stable   3. 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 Genetic Testing -Given her pancreatic cancer she is eligible for genetic testing. She is interested.    5. Social Support -She has great social support from her husband 80 yo) who she lives with and her 3 children who live in town.     PLAN: -Lab reviewed, will proceed chemotherapy gemcitabine and Abraxane today, off next week -Lab, follow-up and next cycle chemo in 2 weeks   No problem-specific Assessment & Plan notes found for this encounter.   SUMMARY OF ONCOLOGIC HISTORY: Oncology History Overview Note  Cancer Staging Pancreatic cancer [facility]) Staging form: Exocrine Pancreas, AJCC 8th Edition - Clinical stage from 09/26/2020: Stage IB (cT2, cN0, cM0) - Signed by [name], MD on 09/29/2020 Stage prefix: Initial diagnosis Total positive nodes: 0    Pancreatic cancer ([place])  09/18/2020 Imaging   US Abdomen  IMPRESSION: Intrahepatic and extrahepatic biliary ductal dilatation. Common bile duct dilated up to 13 mm. This could be further evaluated  with MRCP or ERCP.   Sludge within the gallbladder with associated gallbladder wall thickening and pericholecystic fluid. Cannot exclude acute cholecystitis.   Multiple hepatic cysts. Complex cystic area posteriorly in the right hepatic lobe. This could also be  further evaluated with MRI.   09/19/2020 Procedure   EUS and ERCP by Dr Benson Norway   EUS IMPRESSION - A mass was identified in the pancreatic head. The staging applies if malignancy is confirmed. Fine needle aspiration performed. - There was dilation in the common hepatic duct which measured up to 13 mm. - There was dilation in the intrahepatic bile ducts, diffusely. - A cyst was found in the left lobe of the liver and measured 24 mm by 15 mm. - Endosonographic images of the left adrenal gland were unremarkable.  ERCP IMPRESSION - The major papilla appeared normal. - A biliary sphincterotomy was performed. - Cells for cytology obtained distal CBD. - One covered metal stent was placed into the common bile duct.     09/19/2020 Pathology Results   A. PANCREAS, HEAD, FINE NEEDLE ASPIRATION:  - Benign reactive/reparative changes  - Bland spindle cell fragments   DIAGNOSTIC COMMENTS:  There are fragments of bland spindle cells, favored to represent smooth muscle tissue. The glandular fragments are also bland and thus, there is no definitive malignancy identified   B. BILIARY, STRICTURE, BRUSHING:  - Benign reactive/reparative changes   09/20/2020 Imaging   MRI Abdomen  IMPRESSION: 1. 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 Recommend ERCP/EUS or tissue sampling. 2. There is mild biliary ductal dilatation of the common hepatic bile duct and the intrahepatic bile ducts in the LEFT hepatic lobe. Sequences are severely degraded by patient motion. Concern for obstruction of the common bile duct by the pancreatic head mass. 3. Multiple cystic lesions throughout the liver parenchyma. Assessment of enhancement is difficult due to patient motion as well as no true noncontrast T1 weighted imaging as described above. Favor complex benign cysts but consider liver protocol contrast CT for further evaluation  of the cystic lesions to exclude metastasis.   09/21/2020 Imaging   CT CAP  IMPRESSION: 1. Heterogeneous mass of the central pancreatic head measuring 4.5 x 3.3 cm. There is atrophy of the distal pancreatic parenchyma with diffuse pancreatic ductal dilatation up to 0.8 cm. Mass appears to closely abut the lateral surface of the portal vein near the confluence. The superior mesenteric artery is well separated by a fat plane, as is the hepatic artery. 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 The exact borders and size of the mass are better delineated by prior MR. 2. No evidence of metastatic disease in the chest, abdomen, or pelvis. 3. There are numerous lobulated, fluid attenuating cysts throughout the hepatic parenchyma, numerous additional subcentimeter lesions are too small to characterize although demonstrate no evidence of contrast enhancement and are likely additional subcentimeter cysts. Attention on follow-up. 4. Status post common bile duct stent with post stenting pneumobilia. 5. Thickening and fat stranding about the gallbladder as well as about the adjacent duodenal bulb and descending duodenum, of uncertain etiology, concerning for cholecystitis, enteritis, and/or pancreatitis, possibly related to recent endoscopic procedure. Aortic Atherosclerosis (ICD10-I70.0).   09/26/2020 Cancer Staging   Staging form: Exocrine Pancreas, AJCC 8th Edition - Clinical stage from 09/26/2020: Stage IIA (cT3, cN0, cM0) - Signed by [name], MD on 09/29/2020 Stage prefix: Initial diagnosis Total positive nodes: 0    09/26/2020 Procedure  EUS by Dr Benson Norway  IMPRESSION - A mass was identified in the pancreatic head. Cytology results are pending. However, the endosonographic appearance is consistent with adenocarcinoma. Fine needle aspiration performed.   09/26/2020 Initial Biopsy   A. PANCREAS, HEAD, FINE NEEDLE ASPIRATION:  - Malignant cells consistent with adenocarcinoma     09/29/2020 Initial Diagnosis   Pancreatic cancer ([place])   10/07/2020 -  Chemotherapy   First-line Gemcitabine and Abraxane 2 weeks on/1 week off starting 10/07/20.  C1 given with Gemcitabine alone.  Abraxane added with cycle 2 and changed to every 2 weeks    Genetic Testing   Negative genetic testing. No pathogenic variants identified on the [facility] CancerNext-Expanded+RNA panel. The report date is 10/16/2020.  The CancerNext-Expanded + RNAinsight gene panel offered by [employer] and includes sequencing and rearrangement analysis for the following 77 genes: IP, ALK, APC*, ATM*, AXIN2, BAP1, BARD1, BLM, BMPR1A, BRCA1*, BRCA2*, BRIP1*, CDC73, CDH1*,CDK4, CDKN1B, CDKN2A, CHEK2*, CTNNA1, DICER1, FANCC, FH, FLCN, GALNT12, KIF1B, LZTR1, MAX, MEN1, MET, MLH1*, MSH2*, MSH3, MSH6*, MUTYH*, NBN, NF1*, NF2, NTHL1, PALB2*, PHOX2B, PMS2*, POT1, PRKAR1A, PTCH1, PTEN*, RAD51C*, RAD51D*,RB1, RECQL, RET, SDHA, SDHAF2, SDHB, SDHC, SDHD, SMAD4, SMARCA4, SMARCB1, SMARCE1, STK11, SUFU, TMEM127, TP53*,TSC1, TSC2, VHL and XRCC2 (sequencing and deletion/duplication); EGFR, EGLN1, HOXB13, KIT, MITF, PDGFRA, POLD1 and POLE (sequencing only); EPCAM and GREM1 (deletion/duplication only).   01/06/2021 Imaging   CT CAP at [facility] Impression:  1.  Similar pancreatic head mass with unchanged associated vascular involvement and pancreatic ductal dilatation.  2.  Stable multifocal hypodense liver lesions, some of which are indeterminate. Consider MR liver with and without IV contrast for further evaluation.  3.  No evidence of new or increasing metastatic disease in the chest, abdomen or pelvis.       INTERVAL HISTORY:  Tina Patel is here for a follow up of pancreatic cancer. She was last seen by NP Lacie on 02/16/21. She presents to the clinic accompanied by her husband. He reports she is getting around the house, sometimes with her walker. He denies any falls. He also reports she is having diarrhea.   All other systems were  reviewed with the patient and are negative.  MEDICAL HISTORY:  Past Medical History:  Diagnosis Date   Diabetes ([place])    Diabetes mellitus    Family history of colon cancer    Family history of lung cancer    Family history of multiple myeloma    Family history of prostate cancer    High cholesterol    Hypertension    Hypothyroid     SURGICAL HISTORY: Past Surgical History:  Procedure Laterality Date   BILIARY BRUSHING  09/19/2020   Procedure: BILIARY BRUSHING;  Surgeon: [name], MD;  Location: [facility] END[facility]COPY;  Service: Endoscopy;;   BILIARY STENT PLACEMENT N/A 09/19/2020   Procedure: BILIARY STENT PLACEMENT;  Surgeon: [name], MD;  Location: [facility];  Service: Endoscopy;  Laterality: N/A;   ERCP N/A 09/19/2020   Procedure: ENDOSCOPIC RETROGRADE CHOLANGIOPANCREATOGRAPHY (ERCP);  Surgeon: [name], MD;  Location: [facility];  Service: Endoscopy;  Laterality: N/A;   ESOPHAGOGASTRODUODENOSCOPY (EGD) WITH PROPOFOL N/A 09/19/2020   Procedure: ESOPHAGOGASTRODUODENOSCOPY (EGD) WITH PROPOFOL;  Surgeon: [name], MD;  Location: [facility];  Service: Endoscopy;  Laterality: N/A;   ESOPHAGOGASTRODUODENOSCOPY (EGD) WITH PROPOFOL N/A 09/26/2020   Procedure: ESOPHAGOGASTRODUODENOSCOPY (EGD) WITH PROPOFOL;  Surgeon: [name], MD;  Location: [facility];  Service: Endoscopy;  Laterality: N/A;   EUS N/A 09/19/2020   Procedure: UPPER ENDOSCOPIC ULTRASOUND (  EUS) LINEAR;  Surgeon: [name], MD;  Location: [facility];  Service: Endoscopy;  Laterality: N/A;   FINE NEEDLE ASPIRATION  09/19/2020   Procedure: FINE NEEDLE ASPIRATION;  Surgeon: [name], MD;  Location: [facility];  Service: Endoscopy;;   FINE NEEDLE ASPIRATION N/A 09/26/2020   Procedure: FINE NEEDLE ASPIRATION (FNA) LINEAR;  Surgeon: [name], MD;  Location: [facility];  Service: Endoscopy;  Laterality: N/A;   FINGER SURGERY     IR IMAGING GUIDED PORT INSERTION  11/24/2020   SPHINCTEROTOMY  09/19/2020    Procedure: SPHINCTEROTOMY;  Surgeon: [name], MD;  Location: [facility];  Service: Endoscopy;;   UPPER ESOPHAGEAL ENDOSCOPIC ULTRASOUND (EUS) N/A 09/26/2020   Procedure: UPPER ESOPHAGEAL ENDOSCOPIC ULTRASOUND (EUS);  Surgeon: [name], MD;  Location: [facility];  Service: Endoscopy;  Laterality: N/A;    I have reviewed the social history and family history with the patient and they are unchanged from previous note.  ALLERGIES:  has No Known Allergies.  MEDICATIONS:  Current Outpatient Medications  Medication Sig Dispense Refill   amLODipine (NORVASC) 5 MG tablet Take 5 mg by mouth daily.     blood glucose meter kit and supplies KIT Dispense based on patient and insurance preference. Use up to four times daily as directed. 1 each 0   calcium carbonate (TUMS - DOSED IN MG ELEMENTAL CALCIUM) 500 MG chewable tablet Chew 500 mg by mouth daily as needed for indigestion or heartburn.     furosemide (LASIX) 20 MG tablet Take 20 mg by mouth daily.     insulin glargine (LANTUS) 100 UNIT/ML Solostar Pen Inject 8 Units into the skin daily. 15 mL 0   levothyroxine (SYNTHROID) 75 MCG tablet Take 75 mcg by mouth daily before breakfast.     lidocaine-prilocaine (EMLA) cream Apply 1 application topically as needed. 30 g 2   lisinopril (ZESTRIL) 10 MG tablet Take 10 mg by mouth daily.     metFORMIN (GLUCOPHAGE-XR) 500 MG 24 hr tablet Take 500 mg by mouth 2 (two) times daily.     mirtazapine (REMERON) 7.5 MG tablet TAKE 1 TABLET BY MOUTH EVERYDAY AT BEDTIME 90 tablet 1   ondansetron (ZOFRAN) 8 MG tablet Take 1 tablet (8 mg total) by mouth 2 (two) times daily as needed (Nausea or vomiting). 30 tablet 1   pantoprazole (PROTONIX) 20 MG tablet Take 1 tablet (20 mg total) by mouth daily. 30 tablet 1   prochlorperazine (COMPAZINE) 10 MG tablet Take 1 tablet (10 mg total) by mouth every 6 (six) hours as needed (Nausea or vomiting). 30 tablet 1   traMADol (ULTRAM) 50 MG tablet Take 1 tablet (50 mg  total) by mouth every 12 (twelve) hours as needed. 30 tablet 0   Vitamin D, Ergocalciferol, 50 MCG (2000 UT) CAPS Take 2,000 Units by mouth daily.     No current facility-administered medications for this visit.   Facility-Administered Medications Ordered in Other Visits  Medication Dose Route Frequency Provider Last Rate Last Admin   sodium chloride flush (NS) 0.9 % injection 10 mL  10 mL Intracatheter PRN [name], MD   10 mL at 03/09/21 1735    PHYSICAL EXAMINATION: ECOG PERFORMANCE STATUS: 2 - Symptomatic, <50% confined to bed  Vitals:   03/09/21 1243  BP: (!) 144/72  Pulse: 91  Resp: 18  Temp: 98.2 F (36.8 C)  SpO2: 100%   Wt Readings from Last 3 Encounters:  03/09/21 135 lb 12.8 oz (61.6 kg)  02/23/21 134 lb 4  oz (60.9 kg)  02/16/21 136 lb 1.6 oz (61.7 kg)     GENERAL:alert, no distress and comfortable SKIN: skin color normal, no rashes or significant lesions EYES: normal, Conjunctiva are pink and non-injected, sclera clear  NEURO: alert & oriented x 3 with fluent speech  LABORATORY DATA:  I have reviewed the data as listed CBC Latest Ref Rng & Units 03/09/2021 03/02/2021 02/23/2021  WBC 4.0 - 10.5 K/uL 5.7 2.5(L) 3.0(L)  Hemoglobin 12.0 - 15.0 g/dL 8.7(L) 7.9(L) 8.5(L)  Hematocrit 36.0 - 46.0 % 27.5(L) 24.8(L) 26.8(L)  Platelets 150 - 400 K/uL 376 147(L) 209     CMP Latest Ref Rng & Units 03/09/2021 02/23/2021 02/16/2021  Glucose 70 - 99 mg/dL 208(H) 126(H) 136(H)  BUN 8 - 23 mg/dL 11 8 11   Creatinine 0.44 - 1.00 mg/dL 0.87 0.79 0.73  Sodium 135 - 145 mmol/L 139 138 140  Potassium 3.5 - 5.1 mmol/L 4.5 4.4 4.3  Chloride 98 - 111 mmol/L 107 105 106  CO2 22 - 32 mmol/L 25 26 28   Calcium 8.9 - 10.3 mg/dL 8.4(L) 8.9 8.7(L)  Total Protein 6.5 - 8.1 g/dL 6.5 6.6 6.5  Total Bilirubin 0.3 - 1.2 mg/dL <0.2(L) 0.3 0.2(L)  Alkaline Phos 38 - 126 U/L 113 158(H) 93  AST 15 - 41 U/L 24 39 20  ALT 0 - 44 U/L 18 22 12       RADIOGRAPHIC STUDIES: I have personally  reviewed the radiological images as listed and agreed with the findings in the report. No results found.    No orders of the defined types were placed in this encounter.  All questions were answered. The patient knows to call the clinic with any problems, questions or concerns. No barriers to learning was detected. The total time spent in the appointment was 30 minutes.     [name], MD 03/09/2021   I, Wilburn Mylar, am acting as scribe for [name], MD.   I have reviewed the above documentation for accuracy and completeness, and I agree with the above.

## 2021-03-16 ENCOUNTER — Other Ambulatory Visit: Payer: Medicare Other

## 2021-03-16 ENCOUNTER — Ambulatory Visit: Payer: Medicare Other

## 2021-03-23 ENCOUNTER — Inpatient Hospital Stay (HOSPITAL_BASED_OUTPATIENT_CLINIC_OR_DEPARTMENT_OTHER): Payer: Medicare Other | Admitting: Hematology

## 2021-03-23 ENCOUNTER — Other Ambulatory Visit: Payer: Self-pay

## 2021-03-23 ENCOUNTER — Inpatient Hospital Stay: Payer: Medicare Other | Attending: Hematology

## 2021-03-23 ENCOUNTER — Encounter: Payer: Self-pay | Admitting: Hematology

## 2021-03-23 ENCOUNTER — Inpatient Hospital Stay: Payer: Medicare Other

## 2021-03-23 VITALS — BP 147/69 | HR 93 | Temp 98.6°F | Resp 18 | Ht 66.0 in | Wt 137.5 lb

## 2021-03-23 DIAGNOSIS — C250 Malignant neoplasm of head of pancreas: Secondary | ICD-10-CM | POA: Diagnosis not present

## 2021-03-23 DIAGNOSIS — Z5111 Encounter for antineoplastic chemotherapy: Secondary | ICD-10-CM | POA: Diagnosis not present

## 2021-03-23 DIAGNOSIS — Z95828 Presence of other vascular implants and grafts: Secondary | ICD-10-CM

## 2021-03-23 LAB — CBC WITH DIFFERENTIAL (CANCER CENTER ONLY)
Abs Immature Granulocytes: 0.03 10*3/uL (ref 0.00–0.07)
Basophils Absolute: 0 10*3/uL (ref 0.0–0.1)
Basophils Relative: 1 %
Eosinophils Absolute: 0.1 10*3/uL (ref 0.0–0.5)
Eosinophils Relative: 2 %
HCT: 27.3 % — ABNORMAL LOW (ref 36.0–46.0)
Hemoglobin: 8.8 g/dL — ABNORMAL LOW (ref 12.0–15.0)
Immature Granulocytes: 1 %
Lymphocytes Relative: 18 %
Lymphs Abs: 0.8 10*3/uL (ref 0.7–4.0)
MCH: 28.9 pg (ref 26.0–34.0)
MCHC: 32.2 g/dL (ref 30.0–36.0)
MCV: 89.5 fL (ref 80.0–100.0)
Monocytes Absolute: 0.6 10*3/uL (ref 0.1–1.0)
Monocytes Relative: 15 %
Neutro Abs: 2.7 10*3/uL (ref 1.7–7.7)
Neutrophils Relative %: 63 %
Platelet Count: 237 10*3/uL (ref 150–400)
RBC: 3.05 MIL/uL — ABNORMAL LOW (ref 3.87–5.11)
RDW: 18.1 % — ABNORMAL HIGH (ref 11.5–15.5)
WBC Count: 4.3 10*3/uL (ref 4.0–10.5)
nRBC: 0 % (ref 0.0–0.2)

## 2021-03-23 LAB — CMP (CANCER CENTER ONLY)
ALT: 14 U/L (ref 0–44)
AST: 22 U/L (ref 15–41)
Albumin: 3 g/dL — ABNORMAL LOW (ref 3.5–5.0)
Alkaline Phosphatase: 112 U/L (ref 38–126)
Anion gap: 10 (ref 5–15)
BUN: 10 mg/dL (ref 8–23)
CO2: 22 mmol/L (ref 22–32)
Calcium: 8.3 mg/dL — ABNORMAL LOW (ref 8.9–10.3)
Chloride: 106 mmol/L (ref 98–111)
Creatinine: 0.83 mg/dL (ref 0.44–1.00)
GFR, Estimated: >60 mL/min (ref >60)
Glucose, Bld: 221 mg/dL — ABNORMAL HIGH (ref 70–99)
Potassium: 3.9 mmol/L (ref 3.5–5.1)
Sodium: 138 mmol/L (ref 135–145)
Total Bilirubin: 0.3 mg/dL (ref 0.3–1.2)
Total Protein: 6.4 g/dL — ABNORMAL LOW (ref 6.5–8.1)

## 2021-03-23 MED ORDER — PROCHLORPERAZINE MALEATE 10 MG PO TABS
10.0000 mg | ORAL_TABLET | Freq: Once | ORAL | Status: AC
  Administered 2021-03-23: 10 mg via ORAL
  Filled 2021-03-23: qty 1

## 2021-03-23 MED ORDER — DIPHENHYDRAMINE HCL 25 MG PO CAPS
50.0000 mg | ORAL_CAPSULE | Freq: Once | ORAL | Status: AC
  Administered 2021-03-23: 50 mg via ORAL
  Filled 2021-03-23: qty 2

## 2021-03-23 MED ORDER — SODIUM CHLORIDE 0.9 % IV SOLN
1000.0000 mg/m2 | Freq: Once | INTRAVENOUS | Status: AC
  Administered 2021-03-23: 1710 mg via INTRAVENOUS
  Filled 2021-03-23: qty 44.97

## 2021-03-23 MED ORDER — FAMOTIDINE 20 MG IN NS 100 ML IVPB
20.0000 mg | Freq: Once | INTRAVENOUS | Status: AC
  Administered 2021-03-23: 20 mg via INTRAVENOUS
  Filled 2021-03-23: qty 100

## 2021-03-23 MED ORDER — PACLITAXEL PROTEIN-BOUND CHEMO INJECTION 100 MG
100.0000 mg/m2 | Freq: Once | INTRAVENOUS | Status: AC
  Administered 2021-03-23: 175 mg via INTRAVENOUS
  Filled 2021-03-23: qty 35

## 2021-03-23 MED ORDER — SODIUM CHLORIDE 0.9% FLUSH
10.0000 mL | INTRAVENOUS | Status: DC | PRN
  Administered 2021-03-23: 10 mL

## 2021-03-23 MED ORDER — HEPARIN SOD (PORK) LOCK FLUSH 100 UNIT/ML IV SOLN
500.0000 [IU] | Freq: Once | INTRAVENOUS | Status: AC | PRN
  Administered 2021-03-23: 500 [IU]

## 2021-03-23 MED ORDER — SODIUM CHLORIDE 0.9 % IV SOLN: Freq: Once | INTRAVENOUS | Status: AC

## 2021-03-23 MED ORDER — SODIUM CHLORIDE 0.9% FLUSH
10.0000 mL | Freq: Once | INTRAVENOUS | Status: AC
Start: 2021-03-23 — End: 2021-03-23
  Administered 2021-03-23: 10 mL

## 2021-03-23 NOTE — Progress Notes (Signed)
[place]   [phone] Fax:([phone]) [phone]   Clinic Follow up Note   Patient Care Team: [name], DO as PCP - General (Family Medicine) [name], RN (Inactive) as Oncology Nurse Navigator [name], MD as Consulting Physician (Oncology) [name], MD as Consulting Physician (Gastroenterology)  Date of Service:  03/23/2021  CHIEF COMPLAINT: f/u of pancreatic cancer  CURRENT THERAPY:  First-line Gemcitabine and Abraxane every 2 weeks starting 10/07/20. C1 given with Gemcitabine alone. Changed to 2 weeks on/1 week off 01/19/21  ASSESSMENT & PLAN:  [name] is a 80 y.o. female with   1. 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 Her initial EUS on 09/19/20 showed mass at head of pancreas, but biopsy was negative. She had ERCP stent placement same day. -Her repeat EUS biopsy of the mass on 09/26/20 showed adenocarcinoma from pancreatic primary. Her CT CAP showed mass to be 4.5cm. No evidence of metastatic disease in the chest, abdomen, or pelvis. She does have multiple benign cystic lesions in the liver.   -Per GI tumor board discussion, neoadjuvant chemotherapy was recommended and she was referred to Dr. Carmelina Noun at [facility] for surgical consideration.   -She began gemcitabine 2 weeks on/1 week off on 10/07/2020, Abraxane was added with cycle 2 and changed to every 2 weeks.  She has been tolerating very well with relatively no side effects  -Restaging CT 01/06/2021 at [facility] showed similar pancreatic head mass with unchanged associated vascular involvement and stable liver lesions which remain indeterminate.  No evidence of progression. Dr. Mariah Milling recommended an additional 3 months of chemo -I changed her gem/Abraxane to 2 weeks on/1 week off she started 01/19/21. -due to significant fatigue and leg weakness, treatment was changed to every 2  weeks starting with cycle 10 (03/09/21). And given the holidays, we will give her an extra week break after this cycle (C11). -she is doing better overall, will proceed chemo today  -she is scheduled to have restaging CT and see Dr. Mariah Milling on 1/17    2. Symptom Management: Weight loss, Fatigue, Diarrhea -For the past 3-6 months she has had 25 pounds weight loss and more fatigued.  -She has low appetite, nausea and mild early satiety. She forces herself to remain active at home. -Continue mirtazapine and supplements. I recommend Glucerna given her diabetes. -she also has diarrhea. I recommend imodium first. -Follow-up with dietitian -Stable   3. 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 Genetic Testing -Given her pancreatic cancer she is eligible for genetic testing. She is interested.    5. Social Support -She has great social support from her husband 80 yo) who she lives with and her 3 children who live in town.      PLAN: -proceed chemotherapy gemcitabine and Abraxane today at same dose -Lab, follow-up and next cycle chemo in 3 weeks   No problem-specific Assessment & Plan notes found for this encounter.   SUMMARY OF ONCOLOGIC HISTORY: Oncology History Overview Note  Cancer Staging Pancreatic cancer [facility]) Staging form: Exocrine Pancreas, AJCC 8th Edition - Clinical stage from 09/26/2020: Stage IB (cT2, cN0, cM0) - Signed by [name], MD on 09/29/2020 Stage prefix: Initial diagnosis Total positive nodes: 0    Pancreatic cancer ([place])  09/18/2020 Imaging   US Abdomen  IMPRESSION: Intrahepatic and extrahepatic biliary ductal dilatation. Common bile duct dilated up to 13 mm. This could  be further evaluated with MRCP or ERCP.   Sludge within the gallbladder with associated gallbladder wall thickening and pericholecystic fluid. Cannot exclude acute cholecystitis.   Multiple hepatic cysts. Complex cystic area posteriorly in the right hepatic lobe. This  could also be further evaluated with MRI.   09/19/2020 Procedure   EUS and ERCP by Dr Benson Norway   EUS IMPRESSION - A mass was identified in the pancreatic head. The staging applies if malignancy is confirmed. Fine needle aspiration performed. - There was dilation in the common hepatic duct which measured up to 13 mm. - There was dilation in the intrahepatic bile ducts, diffusely. - A cyst was found in the left lobe of the liver and measured 24 mm by 15 mm. - Endosonographic images of the left adrenal gland were unremarkable.  ERCP IMPRESSION - The major papilla appeared normal. - A biliary sphincterotomy was performed. - Cells for cytology obtained distal CBD. - One covered metal stent was placed into the common bile duct.     09/19/2020 Pathology Results   A. PANCREAS, HEAD, FINE NEEDLE ASPIRATION:  - Benign reactive/reparative changes  - Bland spindle cell fragments   DIAGNOSTIC COMMENTS:  There are fragments of bland spindle cells, favored to represent smooth muscle tissue. The glandular fragments are also bland and thus, there is no definitive malignancy identified   B. BILIARY, STRICTURE, BRUSHING:  - Benign reactive/reparative changes   09/20/2020 Imaging   MRI Abdomen  IMPRESSION: 1. 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 Recommend ERCP/EUS or tissue sampling. 2. There is mild biliary ductal dilatation of the common hepatic bile duct and the intrahepatic bile ducts in the LEFT hepatic lobe. Sequences are severely degraded by patient motion. Concern for obstruction of the common bile duct by the pancreatic head mass. 3. Multiple cystic lesions throughout the liver parenchyma. Assessment of enhancement is difficult due to patient motion as well as no true noncontrast T1 weighted imaging as described above. Favor complex benign cysts but consider liver protocol contrast CT for  further evaluation of the cystic lesions to exclude metastasis.   09/21/2020 Imaging   CT CAP  IMPRESSION: 1. Heterogeneous mass of the central pancreatic head measuring 4.5 x 3.3 cm. There is atrophy of the distal pancreatic parenchyma with diffuse pancreatic ductal dilatation up to 0.8 cm. Mass appears to closely abut the lateral surface of the portal vein near the confluence. The superior mesenteric artery is well separated by a fat plane, as is the hepatic artery. 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 The exact borders and size of the mass are better delineated by prior MR. 2. No evidence of metastatic disease in the chest, abdomen, or pelvis. 3. There are numerous lobulated, fluid attenuating cysts throughout the hepatic parenchyma, numerous additional subcentimeter lesions are too small to characterize although demonstrate no evidence of contrast enhancement and are likely additional subcentimeter cysts. Attention on follow-up. 4. Status post common bile duct stent with post stenting pneumobilia. 5. Thickening and fat stranding about the gallbladder as well as about the adjacent duodenal bulb and descending duodenum, of uncertain etiology, concerning for cholecystitis, enteritis, and/or pancreatitis, possibly related to recent endoscopic procedure. Aortic Atherosclerosis (ICD10-I70.0).   09/26/2020 Cancer Staging   Staging form: Exocrine Pancreas, AJCC 8th Edition - Clinical stage from 09/26/2020: Stage IIA (cT3, cN0, cM0) - Signed by [name], MD on 09/29/2020 Stage prefix: Initial diagnosis Total positive nodes: 0  09/26/2020 Procedure   EUS by Dr Benson Norway  IMPRESSION - A mass was identified in the pancreatic head. Cytology results are pending. However, the endosonographic appearance is consistent with adenocarcinoma. Fine needle aspiration performed.   09/26/2020 Initial Biopsy   A. PANCREAS, HEAD, FINE NEEDLE ASPIRATION:  - Malignant cells consistent with  adenocarcinoma    09/29/2020 Initial Diagnosis   Pancreatic cancer ([place])   10/07/2020 -  Chemotherapy   First-line Gemcitabine and Abraxane 2 weeks on/1 week off starting 10/07/20.  C1 given with Gemcitabine alone.  Abraxane added with cycle 2 and changed to every 2 weeks    Genetic Testing   Negative genetic testing. No pathogenic variants identified on the [facility] CancerNext-Expanded+RNA panel. The report date is 10/16/2020.  The CancerNext-Expanded + RNAinsight gene panel offered by [employer] and includes sequencing and rearrangement analysis for the following 77 genes: IP, ALK, APC*, ATM*, AXIN2, BAP1, BARD1, BLM, BMPR1A, BRCA1*, BRCA2*, BRIP1*, CDC73, CDH1*,CDK4, CDKN1B, CDKN2A, CHEK2*, CTNNA1, DICER1, FANCC, FH, FLCN, GALNT12, KIF1B, LZTR1, MAX, MEN1, MET, MLH1*, MSH2*, MSH3, MSH6*, MUTYH*, NBN, NF1*, NF2, NTHL1, PALB2*, PHOX2B, PMS2*, POT1, PRKAR1A, PTCH1, PTEN*, RAD51C*, RAD51D*,RB1, RECQL, RET, SDHA, SDHAF2, SDHB, SDHC, SDHD, SMAD4, SMARCA4, SMARCB1, SMARCE1, STK11, SUFU, TMEM127, TP53*,TSC1, TSC2, VHL and XRCC2 (sequencing and deletion/duplication); EGFR, EGLN1, HOXB13, KIT, MITF, PDGFRA, POLD1 and POLE (sequencing only); EPCAM and GREM1 (deletion/duplication only).   01/06/2021 Imaging   CT CAP at [facility] Impression:  1.  Similar pancreatic head mass with unchanged associated vascular involvement and pancreatic ductal dilatation.  2.  Stable multifocal hypodense liver lesions, some of which are indeterminate. Consider MR liver with and without IV contrast for further evaluation.  3.  No evidence of new or increasing metastatic disease in the chest, abdomen or pelvis.       INTERVAL HISTORY:  REMI Tina Patel is here for a follow up of pancreatic cancer. She was last seen by me on 03/09/21. She presents to the clinic accompanied by her husband. He reports she seems to be tolerating the every two weeks regimen better. She reports she is having tingling in her feet.   All other systems  were reviewed with the patient and are negative.  MEDICAL HISTORY:  Past Medical History:  Diagnosis Date   Diabetes ([place])    Diabetes mellitus    Family history of colon cancer    Family history of lung cancer    Family history of multiple myeloma    Family history of prostate cancer    High cholesterol    Hypertension    Hypothyroid     SURGICAL HISTORY: Past Surgical History:  Procedure Laterality Date   BILIARY BRUSHING  09/19/2020   Procedure: BILIARY BRUSHING;  Surgeon: [name], MD;  Location: [facility];  Service: Endoscopy;;   BILIARY STENT PLACEMENT N/A 09/19/2020   Procedure: BILIARY STENT PLACEMENT;  Surgeon: [name], MD;  Location: [facility];  Service: Endoscopy;  Laterality: N/A;   ERCP N/A 09/19/2020   Procedure: ENDOSCOPIC RETROGRADE CHOLANGIOPANCREATOGRAPHY (ERCP);  Surgeon: [name], MD;  Location: [facility];  Service: Endoscopy;  Laterality: N/A;   ESOPHAGOGASTRODUODENOSCOPY (EGD) WITH PROPOFOL N/A 09/19/2020   Procedure: ESOPHAGOGASTRODUODENOSCOPY (EGD) WITH PROPOFOL;  Surgeon: [name], MD;  Location: [facility];  Service: Endoscopy;  Laterality: N/A;   ESOPHAGOGASTRODUODENOSCOPY (EGD) WITH PROPOFOL N/A 09/26/2020   Procedure: ESOPHAGOGASTRODUODENOSCOPY (EGD) WITH PROPOFOL;  Surgeon: [name], MD;  Location: [facility];  Service: Endoscopy;  Laterality: N/A;   EUS N/A 09/19/2020   Procedure: UPPER  ENDOSCOPIC ULTRASOUND (EUS) LINEAR;  Surgeon: [name], MD;  Location: [facility];  Service: Endoscopy;  Laterality: N/A;   FINE NEEDLE ASPIRATION  09/19/2020   Procedure: FINE NEEDLE ASPIRATION;  Surgeon: [name], MD;  Location: [facility];  Service: Endoscopy;;   FINE NEEDLE ASPIRATION N/A 09/26/2020   Procedure: FINE NEEDLE ASPIRATION (FNA) LINEAR;  Surgeon: [name], MD;  Location: [facility];  Service: Endoscopy;  Laterality: N/A;   FINGER SURGERY     IR IMAGING GUIDED PORT INSERTION  11/24/2020   SPHINCTEROTOMY   09/19/2020   Procedure: SPHINCTEROTOMY;  Surgeon: [name], MD;  Location: [facility];  Service: Endoscopy;;   UPPER ESOPHAGEAL ENDOSCOPIC ULTRASOUND (EUS) N/A 09/26/2020   Procedure: UPPER ESOPHAGEAL ENDOSCOPIC ULTRASOUND (EUS);  Surgeon: [name], MD;  Location: [facility];  Service: Endoscopy;  Laterality: N/A;    I have reviewed the social history and family history with the patient and they are unchanged from previous note.  ALLERGIES:  has No Known Allergies.  MEDICATIONS:  Current Outpatient Medications  Medication Sig Dispense Refill   amLODipine (NORVASC) 5 MG tablet Take 5 mg by mouth daily.     blood glucose meter kit and supplies KIT Dispense based on patient and insurance preference. Use up to four times daily as directed. 1 each 0   calcium carbonate (TUMS - DOSED IN MG ELEMENTAL CALCIUM) 500 MG chewable tablet Chew 500 mg by mouth daily as needed for indigestion or heartburn.     furosemide (LASIX) 20 MG tablet Take 20 mg by mouth daily.     insulin glargine (LANTUS) 100 UNIT/ML Solostar Pen Inject 8 Units into the skin daily. 15 mL 0   levothyroxine (SYNTHROID) 75 MCG tablet Take 75 mcg by mouth daily before breakfast.     lidocaine-prilocaine (EMLA) cream Apply 1 application topically as needed. 30 g 2   lisinopril (ZESTRIL) 10 MG tablet Take 10 mg by mouth daily.     metFORMIN (GLUCOPHAGE-XR) 500 MG 24 hr tablet Take 500 mg by mouth 2 (two) times daily.     mirtazapine (REMERON) 7.5 MG tablet TAKE 1 TABLET BY MOUTH EVERYDAY AT BEDTIME 90 tablet 1   ondansetron (ZOFRAN) 8 MG tablet Take 1 tablet (8 mg total) by mouth 2 (two) times daily as needed (Nausea or vomiting). 30 tablet 1   pantoprazole (PROTONIX) 20 MG tablet Take 1 tablet (20 mg total) by mouth daily. 30 tablet 1   prochlorperazine (COMPAZINE) 10 MG tablet Take 1 tablet (10 mg total) by mouth every 6 (six) hours as needed (Nausea or vomiting). 30 tablet 1   traMADol (ULTRAM) 50 MG tablet Take 1 tablet  (50 mg total) by mouth every 12 (twelve) hours as needed. 30 tablet 0   Vitamin D, Ergocalciferol, 50 MCG (2000 UT) CAPS Take 2,000 Units by mouth daily.     No current facility-administered medications for this visit.   Facility-Administered Medications Ordered in Other Visits  Medication Dose Route Frequency Provider Last Rate Last Admin   sodium chloride flush (NS) 0.9 % injection 10 mL  10 mL Intracatheter PRN [name], MD   10 mL at 03/23/21 1637    PHYSICAL EXAMINATION: ECOG PERFORMANCE STATUS: 2 - Symptomatic, <50% confined to bed  Vitals:   03/23/21 1320  BP: (!) 147/69  Pulse: 93  Resp: 18  Temp: 98.6 F (37 C)  SpO2: 100%   Wt Readings from Last 3 Encounters:  03/23/21 137 lb 8 oz (62.4 kg)  03/09/21 135  lb 12.8 oz (61.6 kg)  02/23/21 134 lb 4 oz (60.9 kg)     GENERAL:alert, no distress and comfortable SKIN: skin color normal, no rashes or significant lesions EYES: normal, Conjunctiva are pink and non-injected, sclera clear  NEURO: alert & oriented x 3 with fluent speech  LABORATORY DATA:  I have reviewed the data as listed CBC Latest Ref Rng & Units 03/23/2021 03/09/2021 03/02/2021  WBC 4.0 - 10.5 K/uL 4.3 5.7 2.5(L)  Hemoglobin 12.0 - 15.0 g/dL 8.8(L) 8.7(L) 7.9(L)  Hematocrit 36.0 - 46.0 % 27.3(L) 27.5(L) 24.8(L)  Platelets 150 - 400 K/uL 237 376 147(L)     CMP Latest Ref Rng & Units 03/23/2021 03/09/2021 02/23/2021  Glucose 70 - 99 mg/dL 221(H) 208(H) 126(H)  BUN 8 - 23 mg/dL 10 11 8   Creatinine 0.44 - 1.00 mg/dL 0.83 0.87 0.79  Sodium 135 - 145 mmol/L 138 139 138  Potassium 3.5 - 5.1 mmol/L 3.9 4.5 4.4  Chloride 98 - 111 mmol/L 106 107 105  CO2 22 - 32 mmol/L 22 25 26   Calcium 8.9 - 10.3 mg/dL 8.3(L) 8.4(L) 8.9  Total Protein 6.5 - 8.1 g/dL 6.4(L) 6.5 6.6  Total Bilirubin 0.3 - 1.2 mg/dL 0.3 <0.2(L) 0.3  Alkaline Phos 38 - 126 U/L 112 113 158(H)  AST 15 - 41 U/L 22 24 39  ALT 0 - 44 U/L 14 18 22       RADIOGRAPHIC STUDIES: I have personally  reviewed the radiological images as listed and agreed with the findings in the report. No results found.    No orders of the defined types were placed in this encounter.  All questions were answered. The patient knows to call the clinic with any problems, questions or concerns. No barriers to learning was detected. The total time spent in the appointment was 30 minutes.     [name], MD 03/23/2021   I, Wilburn Mylar, am acting as scribe for [name], MD.   I have reviewed the above documentation for accuracy and completeness, and I agree with the above.

## 2021-03-23 NOTE — Patient Instructions (Signed)
[facility] ONCOL[facility]  Discharge Instructions: Thank you for choosing [facility] to provide your oncology and hematology care.   If you have a lab appointment with the [facility], please go directly to the [facility] and check in at the registration area.   Wear comfortable clothing and clothing appropriate for easy access to any Portacath or PICC line.   We strive to give you quality time with your provider. You may need to reschedule your appointment if you arrive late (15 or more minutes).  Arriving late affects you and other patients whose appointments are after yours.  Also, if you miss three or more appointments without notifying the office, you may be dismissed from the clinic at the provider's discretion.      For prescription refill requests, have your pharmacy contact our office and allow 72 hours for refills to be completed.    Today you received the following chemotherapy and/or immunotherapy agents: Abraxane & Gemzar   To help prevent nausea and vomiting after your treatment, we encourage you to take your nausea medication as directed.  BELOW ARE SYMPTOMS THAT SHOULD BE REPORTED IMMEDIATELY: *FEVER GREATER THAN 100.4 F (38 C) OR HIGHER *CHILLS OR SWEATING *NAUSEA AND VOMITING THAT IS NOT CONTROLLED WITH YOUR NAUSEA MEDICATION *UNUSUAL SHORTNESS OF BREATH *UNUSUAL BRUISING OR BLEEDING *URINARY PROBLEMS (pain or burning when urinating, or frequent urination) *BOWEL PROBLEMS (unusual diarrhea, constipation, pain near the anus) TENDERNESS IN MOUTH AND THROAT WITH OR WITHOUT PRESENCE OF ULCERS (sore throat, sores in mouth, or a toothache) UNUSUAL RASH, SWELLING OR PAIN  UNUSUAL VAGINAL DISCHARGE OR ITCHING   Items with * indicate a potential emergency and should be followed up as soon as possible or go to the Emergency Department if any problems should occur.  Please show the CHEMOTHERAPY ALERT CARD or IMMUNOTHERAPY ALERT CARD at  check-in to the Emergency Department and triage nurse.  Should you have questions after your visit or need to cancel or reschedule your appointment, please contact [facility]  Dept: [phone]  and follow the prompts.  Office hours are 8:00 a.m. to 4:30 p.m. Monday - Friday. Please note that voicemails left after 4:00 p.m. may not be returned until the following business day.  We are closed weekends and major holidays. You have access to a nurse at all times for urgent questions. Please call the main number to the clinic Dept: [phone] and follow the prompts.   For any non-urgent questions, you may also contact your provider using MyChart. We now offer e-Visits for anyone 70 and older to request care online for non-urgent symptoms. For details visit [email or web address].   Also download the MyChart app! Go to the app store, search "MyChart", open the app, select [facility], and log in with your MyChart username and password.  Due to Covid, a mask is required upon entering the hospital/clinic. If you do not have a mask, one will be given to you upon arrival. For doctor visits, patients may have 1 support person aged 67 or older with them. For treatment visits, patients cannot have anyone with them due to current Covid guidelines and our immunocompromised population.

## 2021-03-24 LAB — CANCER ANTIGEN 19-9: CA 19-9: 5 U/mL (ref 0–35)

## 2021-03-30 ENCOUNTER — Ambulatory Visit: Payer: Medicare Other | Admitting: Hematology

## 2021-03-30 ENCOUNTER — Ambulatory Visit: Payer: Medicare Other

## 2021-03-30 ENCOUNTER — Other Ambulatory Visit: Payer: Medicare Other

## 2021-04-06 ENCOUNTER — Ambulatory Visit: Payer: Medicare Other

## 2021-04-06 ENCOUNTER — Other Ambulatory Visit: Payer: Medicare Other

## 2021-04-15 ENCOUNTER — Other Ambulatory Visit: Payer: Self-pay

## 2021-04-15 ENCOUNTER — Inpatient Hospital Stay: Payer: Medicare Other

## 2021-04-15 ENCOUNTER — Inpatient Hospital Stay (HOSPITAL_BASED_OUTPATIENT_CLINIC_OR_DEPARTMENT_OTHER): Payer: Medicare Other | Admitting: Hematology

## 2021-04-15 VITALS — BP 134/63 | HR 78 | Temp 98.4°F | Resp 18 | Ht 66.0 in | Wt 138.0 lb

## 2021-04-15 DIAGNOSIS — C250 Malignant neoplasm of head of pancreas: Secondary | ICD-10-CM | POA: Diagnosis not present

## 2021-04-15 DIAGNOSIS — Z5111 Encounter for antineoplastic chemotherapy: Secondary | ICD-10-CM | POA: Diagnosis not present

## 2021-04-15 DIAGNOSIS — Z95828 Presence of other vascular implants and grafts: Secondary | ICD-10-CM

## 2021-04-15 LAB — CBC WITH DIFFERENTIAL (CANCER CENTER ONLY)
Abs Immature Granulocytes: 0.04 10*3/uL (ref 0.00–0.07)
Basophils Absolute: 0 10*3/uL (ref 0.0–0.1)
Basophils Relative: 1 %
Eosinophils Absolute: 0.2 10*3/uL (ref 0.0–0.5)
Eosinophils Relative: 3 %
HCT: 30.3 % — ABNORMAL LOW (ref 36.0–46.0)
Hemoglobin: 9.4 g/dL — ABNORMAL LOW (ref 12.0–15.0)
Immature Granulocytes: 1 %
Lymphocytes Relative: 31 %
Lymphs Abs: 1.5 10*3/uL (ref 0.7–4.0)
MCH: 27.6 pg (ref 26.0–34.0)
MCHC: 31 g/dL (ref 30.0–36.0)
MCV: 88.9 fL (ref 80.0–100.0)
Monocytes Absolute: 0.6 10*3/uL (ref 0.1–1.0)
Monocytes Relative: 12 %
Neutro Abs: 2.5 10*3/uL (ref 1.7–7.7)
Neutrophils Relative %: 52 %
Platelet Count: 277 10*3/uL (ref 150–400)
RBC: 3.41 MIL/uL — ABNORMAL LOW (ref 3.87–5.11)
RDW: 16.6 % — ABNORMAL HIGH (ref 11.5–15.5)
WBC Count: 4.8 10*3/uL (ref 4.0–10.5)
nRBC: 0 % (ref 0.0–0.2)

## 2021-04-15 LAB — CMP (CANCER CENTER ONLY)
ALT: 9 U/L (ref 0–44)
AST: 18 U/L (ref 15–41)
Albumin: 3.5 g/dL (ref 3.5–5.0)
Alkaline Phosphatase: 77 U/L (ref 38–126)
Anion gap: 6 (ref 5–15)
BUN: 13 mg/dL (ref 8–23)
CO2: 27 mmol/L (ref 22–32)
Calcium: 8.6 mg/dL — ABNORMAL LOW (ref 8.9–10.3)
Chloride: 107 mmol/L (ref 98–111)
Creatinine: 0.76 mg/dL (ref 0.44–1.00)
GFR, Estimated: >60 mL/min (ref >60)
Glucose, Bld: 177 mg/dL — ABNORMAL HIGH (ref 70–99)
Potassium: 3.8 mmol/L (ref 3.5–5.1)
Sodium: 140 mmol/L (ref 135–145)
Total Bilirubin: 0.3 mg/dL (ref 0.3–1.2)
Total Protein: 6.5 g/dL (ref 6.5–8.1)

## 2021-04-15 MED ORDER — SODIUM CHLORIDE 0.9 % IV SOLN: Freq: Once | INTRAVENOUS | Status: AC

## 2021-04-15 MED ORDER — DIPHENHYDRAMINE HCL 25 MG PO CAPS
50.0000 mg | ORAL_CAPSULE | Freq: Once | ORAL | Status: AC
  Administered 2021-04-15: 15:00:00 50 mg via ORAL
  Filled 2021-04-15: qty 2

## 2021-04-15 MED ORDER — SODIUM CHLORIDE 0.9 % IV SOLN
1000.0000 mg/m2 | Freq: Once | INTRAVENOUS | Status: AC
  Administered 2021-04-15: 16:00:00 1710 mg via INTRAVENOUS
  Filled 2021-04-15: qty 44.97

## 2021-04-15 MED ORDER — PROCHLORPERAZINE MALEATE 10 MG PO TABS
10.0000 mg | ORAL_TABLET | Freq: Once | ORAL | Status: AC
  Administered 2021-04-15: 15:00:00 10 mg via ORAL
  Filled 2021-04-15: qty 1

## 2021-04-15 MED ORDER — SODIUM CHLORIDE 0.9% FLUSH
10.0000 mL | INTRAVENOUS | Status: DC | PRN
  Administered 2021-04-15: 17:00:00 10 mL

## 2021-04-15 MED ORDER — HEPARIN SOD (PORK) LOCK FLUSH 100 UNIT/ML IV SOLN
500.0000 [IU] | Freq: Once | INTRAVENOUS | Status: AC | PRN
  Administered 2021-04-15: 17:00:00 500 [IU]

## 2021-04-15 MED ORDER — FAMOTIDINE 20 MG IN NS 100 ML IVPB
20.0000 mg | Freq: Once | INTRAVENOUS | Status: AC
  Administered 2021-04-15: 15:00:00 20 mg via INTRAVENOUS
  Filled 2021-04-15: qty 100

## 2021-04-15 MED ORDER — SODIUM CHLORIDE 0.9% FLUSH
10.0000 mL | Freq: Once | INTRAVENOUS | Status: AC
  Administered 2021-04-15: 13:00:00 10 mL

## 2021-04-15 NOTE — Progress Notes (Signed)
[place]   Telephone:(336) [phone] Fax:(336) [phone]   Clinic Follow up Note   Patient Care Team: [name], DO as PCP - General (Family Medicine) [name], RN (Inactive) as Oncology Nurse Navigator [name], MD as Consulting Physician (Oncology) [name], MD as Consulting Physician (Gastroenterology)  Date of Service:  04/15/2021  CHIEF COMPLAINT: f/u of pancreatic cancer  CURRENT THERAPY:  First-line Gemcitabine and Abraxane every 2 weeks starting 10/07/20. C1 given with Gemcitabine alone. Changed to 2 weeks on/1 week off 01/19/21. Changed to every 2 weeks with C10 (03/09/21)  ASSESSMENT & PLAN:  Tina Patel is a 80 y.o. female with   1. 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 Her initial EUS on 09/19/20 showed mass at head of pancreas, but biopsy was negative. She had ERCP stent placement same day. -repeat EUS biopsy on 09/26/20 showed adenocarcinoma from pancreatic primary. Her CT CAP showed mass to be 4.5cm, no evidence of metastatic disease, benign cystic liver lesions.   -She began neoadjuvant gemcitabine 2 weeks on/1 week off on 10/07/20, Abraxane was added with cycle 2 and changed to every 2 weeks.  She has been tolerating very well with relatively no side effects  -Restaging CT 01/06/21 at [facility] showed similar pancreatic head mass with unchanged associated vascular involvement and stable liver lesions which remain indeterminate.  No evidence of progression. Dr. Mariah Milling recommended an additional 3 months of chemo -I changed her gem/Abraxane to 2 weeks on/1 week off starting 01/19/21. However due to significant fatigue and leg weakness, treatment was changed to every 2 weeks starting with cycle 10 (03/09/21).  -she tells me today she does not want surgery; she would prefer to continue chemo. -lab reviewed, adequate for treatment, will  proceed chemotherapy today. -If she is not going to [facility], I plan to repeat a CT scan in January.   2. Symptom Management: Weight loss, Fatigue, Diarrhea -she reported 25 pounds weight loss 3-6 months prior to diagnosis.  -She has low appetite, nausea and mild early satiety. She forces herself to remain active at home. -Continue mirtazapine and nutritional supplements.  -Follow-up with dietitian -Stable   3. 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 Genetic Testing -Given her pancreatic cancer she is eligible for genetic testing. She is interested.    5. Social Support -She has great social support from her husband 80 yo) who she lives with and her 3 children who live in town.      PLAN: -proceed gemcitabine alone today, hold Abraxane given developing neuropathy -Lab, follow-up and next cycle chemo in 2 weeks   No problem-specific Assessment & Plan notes found for this encounter.   SUMMARY OF ONCOLOGIC HISTORY: Oncology History Overview Note  Cancer Staging Pancreatic cancer [facility]) Staging form: Exocrine Pancreas, AJCC 8th Edition - Clinical stage from 09/26/2020: Stage IB (cT2, cN0, cM0) - Signed by [name], MD on 09/29/2020 Stage prefix: Initial diagnosis Total positive nodes: 0    Pancreatic cancer ([place])  09/18/2020 Imaging   US Abdomen  IMPRESSION: Intrahepatic and extrahepatic biliary ductal dilatation. Common bile duct dilated up to 13 mm. This could be further evaluated with MRCP or ERCP.   Sludge within the gallbladder with associated gallbladder wall thickening and pericholecystic fluid. Cannot exclude acute cholecystitis.   Multiple hepatic cysts. Complex cystic area posteriorly in the right hepatic lobe. This could also be further  evaluated with MRI.   09/19/2020 Procedure   EUS and ERCP by Dr Benson Norway   EUS IMPRESSION - A mass was identified in the pancreatic head. The staging applies if malignancy is confirmed. Fine needle aspiration  performed. - There was dilation in the common hepatic duct which measured up to 13 mm. - There was dilation in the intrahepatic bile ducts, diffusely. - A cyst was found in the left lobe of the liver and measured 24 mm by 15 mm. - Endosonographic images of the left adrenal gland were unremarkable.  ERCP IMPRESSION - The major papilla appeared normal. - A biliary sphincterotomy was performed. - Cells for cytology obtained distal CBD. - One covered metal stent was placed into the common bile duct.     09/19/2020 Pathology Results   A. PANCREAS, HEAD, FINE NEEDLE ASPIRATION:  - Benign reactive/reparative changes  - Bland spindle cell fragments   DIAGNOSTIC COMMENTS:  There are fragments of bland spindle cells, favored to represent smooth muscle tissue. The glandular fragments are also bland and thus, there is no definitive malignancy identified   B. BILIARY, STRICTURE, BRUSHING:  - Benign reactive/reparative changes   09/20/2020 Imaging   MRI Abdomen  IMPRESSION: 1. 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 Recommend ERCP/EUS or tissue sampling. 2. There is mild biliary ductal dilatation of the common hepatic bile duct and the intrahepatic bile ducts in the LEFT hepatic lobe. Sequences are severely degraded by patient motion. Concern for obstruction of the common bile duct by the pancreatic head mass. 3. Multiple cystic lesions throughout the liver parenchyma. Assessment of enhancement is difficult due to patient motion as well as no true noncontrast T1 weighted imaging as described above. Favor complex benign cysts but consider liver protocol contrast CT for further evaluation of the cystic lesions to exclude metastasis.   09/21/2020 Imaging   CT CAP  IMPRESSION: 1. Heterogeneous mass of the central pancreatic head measuring 4.5 x 3.3 cm. There is atrophy of the distal pancreatic  parenchyma with diffuse pancreatic ductal dilatation up to 0.8 cm. Mass appears to closely abut the lateral surface of the portal vein near the confluence. The superior mesenteric artery is well separated by a fat plane, as is the hepatic artery. 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 The exact borders and size of the mass are better delineated by prior MR. 2. No evidence of metastatic disease in the chest, abdomen, or pelvis. 3. There are numerous lobulated, fluid attenuating cysts throughout the hepatic parenchyma, numerous additional subcentimeter lesions are too small to characterize although demonstrate no evidence of contrast enhancement and are likely additional subcentimeter cysts. Attention on follow-up. 4. Status post common bile duct stent with post stenting pneumobilia. 5. Thickening and fat stranding about the gallbladder as well as about the adjacent duodenal bulb and descending duodenum, of uncertain etiology, concerning for cholecystitis, enteritis, and/or pancreatitis, possibly related to recent endoscopic procedure. Aortic Atherosclerosis (ICD10-I70.0).   09/26/2020 Cancer Staging   Staging form: Exocrine Pancreas, AJCC 8th Edition - Clinical stage from 09/26/2020: Stage IIA (cT3, cN0, cM0) - Signed by [name], MD on 09/29/2020 Stage prefix: Initial diagnosis Total positive nodes: 0    09/26/2020 Procedure   EUS by Dr Benson Norway  IMPRESSION - A mass was identified in the pancreatic head. Cytology results are pending. However, the endosonographic appearance is consistent with adenocarcinoma. Fine needle aspiration performed.   09/26/2020 Initial Biopsy   A.  PANCREAS, HEAD, FINE NEEDLE ASPIRATION:  - Malignant cells consistent with adenocarcinoma    09/29/2020 Initial Diagnosis   Pancreatic cancer ([place])   10/07/2020 -  Chemotherapy   First-line Gemcitabine and Abraxane 2 weeks on/1 week off starting 10/07/20.  C1 given with Gemcitabine alone.  Abraxane added  with cycle 2 and changed to every 2 weeks    Genetic Testing   Negative genetic testing. No pathogenic variants identified on the [facility] CancerNext-Expanded+RNA panel. The report date is 10/16/2020.  The CancerNext-Expanded + RNAinsight gene panel offered by [employer] and includes sequencing and rearrangement analysis for the following 77 genes: IP, ALK, APC*, ATM*, AXIN2, BAP1, BARD1, BLM, BMPR1A, BRCA1*, BRCA2*, BRIP1*, CDC73, CDH1*,CDK4, CDKN1B, CDKN2A, CHEK2*, CTNNA1, DICER1, FANCC, FH, FLCN, GALNT12, KIF1B, LZTR1, MAX, MEN1, MET, MLH1*, MSH2*, MSH3, MSH6*, MUTYH*, NBN, NF1*, NF2, NTHL1, PALB2*, PHOX2B, PMS2*, POT1, PRKAR1A, PTCH1, PTEN*, RAD51C*, RAD51D*,RB1, RECQL, RET, SDHA, SDHAF2, SDHB, SDHC, SDHD, SMAD4, SMARCA4, SMARCB1, SMARCE1, STK11, SUFU, TMEM127, TP53*,TSC1, TSC2, VHL and XRCC2 (sequencing and deletion/duplication); EGFR, EGLN1, HOXB13, KIT, MITF, PDGFRA, POLD1 and POLE (sequencing only); EPCAM and GREM1 (deletion/duplication only).   01/06/2021 Imaging   CT CAP at [facility] Impression:  1.  Similar pancreatic head mass with unchanged associated vascular involvement and pancreatic ductal dilatation.  2.  Stable multifocal hypodense liver lesions, some of which are indeterminate. Consider MR liver with and without IV contrast for further evaluation.  3.  No evidence of new or increasing metastatic disease in the chest, abdomen or pelvis.       INTERVAL HISTORY:  Tina Patel is here for a follow up of pancreatic cancer. She was last seen by me on 03/23/21. She was seen in the infusion area. She reports she is having numbness in her feet.   All other systems were reviewed with the patient and are negative.  MEDICAL HISTORY:  Past Medical History:  Diagnosis Date   Diabetes ([place])    Diabetes mellitus    Family history of colon cancer    Family history of lung cancer    Family history of multiple myeloma    Family history of prostate cancer    High cholesterol     Hypertension    Hypothyroid     SURGICAL HISTORY: Past Surgical History:  Procedure Laterality Date   BILIARY BRUSHING  09/19/2020   Procedure: BILIARY BRUSHING;  Surgeon: [name], MD;  Location: [facility] ENDOSCOPY;  Service: Endoscopy;;   BILIARY STENT PLACEMENT N/A 09/19/2020   Procedure: BILIARY STENT PLACEMENT;  Surgeon: [name], MD;  Location: [facility] ENDOSCOPY;  Service: Endoscopy;  Laterality: N/A;   ERCP N/A 09/19/2020   Procedure: ENDOSCOPIC RETROGRADE CHOLANGIOPANCREATOGRAPHY (ERCP);  Surgeon: [name], MD;  Location: [facility];  Service: Endoscopy;  Laterality: N/A;   ESOPHAGOGASTRODUODENOSCOPY (EGD) WITH PROPOFOL N/A 09/19/2020   Procedure: ESOPHAGOGASTRODUODENOSCOPY (EGD) WITH PROPOFOL;  Surgeon: [name], MD;  Location: [facility];  Service: Endoscopy;  Laterality: N/A;   ESOPHAGOGASTRODUODENOSCOPY (EGD) WITH PROPOFOL N/A 09/26/2020   Procedure: ESOPHAGOGASTRODUODENOSCOPY (EGD) WITH PROPOFOL;  Surgeon: [name], MD;  Location: [facility];  Service: Endoscopy;  Laterality: N/A;   EUS N/A 09/19/2020   Procedure: UPPER ENDOSCOPIC ULTRASOUND (EUS) LINEAR;  Surgeon: [name], MD;  Location: [facility];  Service: Endoscopy;  Laterality: N/A;   FINE NEEDLE ASPIRATION  09/19/2020   Procedure: FINE NEEDLE ASPIRATION;  Surgeon: [name], MD;  Location: [facility];  Service: Endoscopy;;   FINE NEEDLE ASPIRATION N/A 09/26/2020   Procedure: FINE NEEDLE ASPIRATION (FNA)  LINEAR;  Surgeon: [name], MD;  Location: [facility];  Service: Endoscopy;  Laterality: N/A;   FINGER SURGERY     IR IMAGING GUIDED PORT INSERTION  11/24/2020   SPHINCTEROTOMY  09/19/2020   Procedure: SPHINCTEROTOMY;  Surgeon: [name], MD;  Location: [facility];  Service: Endoscopy;;   UPPER ESOPHAGEAL ENDOSCOPIC ULTRASOUND (EUS) N/A 09/26/2020   Procedure: UPPER ESOPHAGEAL ENDOSCOPIC ULTRASOUND (EUS);  Surgeon: [name], MD;  Location: [facility];  Service: Endoscopy;  Laterality:  N/A;    I have reviewed the social history and family history with the patient and they are unchanged from previous note.  ALLERGIES:  has No Known Allergies.  MEDICATIONS:  Current Outpatient Medications  Medication Sig Dispense Refill   amLODipine (NORVASC) 5 MG tablet Take 5 mg by mouth daily.     blood glucose meter kit and supplies KIT Dispense based on patient and insurance preference. Use up to four times daily as directed. 1 each 0   calcium carbonate (TUMS - DOSED IN MG ELEMENTAL CALCIUM) 500 MG chewable tablet Chew 500 mg by mouth daily as needed for indigestion or heartburn.     furosemide (LASIX) 20 MG tablet Take 20 mg by mouth daily.     insulin glargine (LANTUS) 100 UNIT/ML Solostar Pen Inject 8 Units into the skin daily. 15 mL 0   levothyroxine (SYNTHROID) 75 MCG tablet Take 75 mcg by mouth daily before breakfast.     lidocaine-prilocaine (EMLA) cream Apply 1 application topically as needed. 30 g 2   lisinopril (ZESTRIL) 10 MG tablet Take 10 mg by mouth daily.     metFORMIN (GLUCOPHAGE-XR) 500 MG 24 hr tablet Take 500 mg by mouth 2 (two) times daily.     mirtazapine (REMERON) 7.5 MG tablet TAKE 1 TABLET BY MOUTH EVERYDAY AT BEDTIME 90 tablet 1   ondansetron (ZOFRAN) 8 MG tablet Take 1 tablet (8 mg total) by mouth 2 (two) times daily as needed (Nausea or vomiting). 30 tablet 1   pantoprazole (PROTONIX) 20 MG tablet Take 1 tablet (20 mg total) by mouth daily. 30 tablet 1   prochlorperazine (COMPAZINE) 10 MG tablet Take 1 tablet (10 mg total) by mouth every 6 (six) hours as needed (Nausea or vomiting). 30 tablet 1   traMADol (ULTRAM) 50 MG tablet Take 1 tablet (50 mg total) by mouth every 12 (twelve) hours as needed. 30 tablet 0   Vitamin D, Ergocalciferol, 50 MCG (2000 UT) CAPS Take 2,000 Units by mouth daily.     No current facility-administered medications for this visit.   Facility-Administered Medications Ordered in Other Visits  Medication Dose Route Frequency Provider  Last Rate Last Admin   gemcitabine (GEMZAR) 1,710 mg in sodium chloride 0.9 % 250 mL chemo infusion  1,000 mg/m2 (Treatment Plan Recorded) Intravenous Once [name], MD       heparin lock flush 100 unit/mL  500 Units Intracatheter Once PRN [name], MD       sodium chloride flush (NS) 0.9 % injection 10 mL  10 mL Intracatheter PRN [name], MD        PHYSICAL EXAMINATION: ECOG PERFORMANCE STATUS: 2 - Symptomatic, <50% confined to bed  There were no vitals filed for this visit. Wt Readings from Last 3 Encounters:  04/15/21 138 lb (62.6 kg)  03/23/21 137 lb 8 oz (62.4 kg)  03/09/21 135 lb 12.8 oz (61.6 kg)     GENERAL:alert, no distress and comfortable SKIN: skin color normal, no rashes or significant lesions  EYES: normal, Conjunctiva are pink and non-injected, sclera clear  NEURO: alert & oriented x 3 with fluent speech  LABORATORY DATA:  I have reviewed the data as listed CBC Latest Ref Rng & Units 04/15/2021 03/23/2021 03/09/2021  WBC 4.0 - 10.5 K/uL 4.8 4.3 5.7  Hemoglobin 12.0 - 15.0 g/dL 9.4(L) 8.8(L) 8.7(L)  Hematocrit 36.0 - 46.0 % 30.3(L) 27.3(L) 27.5(L)  Platelets 150 - 400 K/uL 277 237 376     CMP Latest Ref Rng & Units 04/15/2021 03/23/2021 03/09/2021  Glucose 70 - 99 mg/dL 177(H) 221(H) 208(H)  BUN 8 - 23 mg/dL _0 Creatinine 0.44 - 1.00 mg/dL 0.76 0.83 0.87  Sodium 135 - 145 mmol/L 140 138 139  Potassium 3.5 - 5.1 mmol/L 3.8 3.9 4.5  Chloride 98 - 111 mmol/L 107 106 107  CO2 22 - 32 mmol/L _1 Calcium 8.9 - 10.3 mg/dL 8.6(L) 8.3(L) 8.4(L)  Total Protein 6.5 - 8.1 g/dL 6.5 6.4(L) 6.5  Total Bilirubin 0.3 - 1.2 mg/dL 0.3 0.3 <0.2(L)  Alkaline Phos 38 - 126 U/L 77 112 113  AST 15 - 41 U/L _2 ALT 0 - 44 U/L _3 RADIOGRAPHIC STUDIES: I have personally reviewed the radiological images as listed and agreed with the findings in the report. No results found.    No orders of the defined types were placed in this encounter.  All  questions were answered. The patient knows to call the clinic with any problems, questions or concerns. No barriers to learning was detected. The total time spent in the appointment was 30 minutes.     [name], MD 04/15/2021   I, Wilburn Mylar, am acting as scribe for [name], MD.   I have reviewed the above documentation for accuracy and completeness, and I agree with the above.

## 2021-04-15 NOTE — Patient Instructions (Signed)
[facility] MEDICAL [facility]COLOGY   Discharge Instructions: Thank you for choosing [facility] to provide your oncology and hematology care.   If you have a lab appointment with the Cancer Center, please go directly to the Cancer Center and check in at the registration area.   Wear comfortable clothing and clothing appropriate for easy access to any Portacath or PICC line.   We strive to give you quality time with your provider. You may need to reschedule your appointment if you arrive late (15 or more minutes).  Arriving late affects you and other patients whose appointments are after yours.  Also, if you miss three or more appointments without notifying the office, you may be dismissed from the clinic at the provider's discretion.      For prescription refill requests, have your pharmacy contact our office and allow 72 hours for refills to be completed.    Today you received the following chemotherapy and/or immunotherapy agents: Gemcitabine (Gemzar)      To help prevent nausea and vomiting after your treatment, we encourage you to take your nausea medication as directed.  BELOW ARE SYMPTOMS THAT SHOULD BE REPORTED IMMEDIATELY: *FEVER GREATER THAN 100.4 F (38 C) OR HIGHER *CHILLS OR SWEATING *NAUSEA AND VOMITING THAT IS NOT CONTROLLED WITH YOUR NAUSEA MEDICATION *UNUSUAL SHORTNESS OF BREATH *UNUSUAL BRUISING OR BLEEDING *URINARY PROBLEMS (pain or burning when urinating, or frequent urination) *BOWEL PROBLEMS (unusual diarrhea, constipation, pain near the anus) TENDERNESS IN MOUTH AND THROAT WITH OR WITHOUT PRESENCE OF ULCERS (sore throat, sores in mouth, or a toothache) UNUSUAL RASH, SWELLING OR PAIN  UNUSUAL VAGINAL DISCHARGE OR ITCHING   Items with * indicate a potential emergency and should be followed up as soon as possible or go to the Emergency Department if any problems should occur.  Please show the CHEMOTHERAPY ALERT CARD or IMMUNOTHERAPY ALERT CARD at  check-in to the Emergency Department and triage nurse.  Should you have questions after your visit or need to cancel or reschedule your appointment, please contact [facility]COL[facility]  Dept: [phone]  and follow the prompts.  Office hours are 8:00 a.m. to 4:30 p.m. Monday - Friday. Please note that voicemails left after 4:00 p.m. may not be returned until the following business day.  We are closed weekends and major holidays. You have access to a nurse at all times for urgent questions. Please call the main number to the clinic Dept: [phone] and follow the prompts.   For any non-urgent questions, you may also contact your provider using MyChart. We now offer e-Visits for anyone 18 and older to request care online for non-urgent symptoms. For details visit [email or web address].   Also download the MyChart app! Go to the app store, search "MyChart", open the app, select [place], and log in with your MyChart username and password.  Due to Covid, a mask is required upon entering the hospital/clinic. If you do not have a mask, one will be given to you upon arrival. For doctor visits, patients may have 1 support person aged 18 or older with them. For treatment visits, patients cannot have anyone with them due to current Covid guidelines and our immunocompromised population.   

## 2021-04-15 NOTE — Progress Notes (Signed)
Per Dr. Burr Medico - patient will not receive abraxane today d/t increased peripheral neuropathy. Will proceed with gemzar only today.

## 2021-04-16 LAB — CANCER ANTIGEN 19-9: CA 19-9: 7 U/mL (ref 0–35)

## 2021-04-18 ENCOUNTER — Encounter: Payer: Self-pay | Admitting: Hematology

## 2021-04-21 ENCOUNTER — Telehealth: Payer: Self-pay | Admitting: Hematology

## 2021-04-21 NOTE — Telephone Encounter (Signed)
Scheduled follow-up appointment per 12/28 los. Patient's spouse is aware.

## 2021-04-28 ENCOUNTER — Encounter: Payer: Self-pay | Admitting: Nurse Practitioner

## 2021-04-29 ENCOUNTER — Inpatient Hospital Stay: Payer: Medicare Other

## 2021-04-29 ENCOUNTER — Inpatient Hospital Stay: Payer: Medicare Other | Admitting: Hematology

## 2021-04-29 ENCOUNTER — Other Ambulatory Visit: Payer: Self-pay

## 2021-04-29 ENCOUNTER — Encounter: Payer: Self-pay | Admitting: Hematology

## 2021-04-29 ENCOUNTER — Inpatient Hospital Stay: Payer: Medicare Other | Attending: Hematology

## 2021-04-29 VITALS — BP 145/79 | HR 80 | Temp 98.1°F | Resp 17 | Ht 66.0 in | Wt 138.3 lb

## 2021-04-29 DIAGNOSIS — Z95828 Presence of other vascular implants and grafts: Secondary | ICD-10-CM

## 2021-04-29 DIAGNOSIS — C250 Malignant neoplasm of head of pancreas: Secondary | ICD-10-CM

## 2021-04-29 DIAGNOSIS — Z5111 Encounter for antineoplastic chemotherapy: Secondary | ICD-10-CM | POA: Diagnosis present

## 2021-04-29 LAB — CBC WITH DIFFERENTIAL (CANCER CENTER ONLY)
Abs Immature Granulocytes: 0.02 10*3/uL (ref 0.00–0.07)
Basophils Absolute: 0 10*3/uL (ref 0.0–0.1)
Basophils Relative: 0 %
Eosinophils Absolute: 0.1 10*3/uL (ref 0.0–0.5)
Eosinophils Relative: 3 %
HCT: 30.2 % — ABNORMAL LOW (ref 36.0–46.0)
Hemoglobin: 10 g/dL — ABNORMAL LOW (ref 12.0–15.0)
Immature Granulocytes: 1 %
Lymphocytes Relative: 32 %
Lymphs Abs: 1.3 10*3/uL (ref 0.7–4.0)
MCH: 28.8 pg (ref 26.0–34.0)
MCHC: 33.1 g/dL (ref 30.0–36.0)
MCV: 87 fL (ref 80.0–100.0)
Monocytes Absolute: 0.4 10*3/uL (ref 0.1–1.0)
Monocytes Relative: 10 %
Neutro Abs: 2.2 10*3/uL (ref 1.7–7.7)
Neutrophils Relative %: 54 %
Platelet Count: 232 10*3/uL (ref 150–400)
RBC: 3.47 MIL/uL — ABNORMAL LOW (ref 3.87–5.11)
RDW: 15.8 % — ABNORMAL HIGH (ref 11.5–15.5)
WBC Count: 4 10*3/uL (ref 4.0–10.5)
nRBC: 0 % (ref 0.0–0.2)

## 2021-04-29 LAB — CMP (CANCER CENTER ONLY)
ALT: 10 U/L (ref 0–44)
AST: 19 U/L (ref 15–41)
Albumin: 3.6 g/dL (ref 3.5–5.0)
Alkaline Phosphatase: 74 U/L (ref 38–126)
Anion gap: 6 (ref 5–15)
BUN: 13 mg/dL (ref 8–23)
CO2: 27 mmol/L (ref 22–32)
Calcium: 9.1 mg/dL (ref 8.9–10.3)
Chloride: 106 mmol/L (ref 98–111)
Creatinine: 0.8 mg/dL (ref 0.44–1.00)
GFR, Estimated: >60 mL/min (ref >60)
Glucose, Bld: 130 mg/dL — ABNORMAL HIGH (ref 70–99)
Potassium: 4.2 mmol/L (ref 3.5–5.1)
Sodium: 139 mmol/L (ref 135–145)
Total Bilirubin: 0.3 mg/dL (ref 0.3–1.2)
Total Protein: 6.9 g/dL (ref 6.5–8.1)

## 2021-04-29 MED ORDER — FAMOTIDINE 20 MG IN NS 100 ML IVPB
20.0000 mg | Freq: Once | INTRAVENOUS | Status: AC
  Administered 2021-04-29: 20 mg via INTRAVENOUS
  Filled 2021-04-29: qty 100

## 2021-04-29 MED ORDER — SODIUM CHLORIDE 0.9% FLUSH
10.0000 mL | Freq: Once | INTRAVENOUS | Status: AC
  Administered 2021-04-29: 10 mL

## 2021-04-29 MED ORDER — SODIUM CHLORIDE 0.9% FLUSH
10.0000 mL | INTRAVENOUS | Status: DC | PRN
  Administered 2021-04-29: 10 mL

## 2021-04-29 MED ORDER — SODIUM CHLORIDE 0.9 % IV SOLN
1000.0000 mg/m2 | Freq: Once | INTRAVENOUS | Status: AC
  Administered 2021-04-29: 1710 mg via INTRAVENOUS
  Filled 2021-04-29: qty 44.97

## 2021-04-29 MED ORDER — PROCHLORPERAZINE MALEATE 10 MG PO TABS
10.0000 mg | ORAL_TABLET | Freq: Once | ORAL | Status: AC
  Administered 2021-04-29: 10 mg via ORAL
  Filled 2021-04-29: qty 1

## 2021-04-29 MED ORDER — HEPARIN SOD (PORK) LOCK FLUSH 100 UNIT/ML IV SOLN
500.0000 [IU] | Freq: Once | INTRAVENOUS | Status: AC | PRN
  Administered 2021-04-29: 500 [IU]

## 2021-04-29 MED ORDER — DIPHENHYDRAMINE HCL 25 MG PO CAPS
25.0000 mg | ORAL_CAPSULE | Freq: Once | ORAL | Status: AC
  Administered 2021-04-29: 25 mg via ORAL
  Filled 2021-04-29: qty 1

## 2021-04-29 MED ORDER — SODIUM CHLORIDE 0.9 % IV SOLN: Freq: Once | INTRAVENOUS | Status: AC

## 2021-04-29 NOTE — Patient Instructions (Signed)
[facility]COLOGY   ?Discharge Instructions: ?Thank you for choosing [facility] to provide your oncology and hematology care.  ? ?If you have a lab appointment with the Cancer Center, please go directly to the Cancer Center and check in at the registration area. ?  ?Wear comfortable clothing and clothing appropriate for easy access to any Portacath or PICC line.  ? ?We strive to give you quality time with your provider. You may need to reschedule your appointment if you arrive late (15 or more minutes).  Arriving late affects you and other patients whose appointments are after yours.  Also, if you miss three or more appointments without notifying the office, you may be dismissed from the clinic at the provider?s discretion.    ?  ?For prescription refill requests, have your pharmacy contact our office and allow 72 hours for refills to be completed.   ? ?Today you received the following chemotherapy and/or immunotherapy agents: gemcitabine    ?  ?To help prevent nausea and vomiting after your treatment, we encourage you to take your nausea medication as directed. ? ?BELOW ARE SYMPTOMS THAT SHOULD BE REPORTED IMMEDIATELY: ?*FEVER GREATER THAN 100.4 F (38 ?C) OR HIGHER ?*CHILLS OR SWEATING ?*NAUSEA AND VOMITING THAT IS NOT CONTROLLED WITH YOUR NAUSEA MEDICATION ?*UNUSUAL SHORTNESS OF BREATH ?*UNUSUAL BRUISING OR BLEEDING ?*URINARY PROBLEMS (pain or burning when urinating, or frequent urination) ?*BOWEL PROBLEMS (unusual diarrhea, constipation, pain near the anus) ?TENDERNESS IN MOUTH AND THROAT WITH OR WITHOUT PRESENCE OF ULCERS (sore throat, sores in mouth, or a toothache) ?UNUSUAL RASH, SWELLING OR PAIN  ?UNUSUAL VAGINAL DISCHARGE OR ITCHING  ? ?Items with * indicate a potential emergency and should be followed up as soon as possible or go to the Emergency Department if any problems should occur. ? ?Please show the CHEMOTHERAPY ALERT CARD or IMMUNOTHERAPY ALERT CARD at check-in  to the Emergency Department and triage nurse. ? ?Should you have questions after your visit or need to cancel or reschedule your appointment, please contact [facility]COL[facility]  Dept: [phone]  and follow the prompts.  Office hours are 8:00 a.m. to 4:30 p.m. Monday - Friday. Please note that voicemails left after 4:00 p.m. may not be returned until the following business day.  We are closed weekends and major holidays. You have access to a nurse at all times for urgent questions. Please call the main number to the clinic Dept: [phone] and follow the prompts. ? ? ?For any non-urgent questions, you may also contact your provider using MyChart. We now offer e-Visits for anyone 18 and older to request care online for non-urgent symptoms. For details visit [email or web address]. ?  ?Also download the MyChart app! Go to the app store, search "MyChart", open the app, select [facility], and log in with your MyChart username and password. ? ?Due to Covid, a mask is required upon entering the hospital/clinic. If you do not have a mask, one will be given to you upon arrival. For doctor visits, patients may have 1 support person aged 18 or older with them. For treatment visits, patients cannot have anyone with them due to current Covid guidelines and our immunocompromised population.  ? ?

## 2021-04-29 NOTE — Progress Notes (Signed)
[place]   Telephone:(336) [phone] Fax:(336) [phone]   Clinic Follow up Note   Patient Care Team: [name], DO as PCP - General (Family Medicine) [name], RN (Inactive) as Oncology Nurse Navigator [name], MD as Consulting Physician (Oncology) [name], MD as Consulting Physician (Gastroenterology)  Date of Service:  04/29/2021  CHIEF COMPLAINT: f/u of pancreatic cancer  CURRENT THERAPY:  First-line Gemcitabine and Abraxane every 2 weeks starting 10/07/20. C1 given with Gemcitabine alone. Changed to 2 weeks on/1 week off 01/19/21. Changed to every 2 weeks with C10 (03/09/21)  ASSESSMENT & PLAN:  Tina Patel is a 81 y.o. female with   1. 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 Her initial EUS on 09/19/20 showed mass at head of pancreas, but biopsy was negative. She had ERCP stent placement same day. -repeat EUS biopsy on 09/26/20 showed adenocarcinoma from pancreatic primary. Her CT CAP showed mass to be 4.5cm, no evidence of metastatic disease, benign cystic liver lesions.   -She began neoadjuvant gemcitabine 2 weeks on/1 week off on 10/07/20, Abraxane was added with cycle 2 and changed to every 2 weeks.  She has been tolerating very well with relatively no side effects  -Restaging CT 01/06/21 at [facility] showed similar pancreatic head mass with unchanged associated vascular involvement and stable liver lesions which remain indeterminate.  No evidence of progression. Dr. Mariah Milling recommended an additional 3 months of chemo -I changed her gem/Abraxane to 2 weeks on/1 week off starting 01/19/21. However due to significant fatigue and leg weakness, treatment was changed to every 2 weeks starting with cycle 10 (03/09/21).  -she previously told me she does not want surgery; she would prefer to continue chemo. We discussed this again today. I will order  CT scan to be done in the near future. -lab reviewed, adequate for treatment, will proceed with chemotherapy today. -will continue gemcitabine every 2 weeks for now, if her performance status improves, may change to day 1, 8 every 21 days   2. Symptom Management: Weight loss, Fatigue, Diarrhea -She has low appetite, nausea and mild early satiety. She forces herself to remain active at home. -Continue mirtazapine and nutritional supplements. Weight stable lately. -Follow-up with dietitian   3. 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 Genetic Testing -Given her pancreatic cancer she is eligible for genetic testing. She is interested.    5. Social Support -She has great social support from her husband 81 yo) who she lives with and her 3 children who live in town.      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   SUMMARY OF ONCOLOGIC HISTORY: Oncology History Overview Note  Cancer Staging Pancreatic cancer [facility]) Staging form: Exocrine Pancreas, AJCC 8th Edition - Clinical stage from 09/26/2020: Stage IB (cT2, cN0, cM0) - Signed by [name], MD on 09/29/2020 Stage prefix: Initial diagnosis Total positive nodes: 0    [place]ancreatic [place]  09/18/2020 Imaging   US Abdomen  IMPRESSION: Intrahepatic and extrahepatic biliary ductal dilatation. Common bile duct dilated up to 13 mm. This could be further evaluated with MRCP or ERCP.   Sludge within the gallbladder with associated  gallbladder wall thickening and pericholecystic fluid. Cannot exclude acute cholecystitis.   Multiple hepatic cysts. Complex cystic area posteriorly in the right hepatic lobe. This could also be further evaluated with MRI.   09/19/2020 Procedure    EUS and ERCP by Dr Benson Norway   EUS IMPRESSION - A mass was identified in the pancreatic head. The staging applies if malignancy is confirmed. Fine needle aspiration performed. - There was dilation in the common hepatic duct which measured up to 13 mm. - There was dilation in the intrahepatic bile ducts, diffusely. - A cyst was found in the left lobe of the liver and measured 24 mm by 15 mm. - Endosonographic images of the left adrenal gland were unremarkable.  ERCP IMPRESSION - The major papilla appeared normal. - A biliary sphincterotomy was performed. - Cells for cytology obtained distal CBD. - One covered metal stent was placed into the common bile duct.     09/19/2020 Pathology Results   A. PANCREAS, HEAD, FINE NEEDLE ASPIRATION:  - Benign reactive/reparative changes  - Bland spindle cell fragments   DIAGNOSTIC COMMENTS:  There are fragments of bland spindle cells, favored to represent smooth muscle tissue. The glandular fragments are also bland and thus, there is no definitive malignancy identified   B. BILIARY, STRICTURE, BRUSHING:  - Benign reactive/reparative changes   09/20/2020 Imaging   MRI Abdomen  IMPRESSION: 1. 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 Recommend ERCP/EUS or tissue sampling. 2. There is mild biliary ductal dilatation of the common hepatic bile duct and the intrahepatic bile ducts in the LEFT hepatic lobe. Sequences are severely degraded by patient motion. Concern for obstruction of the common bile duct by the pancreatic head mass. 3. Multiple cystic lesions throughout the liver parenchyma. Assessment of enhancement is difficult due to patient motion as well as no true noncontrast T1 weighted imaging as described above. Favor complex benign cysts but consider liver protocol contrast CT for further evaluation of the cystic lesions to exclude metastasis.    09/21/2020 Imaging   CT CAP  IMPRESSION: 1. Heterogeneous mass of the central pancreatic head measuring 4.5 x 3.3 cm. There is atrophy of the distal pancreatic parenchyma with diffuse pancreatic ductal dilatation up to 0.8 cm. Mass appears to closely abut the lateral surface of the portal vein near the confluence. The superior mesenteric artery is well separated by a fat plane, as is the hepatic artery. 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 The exact borders and size of the mass are better delineated by prior MR. 2. No evidence of metastatic disease in the chest, abdomen, or pelvis. 3. There are numerous lobulated, fluid attenuating cysts throughout the hepatic parenchyma, numerous additional subcentimeter lesions are too small to characterize although demonstrate no evidence of contrast enhancement and are likely additional subcentimeter cysts. Attention on follow-up. 4. Status post common bile duct stent with post stenting pneumobilia. 5. Thickening and fat stranding about the gallbladder as well as about the adjacent duodenal bulb and descending duodenum, of uncertain etiology, concerning for cholecystitis, enteritis, and/or pancreatitis, possibly related to recent endoscopic procedure. Aortic Atherosclerosis (ICD10-I70.0).   09/26/2020 Cancer Staging   Staging form: Exocrine Pancreas, AJCC 8th Edition - Clinical stage from 09/26/2020: Stage IIA (cT3, cN0, cM0) - Signed by [name], MD on 09/29/2020 Stage prefix: Initial diagnosis Total positive nodes: 0    09/26/2020 Procedure   EUS by Dr Benson Norway  IMPRESSION - A mass was  identified in the pancreatic head. Cytology results are pending. However, the endosonographic appearance is consistent with adenocarcinoma. Fine needle aspiration performed.   09/26/2020 Initial Biopsy   A. PANCREAS, HEAD, FINE NEEDLE ASPIRATION:  - Malignant cells consistent with adenocarcinoma    09/29/2020 Initial Diagnosis   Pancreatic cancer  ([place])   10/07/2020 -  Chemotherapy   First-line Gemcitabine and Abraxane 2 weeks on/1 week off starting 10/07/20.  C1 given with Gemcitabine alone.  Abraxane added with cycle 2 and changed to every 2 weeks    Genetic Testing   Negative genetic testing. No pathogenic variants identified on the [facility] CancerNext-Expanded+RNA panel. The report date is 10/16/2020.  The CancerNext-Expanded + RNAinsight gene panel offered by [employer] and includes sequencing and rearrangement analysis for the following 77 genes: IP, ALK, APC*, ATM*, AXIN2, BAP1, BARD1, BLM, BMPR1A, BRCA1*, BRCA2*, BRIP1*, CDC73, CDH1*,CDK4, CDKN1B, CDKN2A, CHEK2*, CTNNA1, DICER1, FANCC, FH, FLCN, GALNT12, KIF1B, LZTR1, MAX, MEN1, MET, MLH1*, MSH2*, MSH3, MSH6*, MUTYH*, NBN, NF1*, NF2, NTHL1, PALB2*, PHOX2B, PMS2*, POT1, PRKAR1A, PTCH1, PTEN*, RAD51C*, RAD51D*,RB1, RECQL, RET, SDHA, SDHAF2, SDHB, SDHC, SDHD, SMAD4, SMARCA4, SMARCB1, SMARCE1, STK11, SUFU, TMEM127, TP53*,TSC1, TSC2, VHL and XRCC2 (sequencing and deletion/duplication); EGFR, EGLN1, HOXB13, KIT, MITF, PDGFRA, POLD1 and POLE (sequencing only); EPCAM and GREM1 (deletion/duplication only).   01/06/2021 Imaging   CT CAP at [facility] Impression:  1.  Similar pancreatic head mass with unchanged associated vascular involvement and pancreatic ductal dilatation.  2.  Stable multifocal hypodense liver lesions, some of which are indeterminate. Consider MR liver with and without IV contrast for further evaluation.  3.  No evidence of new or increasing metastatic disease in the chest, abdomen or pelvis.       INTERVAL HISTORY:  Tina Patel is here for a follow up of pancreatic cancer. She was last seen by me on 04/15/21. She presents to the clinic accompanied by her husband. He reports both her legs remain weak. She reports tingling/numbness to her feet/toes; she denies any issues in her fingers.   All other systems were reviewed with the patient and are negative.  MEDICAL HISTORY:   Past Medical History:  Diagnosis Date   Diabetes ([place])    Diabetes mellitus    Family history of colon cancer    Family history of lung cancer    Family history of multiple myeloma    Family history of prostate cancer    High cholesterol    Hypertension    Hypothyroid     SURGICAL HISTORY: Past Surgical History:  Procedure Laterality Date   BILIARY BRUSHING  09/19/2020   Procedure: BILIARY BRUSHING;  Surgeon: [name], MD;  Location: [facility];  Service: Endoscopy;;   BILIARY STENT PLACEMENT N/A 09/19/2020   Procedure: BILIARY STENT PLACEMENT;  Surgeon: [name], MD;  Location: [facility];  Service: Endoscopy;  Laterality: N/A;   ERCP N/A 09/19/2020   Procedure: ENDOSCOPIC RETROGRADE CHOLANGIOPANCREATOGRAPHY (ERCP);  Surgeon: [name], MD;  Location: [facility];  Service: Endoscopy;  Laterality: N/A;   ESOPHAGOGASTRODUODENOSCOPY (EGD) WITH PROPOFOL N/A 09/19/2020   Procedure: ESOPHAGOGASTRODUODENOSCOPY (EGD) WITH PROPOFOL;  Surgeon: [name], MD;  Location: [facility];  Service: Endoscopy;  Laterality: N/A;   ESOPHAGOGASTRODUODENOSCOPY (EGD) WITH PROPOFOL N/A 09/26/2020   Procedure: ESOPHAGOGASTRODUODENOSCOPY (EGD) WITH PROPOFOL;  Surgeon: [name], MD;  Location: [facility];  Service: Endoscopy;  Laterality: N/A;   EUS N/A 09/19/2020   Procedure: UPPER ENDOSCOPIC ULTRASOUND (EUS) LINEAR;  Surgeon: [name], MD;  Location: [facility];  Service: Endoscopy;  Laterality: N/A;   FINE NEEDLE ASPIRATION  09/19/2020   Procedure: FINE NEEDLE ASPIRATION;  Surgeon: [name], MD;  Location: [facility];  Service: Endoscopy;;   FINE NEEDLE ASPIRATION N/A 09/26/2020   Procedure: FINE NEEDLE ASPIRATION (FNA) LINEAR;  Surgeon: [name], MD;  Location: [facility];  Service: Endoscopy;  Laterality: N/A;   FINGER SURGERY     IR IMAGING GUIDED PORT INSERTION  11/24/2020   SPHINCTEROTOMY  09/19/2020   Procedure: SPHINCTEROTOMY;  Surgeon: [name], MD;   Location: [facility];  Service: Endoscopy;;   UPPER ESOPHAGEAL ENDOSCOPIC ULTRASOUND (EUS) N/A 09/26/2020   Procedure: UPPER ESOPHAGEAL ENDOSCOPIC ULTRASOUND (EUS);  Surgeon: [name], MD;  Location: [facility];  Service: Endoscopy;  Laterality: N/A;    I have reviewed the social history and family history with the patient and they are unchanged from previous note.  ALLERGIES:  has No Known Allergies.  MEDICATIONS:  Current Outpatient Medications  Medication Sig Dispense Refill   amLODipine (NORVASC) 5 MG tablet Take 5 mg by mouth daily.     blood glucose meter kit and supplies KIT Dispense based on patient and insurance preference. Use up to four times daily as directed. 1 each 0   calcium carbonate (TUMS - DOSED IN MG ELEMENTAL CALCIUM) 500 MG chewable tablet Chew 500 mg by mouth daily as needed for indigestion or heartburn.     furosemide (LASIX) 20 MG tablet Take 20 mg by mouth daily.     insulin glargine (LANTUS) 100 UNIT/ML Solostar Pen Inject 8 Units into the skin daily. 15 mL 0   levothyroxine (SYNTHROID) 75 MCG tablet Take 75 mcg by mouth daily before breakfast.     lidocaine-prilocaine (EMLA) cream Apply 1 application topically as needed. 30 g 2   lisinopril (ZESTRIL) 10 MG tablet Take 10 mg by mouth daily.     metFORMIN (GLUCOPHAGE-XR) 500 MG 24 hr tablet Take 500 mg by mouth 2 (two) times daily.     mirtazapine (REMERON) 7.5 MG tablet TAKE 1 TABLET BY MOUTH EVERYDAY AT BEDTIME 90 tablet 1   ondansetron (ZOFRAN) 8 MG tablet Take 1 tablet (8 mg total) by mouth 2 (two) times daily as needed (Nausea or vomiting). 30 tablet 1   pantoprazole (PROTONIX) 20 MG tablet Take 1 tablet (20 mg total) by mouth daily. 30 tablet 1   prochlorperazine (COMPAZINE) 10 MG tablet Take 1 tablet (10 mg total) by mouth every 6 (six) hours as needed (Nausea or vomiting). 30 tablet 1   traMADol (ULTRAM) 50 MG tablet Take 1 tablet (50 mg total) by mouth every 12 (twelve) hours as needed. 30 tablet 0    Vitamin D, Ergocalciferol, 50 MCG (2000 UT) CAPS Take 2,000 Units by mouth daily.     No current facility-administered medications for this visit.    PHYSICAL EXAMINATION: ECOG PERFORMANCE STATUS: 2 - Symptomatic, <50% confined to bed  Vitals:   04/29/21 1216  BP: (!) 145/79  Pulse: 80  Resp: 17  Temp: 98.1 F (36.7 C)  SpO2: 100%   Wt Readings from Last 3 Encounters:  04/29/21 138 lb 4.8 oz (62.7 kg)  04/15/21 138 lb (62.6 kg)  03/23/21 137 lb 8 oz (62.4 kg)     GENERAL:alert, no distress and comfortable SKIN: skin color normal, no rashes or significant lesions EYES: normal, Conjunctiva are pink and non-injected, sclera clear  NEURO: alert & oriented x 3 with fluent speech  LABORATORY DATA:  I have reviewed the data as listed CBC Latest  Ref Rng & Units 04/29/2021 04/15/2021 03/23/2021  WBC 4.0 - 10.5 K/uL 4.0 4.8 4.3  Hemoglobin 12.0 - 15.0 g/dL 10.0(L) 9.4(L) 8.8(L)  Hematocrit 36.0 - 46.0 % 30.2(L) 30.3(L) 27.3(L)  Platelets 150 - 400 K/uL 232 277 237     CMP Latest Ref Rng & Units 04/29/2021 04/15/2021 03/23/2021  Glucose 70 - 99 mg/dL 130(H) 177(H) 221(H)  BUN 8 - 23 mg/dL 13 13 10   Creatinine 0.44 - 1.00 mg/dL 0.80 0.76 0.83  Sodium 135 - 145 mmol/L 139 140 138  Potassium 3.5 - 5.1 mmol/L 4.2 3.8 3.9  Chloride 98 - 111 mmol/L 106 107 106  CO2 22 - 32 mmol/L 27 27 22   Calcium 8.9 - 10.3 mg/dL 9.1 8.6(L) 8.3(L)  Total Protein 6.5 - 8.1 g/dL 6.9 6.5 6.4(L)  Total Bilirubin 0.3 - 1.2 mg/dL 0.3 0.3 0.3  Alkaline Phos 38 - 126 U/L 74 77 112  AST 15 - 41 U/L 19 18 22   ALT 0 - 44 U/L 10 9 14       RADIOGRAPHIC STUDIES: I have personally reviewed the radiological images as listed and agreed with the findings in the report. No results found.    Orders Placed This Encounter  Procedures   CT CHEST ABDOMEN PELVIS W CONTRAST    Standing Status:   Future    Standing Expiration Date:   04/29/2022    Order Specific Question:   Preferred imaging location?     Answer:   [facility]    Order Specific Question:   Release to patient    Answer:   Immediate    Order Specific Question:   Is Oral Contrast requested for this exam?    Answer:   Yes, Per Radiology protocol   All questions were answered. The patient knows to call the clinic with any problems, questions or concerns. No barriers to learning was detected. The total time spent in the appointment was 30 minutes.     [name], MD 04/29/2021   I, Wilburn Mylar, am acting as scribe for [name], MD.   I have reviewed the above documentation for accuracy and completeness, and I agree with the above.

## 2021-05-09 ENCOUNTER — Other Ambulatory Visit: Payer: Self-pay

## 2021-05-09 ENCOUNTER — Ambulatory Visit (HOSPITAL_BASED_OUTPATIENT_CLINIC_OR_DEPARTMENT_OTHER)
Admission: RE | Admit: 2021-05-09 | Discharge: 2021-05-09 | Disposition: A | Discharge status: Home / Self Care | Payer: Medicare Other | Source: Ambulatory Visit | Attending: Hematology | Admitting: Hematology

## 2021-05-09 DIAGNOSIS — C250 Malignant neoplasm of head of pancreas: Secondary | ICD-10-CM | POA: Insufficient documentation

## 2021-05-09 MED ORDER — IOHEXOL 300 MG/ML  SOLN
100.0000 mL | Freq: Once | INTRAMUSCULAR | Status: AC | PRN
  Administered 2021-05-09: 100 mL via INTRAVENOUS

## 2021-05-13 ENCOUNTER — Other Ambulatory Visit: Payer: Self-pay

## 2021-05-13 ENCOUNTER — Inpatient Hospital Stay: Payer: Medicare Other

## 2021-05-13 ENCOUNTER — Inpatient Hospital Stay: Payer: Medicare Other | Admitting: Hematology

## 2021-05-13 VITALS — BP 143/75 | HR 94 | Temp 98.5°F | Resp 18 | Ht 66.0 in | Wt 136.8 lb

## 2021-05-13 DIAGNOSIS — C250 Malignant neoplasm of head of pancreas: Secondary | ICD-10-CM

## 2021-05-13 DIAGNOSIS — Z5111 Encounter for antineoplastic chemotherapy: Secondary | ICD-10-CM | POA: Diagnosis not present

## 2021-05-13 DIAGNOSIS — Z95828 Presence of other vascular implants and grafts: Secondary | ICD-10-CM

## 2021-05-13 LAB — CBC WITH DIFFERENTIAL (CANCER CENTER ONLY)
Abs Immature Granulocytes: 0.02 10*3/uL (ref 0.00–0.07)
Basophils Absolute: 0 10*3/uL (ref 0.0–0.1)
Basophils Relative: 1 %
Eosinophils Absolute: 0.1 10*3/uL (ref 0.0–0.5)
Eosinophils Relative: 2 %
HCT: 32.2 % — ABNORMAL LOW (ref 36.0–46.0)
Hemoglobin: 10.4 g/dL — ABNORMAL LOW (ref 12.0–15.0)
Immature Granulocytes: 1 %
Lymphocytes Relative: 29 %
Lymphs Abs: 1.3 10*3/uL (ref 0.7–4.0)
MCH: 28.5 pg (ref 26.0–34.0)
MCHC: 32.3 g/dL (ref 30.0–36.0)
MCV: 88.2 fL (ref 80.0–100.0)
Monocytes Absolute: 0.5 10*3/uL (ref 0.1–1.0)
Monocytes Relative: 12 %
Neutro Abs: 2.4 10*3/uL (ref 1.7–7.7)
Neutrophils Relative %: 55 %
Platelet Count: 201 10*3/uL (ref 150–400)
RBC: 3.65 MIL/uL — ABNORMAL LOW (ref 3.87–5.11)
RDW: 15.6 % — ABNORMAL HIGH (ref 11.5–15.5)
WBC Count: 4.3 10*3/uL (ref 4.0–10.5)
nRBC: 0 % (ref 0.0–0.2)

## 2021-05-13 LAB — CMP (CANCER CENTER ONLY)
ALT: 11 U/L (ref 0–44)
AST: 22 U/L (ref 15–41)
Albumin: 3.8 g/dL (ref 3.5–5.0)
Alkaline Phosphatase: 78 U/L (ref 38–126)
Anion gap: 6 (ref 5–15)
BUN: 15 mg/dL (ref 8–23)
CO2: 27 mmol/L (ref 22–32)
Calcium: 9.1 mg/dL (ref 8.9–10.3)
Chloride: 104 mmol/L (ref 98–111)
Creatinine: 0.89 mg/dL (ref 0.44–1.00)
GFR, Estimated: >60 mL/min (ref >60)
Glucose, Bld: 192 mg/dL — ABNORMAL HIGH (ref 70–99)
Potassium: 4.5 mmol/L (ref 3.5–5.1)
Sodium: 137 mmol/L (ref 135–145)
Total Bilirubin: 0.3 mg/dL (ref 0.3–1.2)
Total Protein: 7.2 g/dL (ref 6.5–8.1)

## 2021-05-13 MED ORDER — PROCHLORPERAZINE MALEATE 10 MG PO TABS
10.0000 mg | ORAL_TABLET | Freq: Once | ORAL | Status: AC
  Administered 2021-05-13: 14:00:00 10 mg via ORAL
  Filled 2021-05-13: qty 1

## 2021-05-13 MED ORDER — SODIUM CHLORIDE 0.9 % IV SOLN
1000.0000 mg/m2 | Freq: Once | INTRAVENOUS | Status: AC
  Administered 2021-05-13: 15:00:00 1710 mg via INTRAVENOUS
  Filled 2021-05-13: qty 44.97

## 2021-05-13 MED ORDER — SODIUM CHLORIDE 0.9 % IV SOLN: Freq: Once | INTRAVENOUS | Status: AC

## 2021-05-13 MED ORDER — SODIUM CHLORIDE 0.9% FLUSH
10.0000 mL | Freq: Once | INTRAVENOUS | Status: AC
  Administered 2021-05-13: 13:00:00 10 mL

## 2021-05-13 MED ORDER — FAMOTIDINE 20 MG IN NS 100 ML IVPB
20.0000 mg | Freq: Once | INTRAVENOUS | Status: AC
  Administered 2021-05-13: 14:00:00 20 mg via INTRAVENOUS
  Filled 2021-05-13: qty 100

## 2021-05-13 MED ORDER — DIPHENHYDRAMINE HCL 25 MG PO CAPS
25.0000 mg | ORAL_CAPSULE | Freq: Once | ORAL | Status: AC
  Administered 2021-05-13: 14:00:00 25 mg via ORAL
  Filled 2021-05-13: qty 1

## 2021-05-13 MED ORDER — HEPARIN SOD (PORK) LOCK FLUSH 100 UNIT/ML IV SOLN: 500.0000 [IU] | Freq: Once | INTRAVENOUS | Status: DC

## 2021-05-13 NOTE — Patient Instructions (Signed)
[facility] MEDICAL ONCOLOGY  Discharge Instructions: ?Thank you for choosing [facility] to provide your oncology and hematology care.  ? ?If you have a lab appointment with the Cancer Center, please go directly to the Cancer Center and check in at the registration area. ?  ?Wear comfortable clothing and clothing appropriate for easy access to any Portacath or PICC line.  ? ?We strive to give you quality time with your provider. You may need to reschedule your appointment if you arrive late (15 or more minutes).  Arriving late affects you and other patients whose appointments are after yours.  Also, if you miss three or more appointments without notifying the office, you may be dismissed from the clinic at the provider?s discretion.    ?  ?For prescription refill requests, have your pharmacy contact our office and allow 72 hours for refills to be completed.   ? ?Today you received the following chemotherapy and/or immunotherapy agent: Gemzar    ?  ?To help prevent nausea and vomiting after your treatment, we encourage you to take your nausea medication as directed. ? ?BELOW ARE SYMPTOMS THAT SHOULD BE REPORTED IMMEDIATELY: ?*FEVER GREATER THAN 100.4 F (38 ?C) OR HIGHER ?*CHILLS OR SWEATING ?*NAUSEA AND VOMITING THAT IS NOT CONTROLLED WITH YOUR NAUSEA MEDICATION ?*UNUSUAL SHORTNESS OF BREATH ?*UNUSUAL BRUISING OR BLEEDING ?*URINARY PROBLEMS (pain or burning when urinating, or frequent urination) ?*BOWEL PROBLEMS (unusual diarrhea, constipation, pain near the anus) ?TENDERNESS IN MOUTH AND THROAT WITH OR WITHOUT PRESENCE OF ULCERS (sore throat, sores in mouth, or a toothache) ?UNUSUAL RASH, SWELLING OR PAIN  ?UNUSUAL VAGINAL DISCHARGE OR ITCHING  ? ?Items with * indicate a potential emergency and should be followed up as soon as possible or go to the Emergency Department if any problems should occur. ? ?Please show the CHEMOTHERAPY ALERT CARD or IMMUNOTHERAPY ALERT CARD at check-in to the  Emergency Department and triage nurse. ? ?Should you have questions after your visit or need to cancel or reschedule your appointment, please contact [facility]  Dept: [phone]  and follow the prompts.  Office hours are 8:00 a.m. to 4:30 p.m. Monday - Friday. Please note that voicemails left after 4:00 p.m. may not be returned until the following business day.  We are closed weekends and major holidays. You have access to a nurse at all times for urgent questions. Please call the main number to the clinic Dept: [phone] and follow the prompts. ? ? ?For any non-urgent questions, you may also contact your provider using MyChart. We now offer e-Visits for anyone 18 and older to request care online for non-urgent symptoms. For details visit [email or web address]. ?  ?Also download the MyChart app! Go to the app store, search "MyChart", open the app, select [facility], and log in with your MyChart username and password. ? ?Due to Covid, a mask is required upon entering the hospital/clinic. If you do not have a mask, one will be given to you upon arrival. For doctor visits, patients may have 1 support person aged 18 or older with them. For treatment visits, patients cannot have anyone with them due to current Covid guidelines and our immunocompromised population.  ? ?

## 2021-05-13 NOTE — Progress Notes (Signed)
Tina Patel   [phone] [phone]) [phone]   Clinic Follow up Note   Patient Care Team: [name], DO as PCP - General (Family Medicine) [name], RN (Inactive) as Oncology Nurse Navigator [name], MD as Consulting Physician (Oncology) [name], MD as Consulting Physician (Gastroenterology)  Date of Service:  05/13/2021  CHIEF COMPLAINT: f/u of Tina Patel  CURRENT THERAPY:  Gemcitabine 10/07/20, currently every 2 weeks.   ASSESSMENT & PLAN:  [name] is a 81 y.o. female with   1. Tina Patel, cT3N0M0, stage IIA, borderline resectable  -She presented with fatigue, weight loss and obstructive jaundice on 09/18/20. Her initial EUS on 09/19/20 showed mass at Patel of pancreas, but biopsy was negative. She had ERCP stent placement same day. 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 Her CT CAP showed mass to be 4.5cm, no evidence of metastatic disease, benign cystic liver lesions.   -She began neoadjuvant gemcitabine 2 weeks on/1 week off on 10/07/20, Abraxane was added with cycle 2 and changed to every 2 weeks.  She initially tolerated very well with relatively no side effects  -we tried to increase her gem/Abraxane to 2 weeks on/1 week off on 01/19/21, but she experienced significant fatigue and leg weakness. She returned to every 2 weeks with cycle 10 (03/09/21).  -she previously told me she does not want surgery; she would prefer to continue chemo.  -CT CAP on 05/09/21 showed stable disease, however there is suspicion for regional infiltration; no evidence of nodal or distal metastatic disease. I reviewed the results with them today. -since she declined surgery (which is very reasonable decision), I recommend her to consider consolidation radiation therapy. I feel she will benefit from this. She agrees. I will refer her to Dr. Lisbeth Renshaw. -lab reviewed, adequate for treatment, will  proceed with chemotherapy today. We will cancel her next infusion, and she will move forward with radiation.   2. 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 Genetic Testing -she underwent testing on 10/06/20, results were negative.   4.  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 Lisbeth Renshaw to be seen in 2-3 weeks  -lab, flush, and f/u in 1 month for f/u    No problem-specific Assessment & Plan notes found for this encounter.   SUMMARY OF ONCOLOGIC HISTORY: Oncology History Overview Note  Patel Staging Tina Patel [facility]) Staging form: Exocrine Pancreas, AJCC 8th Edition - Clinical stage from 09/26/2020: Stage IB (cT2, cN0, cM0) - Signed by [name], MD on 09/29/2020 Stage prefix: Initial diagnosis Total positive nodes: 0    Tina Patel ([place])  09/18/2020 Imaging   US Abdomen  IMPRESSION: Intrahepatic and extrahepatic biliary ductal dilatation. Common bile duct dilated up to 13 mm. This could be further evaluated with MRCP or ERCP.   Sludge within the gallbladder with associated gallbladder wall thickening and pericholecystic fluid. Cannot exclude acute cholecystitis.   Multiple hepatic cysts. Complex cystic area posteriorly in the right hepatic lobe. This could also be further evaluated with MRI.   09/19/2020 Procedure   EUS and ERCP by Dr Benson Norway   EUS IMPRESSION - A mass was identified in the Tina Patel. The staging applies if malignancy is confirmed. Fine needle aspiration  performed. - There was dilation in the common hepatic duct which measured up to 13 mm. - There was dilation in the intrahepatic bile ducts, diffusely. - A cyst was found in the left lobe of the liver and measured 24 mm by 15 mm. - Endosonographic images of  the left adrenal gland were unremarkable.  ERCP IMPRESSION - The major papilla appeared normal. - A biliary sphincterotomy was performed. - Cells for cytology obtained distal CBD. - One covered metal stent was placed into the common bile duct.     09/19/2020 Pathology Results   A. PANCREAS, Patel, FINE NEEDLE ASPIRATION:  - Benign reactive/reparative changes  - Bland spindle cell fragments   DIAGNOSTIC COMMENTS:  There are fragments of bland spindle cells, favored to represent smooth muscle tissue. The glandular fragments are also bland and thus, there is no definitive malignancy identified   B. BILIARY, STRICTURE, BRUSHING:  - Benign reactive/reparative changes   09/20/2020 Imaging   MRI Abdomen  IMPRESSION: 1. Mass in the Patel of the pancreas with upstream Tina duct dilatation and atrophy is highly concerning for Tina adenocarcinoma. Recommend ERCP/EUS or tissue sampling. 2. There is mild biliary ductal dilatation of the common hepatic bile duct and the intrahepatic bile ducts in the LEFT hepatic lobe. Sequences are severely degraded by patient motion. Concern for obstruction of the common bile duct by the Tina Patel mass. 3. Multiple cystic lesions throughout the liver parenchyma. Assessment of enhancement is difficult due to patient motion as well as no true noncontrast T1 weighted imaging as described above. Favor complex benign cysts but consider liver protocol contrast CT for further evaluation of the cystic lesions to exclude metastasis.   09/21/2020 Imaging   CT CAP  IMPRESSION: 1. Heterogeneous mass of the central Tina Patel measuring 4.5 x 3.3 cm. There is atrophy of the distal Tina parenchyma with diffuse Tina ductal dilatation up to 0.8 cm. Mass appears to closely abut the lateral surface of the portal vein near the confluence. The superior mesenteric artery is well separated by a fat plane, as is the hepatic artery. Findings are  consistent with Tina adenocarcinoma. The exact borders and size of the mass are better delineated by prior MR. 2. No evidence of metastatic disease in the chest, abdomen, or pelvis. 3. There are numerous lobulated, fluid attenuating cysts throughout the hepatic parenchyma, numerous additional subcentimeter lesions are too small to characterize although demonstrate no evidence of contrast enhancement and are likely additional subcentimeter cysts. Attention on follow-up. 4. Status post common bile duct stent with post stenting pneumobilia. 5. Thickening and fat stranding about the gallbladder as well as about the adjacent duodenal bulb and descending duodenum, of uncertain etiology, concerning for cholecystitis, enteritis, and/or pancreatitis, possibly related to recent endoscopic procedure. Aortic Atherosclerosis (ICD10-I70.0).   09/26/2020 Patel Staging   Staging form: Exocrine Pancreas, AJCC 8th Edition - Clinical stage from 09/26/2020: Stage IIA (cT3, cN0, cM0) - Signed by [name], MD on 09/29/2020 Stage prefix: Initial diagnosis Total positive nodes: 0    09/26/2020 Procedure   EUS by Dr Benson Norway  IMPRESSION - A mass was identified in the Tina Patel. Cytology results are pending. However, the endosonographic appearance is consistent with adenocarcinoma. Fine needle aspiration performed.   09/26/2020 Initial Biopsy   A. PANCREAS, Patel, FINE NEEDLE ASPIRATION:  - Malignant cells consistent with adenocarcinoma    09/29/2020 Initial Diagnosis   Tina Patel ([place])   10/06/2020 Genetic Testing   Negative genetic testing. No pathogenic variants identified on  the [facility] CancerNext-Expanded+RNA panel. The report date is 10/16/2020.  The CancerNext-Expanded + RNAinsight gene panel offered by [employer] and includes sequencing and rearrangement analysis for the following 77 genes: IP, ALK, APC*, ATM*, AXIN2, BAP1, BARD1, BLM, BMPR1A, BRCA1*, BRCA2*, BRIP1*, CDC73, CDH1*,CDK4,  CDKN1B, CDKN2A, CHEK2*, CTNNA1, DICER1, FANCC, FH, FLCN, GALNT12, KIF1B, LZTR1, MAX, MEN1, MET, MLH1*, MSH2*, MSH3, MSH6*, MUTYH*, NBN, NF1*, NF2, NTHL1, PALB2*, PHOX2B, PMS2*, POT1, PRKAR1A, PTCH1, PTEN*, RAD51C*, RAD51D*,RB1, RECQL, RET, SDHA, SDHAF2, SDHB, SDHC, SDHD, SMAD4, SMARCA4, SMARCB1, SMARCE1, STK11, SUFU, TMEM127, TP53*,TSC1, TSC2, VHL and XRCC2 (sequencing and deletion/duplication); EGFR, EGLN1, HOXB13, KIT, MITF, PDGFRA, POLD1 and POLE (sequencing only); EPCAM and GREM1 (deletion/duplication only).   10/07/2020 -  Chemotherapy   First-line Gemcitabine and Abraxane 2 weeks on/1 week off starting 10/07/20.  C1 given with Gemcitabine alone.  Abraxane added with cycle 2 and changed to every 2 weeks   01/06/2021 Imaging   CT CAP at [facility] Impression:  1.  Similar Tina Patel mass with unchanged associated vascular involvement and Tina ductal dilatation.  2.  Stable multifocal hypodense liver lesions, some of which are indeterminate. Consider MR liver with and without IV contrast for further evaluation.  3.  No evidence of new or increasing metastatic disease in the chest, abdomen or pelvis.    05/09/2021 Imaging   EXAM: CT CHEST, ABDOMEN, AND PELVIS WITH CONTRAST  IMPRESSION: Stable disease with stable heterogeneous mass within the Tina Patel. 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 There is, however, increasing soft tissue infiltration surrounding the superior mesenteric artery suspicious for regional infiltration of disease. No evidence of regional nodal or distant metastatic disease within the chest, abdomen, and pelvis.   Pallidum biliary stenting of the extrahepatic bile duct with secondary pneumobilia again noted.   Multiple simple and minimally complex hepatic cysts identified. No focal intrahepatic masses noted.   Distal colonic diverticulosis without superimposed acute inflammatory change.   Aortic Atherosclerosis (ICD10-I70.0).       INTERVAL HISTORY:  Tina Patel is here for a follow up of Tina Patel. She was last seen by me on 04/29/21. She presents to the clinic accompanied by her youngest daughter. Her daughter reports her mother is "wobbly," bumping up against walls. She adds her mother is "too proud" to use the walker she has.    All other systems were reviewed with the patient and are negative.  MEDICAL HISTORY:  Past Medical History:  Diagnosis Date   Diabetes ([place])    Diabetes mellitus    Family history of colon Patel    Family history of lung Patel    Family history of multiple myeloma    Family history of prostate Patel    High cholesterol    Hypertension    Hypothyroid     SURGICAL HISTORY: Past Surgical History:  Procedure Laterality Date   BILIARY BRUSHING  09/19/2020   Procedure: BILIARY BRUSHING;  Surgeon: [name], MD;  Location: [facility];  Service: Endoscopy;;   BILIARY STENT PLACEMENT N/A 09/19/2020   Procedure: BILIARY STENT PLACEMENT;  Surgeon: [name], MD;  Location: [facility];  Service: Endoscopy;  Laterality: N/A;   ERCP N/A 09/19/2020   Procedure: ENDOSCOPIC RETROGRADE CHOLANGIOPANCREATOGRAPHY (ERCP);  Surgeon: [name], MD;  Location: [facility];  Service: Endoscopy;  Laterality: N/A;   ESOPHAGOGASTRODUODENOSCOPY (EGD) WITH PROPOFOL N/A 09/19/2020   Procedure: ESOPHAGOGASTRODUODENOSCOPY (EGD) WITH PROPOFOL;  Surgeon: [name], MD;  Location: [facility];  Service: Endoscopy;  Laterality: N/A;  ESOPHAGOGASTRODUODENOSCOPY (EGD) WITH PROPOFOL N/A 09/26/2020   Procedure: ESOPHAGOGASTRODUODENOSCOPY (EGD) WITH PROPOFOL;  Surgeon: [name], MD;  Location: [facility];  Service: Endoscopy;  Laterality: N/A;   EUS N/A 09/19/2020   Procedure: UPPER ENDOSCOPIC ULTRASOUND (EUS) LINEAR;  Surgeon: [name], MD;  Location: [facility];  Service: Endoscopy;  Laterality: N/A;   FINE NEEDLE ASPIRATION  09/19/2020   Procedure: FINE NEEDLE ASPIRATION;   Surgeon: [name], MD;  Location: [facility];  Service: Endoscopy;;   FINE NEEDLE ASPIRATION N/A 09/26/2020   Procedure: FINE NEEDLE ASPIRATION (FNA) LINEAR;  Surgeon: [name], MD;  Location: [facility];  Service: Endoscopy;  Laterality: N/A;   FINGER SURGERY     IR IMAGING GUIDED PORT INSERTION  11/24/2020   SPHINCTEROTOMY  09/19/2020   Procedure: SPHINCTEROTOMY;  Surgeon: [name], MD;  Location: [facility];  Service: Endoscopy;;   UPPER ESOPHAGEAL ENDOSCOPIC ULTRASOUND (EUS) N/A 09/26/2020   Procedure: UPPER ESOPHAGEAL ENDOSCOPIC ULTRASOUND (EUS);  Surgeon: [name], MD;  Location: [facility];  Service: Endoscopy;  Laterality: N/A;    I have reviewed the social history and family history with the patient and they are unchanged from previous note.  ALLERGIES:  has No Known Allergies.  MEDICATIONS:  Current Outpatient Medications  Medication Sig Dispense Refill   amLODipine (NORVASC) 5 MG tablet Take 5 mg by mouth daily.     blood glucose meter kit and supplies KIT Dispense based on patient and insurance preference. Use up to four times daily as directed. 1 each 0   calcium carbonate (TUMS - DOSED IN MG ELEMENTAL CALCIUM) 500 MG chewable tablet Chew 500 mg by mouth daily as needed for indigestion or heartburn.     furosemide (LASIX) 20 MG tablet Take 20 mg by mouth daily.     insulin glargine (LANTUS) 100 UNIT/ML Solostar Pen Inject 8 Units into the skin daily. 15 mL 0   levothyroxine (SYNTHROID) 75 MCG tablet Take 75 mcg by mouth daily before breakfast.     lidocaine-prilocaine (EMLA) cream Apply 1 application topically as needed. 30 g 2   lisinopril (ZESTRIL) 10 MG tablet Take 10 mg by mouth daily.     metFORMIN (GLUCOPHAGE-XR) 500 MG 24 hr tablet Take 500 mg by mouth 2 (two) times daily.     mirtazapine (REMERON) 7.5 MG tablet TAKE 1 TABLET BY MOUTH EVERYDAY AT BEDTIME 90 tablet 1   ondansetron (ZOFRAN) 8 MG tablet Take 1 tablet (8 mg total) by mouth 2 (two)  times daily as needed (Nausea or vomiting). 30 tablet 1   pantoprazole (PROTONIX) 20 MG tablet Take 1 tablet (20 mg total) by mouth daily. 30 tablet 1   prochlorperazine (COMPAZINE) 10 MG tablet Take 1 tablet (10 mg total) by mouth every 6 (six) hours as needed (Nausea or vomiting). 30 tablet 1   traMADol (ULTRAM) 50 MG tablet Take 1 tablet (50 mg total) by mouth every 12 (twelve) hours as needed. 30 tablet 0   Vitamin D, Ergocalciferol, 50 MCG (2000 UT) CAPS Take 2,000 Units by mouth daily.     No current facility-administered medications for this visit.    PHYSICAL EXAMINATION: ECOG PERFORMANCE STATUS: 1 - Symptomatic but completely ambulatory  Vitals:   05/13/21 1257  BP: (!) 143/75  Pulse: 94  Resp: 18  Temp: 98.5 F (36.9 C)  SpO2: 100%   Wt Readings from Last 3 Encounters:  05/13/21 136 lb 12.8 oz (62.1 kg)  04/29/21 138 lb 4.8 oz (62.7 kg)  04/15/21 138  lb (62.6 kg)     GENERAL:alert, no distress and comfortable SKIN: skin color normal, no rashes or significant lesions EYES: normal, Conjunctiva are pink and non-injected, sclera clear  NEURO: alert & oriented x 3 with fluent speech  LABORATORY DATA:  I have reviewed the data as listed CBC Latest Ref Rng & Units 05/13/2021 04/29/2021 04/15/2021  WBC 4.0 - 10.5 K/uL 4.3 4.0 4.8  Hemoglobin 12.0 - 15.0 g/dL 10.4(L) 10.0(L) 9.4(L)  Hematocrit 36.0 - 46.0 % 32.2(L) 30.2(L) 30.3(L)  Platelets 150 - 400 K/uL 201 232 277     CMP Latest Ref Rng & Units 05/13/2021 04/29/2021 04/15/2021  Glucose 70 - 99 mg/dL 192(H) 130(H) 177(H)  BUN 8 - 23 mg/dL 15 13 13   Creatinine 0.44 - 1.00 mg/dL 0.89 0.80 0.76  Sodium 135 - 145 mmol/L 137 139 140  Potassium 3.5 - 5.1 mmol/L 4.5 4.2 3.8  Chloride 98 - 111 mmol/L 104 106 107  CO2 22 - 32 mmol/L 27 27 27   Calcium 8.9 - 10.3 mg/dL 9.1 9.1 8.6(L)  Total Protein 6.5 - 8.1 g/dL 7.2 6.9 6.5  Total Bilirubin 0.3 - 1.2 mg/dL 0.3 0.3 0.3  Alkaline Phos 38 - 126 U/L 78 74 77  AST 15 - 41 U/L  22 19 18   ALT 0 - 44 U/L 11 10 9       RADIOGRAPHIC STUDIES: I have personally reviewed the radiological images as listed and agreed with the findings in the report. No results found.    Orders Placed This Encounter  Procedures   Ambulatory referral to Radiation Oncology    Referral Priority:   Routine    Referral Type:   Consultation    Referral Reason:   Specialty Services Required    Requested Specialty:   Radiation Oncology    Number of Visits Requested:   1   All questions were answered. The patient knows to call the clinic with any problems, questions or concerns. No barriers to learning was detected. The total time spent in the appointment was 30 minutes.     [name], MD 05/13/2021   I, Wilburn Mylar, am acting as scribe for [name], MD.   I have reviewed the above documentation for accuracy and completeness, and I agree with the above.

## 2021-05-14 LAB — CANCER ANTIGEN 19-9: CA 19-9: 14 U/mL (ref 0–35)

## 2021-05-27 ENCOUNTER — Inpatient Hospital Stay: Payer: Medicare Other

## 2021-05-27 ENCOUNTER — Inpatient Hospital Stay: Payer: Medicare Other | Admitting: Hematology

## 2021-06-01 NOTE — Progress Notes (Signed)
GI Location of Tumor / Histology: Pancreas- Head  Tina Patel presented with fatigue, weight loss, and obstructive jaundice in June 2022.  CT CAP 05/09/2021: showed stable disease, however there is suspicion for regional infiltration; no evidence of nodal or distal metastatic disease.   EUS 09/26/2020: Adenocarcinoma from pancreatic primary.  CT CAP 09/21/2020: Heterogeneous mass of the central pancreatic head measuring 4.5 x 3.3 cm. There is atrophy of the distal pancreatic parenchyma with diffuse pancreatic ductal dilatation up to 0.8 cm. Mass appears to closely abut the lateral surface of the portal vein near the confluence. The superior mesenteric artery is well separated by a fat plane, as is the hepatic artery. 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  MRI Abdomen 09/20/2020: Mass in the head of the pancreas with upstream pancreatic duct dilatation and atrophy is highly concerning for pancreatic adenocarcinoma. Recommend ERCP/EUS or tissue sampling.  There is mild biliary ductal dilatation of the common hepatic bile duct and the intrahepatic bile ducts in the LEFT hepatic lobe.  Multiple cystic lesions throughout the liver parenchyma.  EUS 09/19/2020: Mass at head of pancreas, Biopsy was negative.  Biopsies of Pancreas Head Mass 09/26/2020   09/19/2020   Past/Anticipated interventions by surgeon, if any:   Past/Anticipated interventions by medical oncology, if any:  Dr. Burr Medico 05/13/2021 -She began neoadjuvant gemcitabine 2 weeks on/1 week off on 10/07/20, Abraxane was added with cycle 2 and changed to every 2 weeks.  She initially tolerated very well with relatively no side effects  -we tried to increase her gem/Abraxane to 2 weeks on/1 week off on 01/19/21, but she experienced significant fatigue and leg weakness. She returned to every 2 weeks with cycle 10 (03/09/21).  -she previously told me she does not want surgery; she would prefer to continue chemo.  -since she declined  surgery (which is very reasonable decision), I recommend her to consider consolidation radiation therapy. I feel she will benefit from this. She agrees. I will refer her to Dr. Lisbeth Renshaw. -Follow-up 06/10/2021   Weight changes, if any: Stable  Bowel/Bladder complaints, if any: Denies changes to her bladder and bowels.  Nausea / Vomiting, if any: No  Pain issues, if any: She reports pain in her stomach when she eats the wrong foods.  Appetite: Good.  SAFETY ISSUES: Prior radiation? No Pacemaker/ICD? No Possible current pregnancy? Postmenopausal Is the patient on methotrexate? No  Current Complaints/Details:

## 2021-06-01 NOTE — Progress Notes (Signed)
Radiation Oncology         [phone] ________________________________  Name: Tina Patel        MRN: [id number]  Date of Service: 06/02/2021 DOB: November 06, 1940  KN:[id number], Hinton Dyer, DO  [name], MD     REFERRING PHYSICIAN: [name], MD   DIAGNOSIS: The encounter diagnosis was Malignant neoplasm of head of pancreas [facility]).   HISTORY OF PRESENT ILLNESS: Tina Patel is a 81 y.o. female seen at the request of Dr. Burr Medico for a diagnosis of pancreatic cancer.  The patient was originally found to have symptoms of biliary obstruction and in June 2022 ultrasound of the abdomen showed intra and extrahepatic biliary ductal dilatation with dilation of the common bile duct up to 1.3 cm.  She underwent EUS with ERCP on 09/19/2020 and a mass in the pancreatic head was noted with dilation of the common hepatic duct intrahepatic ducts diffusely and cystic changes were seen in the liver measuring up to 2.4 cm.  By biliary sphincterotomy was performed and cytology was collected with the deployment also of a covered metal stent in the common bile duct.  Fine-needle aspirate and cytology showed benign reactive changes.  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  Genetic testing was performed and was negative.  She was started on Gemzar and Abraxane on 10/07/2020.  Interval imaging in September 2022 showed similar changes with unchanged vascular involvement.  She is continued on the same regimen but Abraxane has been removed from her care plan for the last 3 cycles.  Her last treatment was on 05/13/2021.  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  Given these findings she is seen to consider stereotactic body radiotherapy (SBRT).    PREVIOUS RADIATION THERAPY: No   PAST MEDICAL HISTORY:  Past Medical History:  Diagnosis Date   Diabetes ([place])    Diabetes mellitus    Family history of colon cancer    Family history of lung cancer    Family history of multiple myeloma    Family history of prostate cancer    High cholesterol    Hypertension    Hypothyroid        PAST SURGICAL HISTORY: Past Surgical History:  Procedure Laterality Date   BILIARY BRUSHING  09/19/2020   Procedure: BILIARY BRUSHING;  Surgeon: [name], MD;  Location: [facility];  Service: Endoscopy;;   BILIARY STENT PLACEMENT N/A 09/19/2020   Procedure: BILIARY STENT PLACEMENT;  Surgeon: [name], MD;  Location: [facility];  Service: Endoscopy;  Laterality: N/A;   ERCP N/A 09/19/2020   Procedure: ENDOSCOPIC RETROGRADE CHOLANGIOPANCREATOGRAPHY (ERCP);  Surgeon: [name], MD;  Location: [facility];  Service: Endoscopy;  Laterality: N/A;   ESOPHAGOGASTRODUODENOSCOPY (EGD) WITH PROPOFOL N/A 09/19/2020   Procedure: ESOPHAGOGASTRODUODENOSCOPY (EGD) WITH PROPOFOL;  Surgeon: [name], MD;  Location: [facility];  Service: Endoscopy;  Laterality: N/A;   ESOPHAGOGASTRODUODENOSCOPY (EGD) WITH PROPOFOL N/A 09/26/2020   Procedure: ESOPHAGOGASTRODUODENOSCOPY (EGD) WITH PROPOFOL;  Surgeon: [name], MD;  Location: [facility];  Service: Endoscopy;  Laterality: N/A;   EUS N/A 09/19/2020   Procedure: UPPER ENDOSCOPIC ULTRASOUND (  EUS) LINEAR;  Surgeon: [name], MD;  Location: [facility];  Service: Endoscopy;  Laterality: N/A;   FINE NEEDLE ASPIRATION  09/19/2020   Procedure: FINE NEEDLE ASPIRATION;  Surgeon: [name], MD;  Location: [facility];  Service: Endoscopy;;   FINE NEEDLE ASPIRATION N/A 09/26/2020   Procedure: FINE NEEDLE ASPIRATION (FNA) LINEAR;   Surgeon: [name], MD;  Location: [facility];  Service: Endoscopy;  Laterality: N/A;   FINGER SURGERY     IR IMAGING GUIDED PORT INSERTION  11/24/2020   SPHINCTEROTOMY  09/19/2020   Procedure: SPHINCTEROTOMY;  Surgeon: [name], MD;  Location: [facility];  Service: Endoscopy;;   UPPER ESOPHAGEAL ENDOSCOPIC ULTRASOUND (EUS) N/A 09/26/2020   Procedure: UPPER ESOPHAGEAL ENDOSCOPIC ULTRASOUND (EUS);  Surgeon: [name], MD;  Location: [facility];  Service: Endoscopy;  Laterality: N/A;     FAMILY HISTORY:  Family History  Problem Relation Age of Onset   Cancer Mother        lung cancer   Prostate cancer Father    Multiple myeloma Sister    Prostate cancer Brother    Cancer - Colon Maternal Aunt    Lung cancer Maternal Uncle    Cancer Son 35       prostate cancer     SOCIAL HISTORY:  reports that she has never smoked. She has never used smokeless tobacco. She reports that she does not drink alcohol and does not use drugs. The patient is married and lives in [place]. She is retired from working as a Regulatory affairs officer for a [employer] and her husband used to own a Warehouse [employer]. They have three adult children who live nearby.   ALLERGIES: Patient has no known allergies.   MEDICATIONS:  Current Outpatient Medications  Medication Sig Dispense Refill   amLODipine (NORVASC) 5 MG tablet Take 5 mg by mouth daily.     blood glucose meter kit and supplies KIT Dispense based on patient and insurance preference. Use up to four times daily as directed. 1 each 0   calcium carbonate (TUMS - DOSED IN MG ELEMENTAL CALCIUM) 500 MG chewable tablet Chew 500 mg by mouth daily as needed for indigestion or heartburn.     furosemide (LASIX) 20 MG tablet Take 20 mg by mouth daily.     insulin glargine (LANTUS) 100 UNIT/ML Solostar Pen Inject 8 Units into the skin daily. 15 mL 0   levothyroxine (SYNTHROID) 75 MCG tablet Take 75 mcg by mouth daily before breakfast.     lidocaine-prilocaine (EMLA) cream  Apply 1 application topically as needed. 30 g 2   lisinopril (ZESTRIL) 10 MG tablet Take 10 mg by mouth daily.     metFORMIN (GLUCOPHAGE-XR) 500 MG 24 hr tablet Take 500 mg by mouth 2 (two) times daily.     mirtazapine (REMERON) 7.5 MG tablet TAKE 1 TABLET BY MOUTH EVERYDAY AT BEDTIME 90 tablet 1   ondansetron (ZOFRAN) 8 MG tablet Take 1 tablet (8 mg total) by mouth 2 (two) times daily as needed (Nausea or vomiting). 30 tablet 1   pantoprazole (PROTONIX) 20 MG tablet Take 1 tablet (20 mg total) by mouth daily. 30 tablet 1   prochlorperazine (COMPAZINE) 10 MG tablet Take 1 tablet (10 mg total) by mouth every 6 (six) hours as needed (Nausea or vomiting). 30 tablet 1   traMADol (ULTRAM) 50 MG tablet Take 1 tablet (50 mg total) by mouth every 12 (twelve) hours as needed. 30 tablet 0   Vitamin D, Ergocalciferol, 50 MCG (2000 UT) CAPS  Take 2,000 Units by mouth daily.     No current facility-administered medications for this visit.     REVIEW OF SYSTEMS: On review of systems, the patient reports that she is doing pretty well overall. 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 She occasionally has discomfort laying flat before falling asleep and occasionally discomfort depending on certain foods she eats. No changes in bowel habits are noted. No other complaints are verbalized.      PHYSICAL EXAM:  Wt Readings from Last 3 Encounters:  05/13/21 136 lb 12.8 oz (62.1 kg)  04/29/21 138 lb 4.8 oz (62.7 kg)  04/15/21 138 lb (62.6 kg)   Temp Readings from Last 3 Encounters:  05/13/21 98.5 F (36.9 C) (Oral)  04/29/21 98.1 F (36.7 C) (Oral)  04/15/21 98.4 F (36.9 C) (Oral)   BP Readings from Last 3 Encounters:  05/13/21 (!) 143/75  04/29/21 (!) 145/79  04/15/21 134/63   Pulse Readings from Last 3 Encounters:  05/13/21 94  04/29/21 80  04/15/21 78    /10  In general this is a well appearing  African American female in no acute distress. She's alert and oriented x4 and appropriate throughout the examination. Cardiopulmonary assessment is negative for acute distress and she exhibits normal effort.     ECOG = 1  0 - Asymptomatic (Fully active, able to carry on all predisease activities without restriction)  1 - Symptomatic but completely ambulatory (Restricted in physically strenuous activity but ambulatory and able to carry out work of a light or sedentary nature. For example, light housework, office work)  2 - Symptomatic, <50% in bed during the day (Ambulatory and capable of all self care but unable to carry out any work activities. Up and about more than 50% of waking hours)  3 - Symptomatic, >50% in bed, but not bedbound (Capable of only limited self-care, confined to bed or chair 50% or more of waking hours)  4 - Bedbound (Completely disabled. Cannot carry on any self-care. Totally confined to bed or chair)  5 - Death   Eustace Pen MM, Creech RH, Tormey DC, et al. [id number]). "Toxicity and response criteria of the [facility]". [place] Oncol. 5 (6): 649-55    LABORATORY DATA:  Lab Results  Component Value Date   WBC 4.3 05/13/2021   HGB 10.4 (L) 05/13/2021   HCT 32.2 (L) 05/13/2021   MCV 88.2 05/13/2021   PLT 201 05/13/2021   Lab Results  Component Value Date   NA 137 05/13/2021   K 4.5 05/13/2021   CL 104 05/13/2021   CO2 27 05/13/2021   Lab Results  Component Value Date   ALT 11 05/13/2021   AST 22 05/13/2021   ALKPHOS 78 05/13/2021   BILITOT 0.3 05/13/2021      RADIOGRAPHY: CT CHEST ABDOMEN PELVIS W CONTRAST  Result Date: 05/10/2021 CLINICAL DATA:  Pancreatic cancer, assess treatment response EXAM: CT CHEST, ABDOMEN, AND PELVIS WITH CONTRAST TECHNIQUE: Multidetector CT imaging of the chest, abdomen and pelvis was performed following the standard protocol during bolus administration of intravenous contrast. RADIATION DOSE REDUCTION:  This exam was performed according to the departmental dose-optimization program which includes automated exposure control, adjustment of the mA and/or kV according to patient size and/or use of iterative reconstruction technique. CONTRAST:  134m OMNIPAQUE IOHEXOL 300 MG/ML  SOLN COMPARISON:  09/21/2020 FINDINGS: CT CHEST FINDINGS Cardiovascular: No significant coronary artery calcification.  Global cardiac size within normal limits. No pericardial effusion. Central pulmonary arteries are of normal caliber. Moderate atherosclerotic calcification within the thoracic aorta. No aortic aneurysm. Right internal jugular chest port tip noted within the superior right atrium. Mediastinum/Nodes: No pathologic thoracic adenopathy. Esophagus is unremarkable. Small hiatal hernia. Lungs/Pleura: Lungs are clear. No pleural effusion or pneumothorax. Musculoskeletal: No lytic or blastic bone lesion. CT ABDOMEN PELVIS FINDINGS Hepatobiliary: Multiple simple and mildly complex cysts are again seen scattered throughout both hepatic lobes, unchanged from prior examination. No enhancing intrahepatic masses are identified. Palliative biliary stenting of the distal common duct through the ampullary is again noted. Secondary pneumobilia again noted. Gallbladder unremarkable. Pancreas: The heterogeneously enhancing mass within the head of the pancreas is better visualized on prior arterial phase examination, but appears grossly unchanged measuring roughly 3.5 x 3.5 x 5.1 cm on axial image # 66/2 and coronal image # 52/5. The mass abuts the main portal vein at its confluence, similar to that noted on prior examination. There is dilation of the main pancreatic duct proximal to the mass as well as atrophy of the body and tail the pancreas. There is increasing infiltrative change noted within the mesenteric root surrounding the superior mesenteric artery, best seen on axial image # 66/2 and 68/2 which is nonspecific but may represent local  infiltrative change or perilesional edema though its focality argues against the latter. Spleen: Unremarkable Adrenals/Urinary Tract: Adrenal glands are unremarkable. Kidneys are normal, without renal calculi, focal lesion, or hydronephrosis. Bladder is unremarkable. Stomach/Bowel: Moderate descending and sigmoid colonic diverticulosis. No superimposed acute inflammatory change. Stomach, small bowel, and large bowel are otherwise unremarkable. Appendix is absent Vascular/Lymphatic: Mild atherosclerotic calcification within the aortoiliac vasculature. No aortic aneurysm. No pathologic adenopathy within the abdomen and pelvis. Reproductive: Uterus and bilateral adnexa are unremarkable. Other: No abdominal wall hernia or abnormality. No abdominopelvic ascites. Musculoskeletal: No lytic or blastic bone lesions. Degenerative changes are noted within the lumbar spine. IMPRESSION: Stable disease with stable heterogeneous mass within the pancreatic head. 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 There is, however, increasing soft tissue infiltration surrounding the superior mesenteric artery suspicious for regional infiltration of disease. No evidence of regional nodal or distant metastatic disease within the chest, abdomen, and pelvis. Pallidum biliary stenting of the extrahepatic bile duct with secondary pneumobilia again noted. Multiple simple and minimally complex hepatic cysts identified. No focal intrahepatic masses noted. Distal colonic diverticulosis without superimposed acute inflammatory change. Aortic Atherosclerosis (ICD10-I70.0). Electronically Signed   By: Fidela Salisbury M.D.   On: 05/10/2021 04:20       IMPRESSION/PLAN: 1. Stage IIA, cT3N0M0, adenocarcinoma of the pancreatic head. Dr. Lisbeth Renshaw discusses the patient's work-up and course to date.  While she was initially felt to be borderline resectable despite neoadjuvant chemotherapy she is likely still borderline versus unresectable at  this time.  She has declined surgical intervention as well.  Dr. Lisbeth Renshaw reviews the rationale for local control with radiotherapy and offers her a course of stereotactic body radiotherapy (SBRT). He does feel that fiducial markers would assist in her treatment. We will reach out to Dr. Benson Norway before referring her to [facility] for fiducial marker placement request. We discussed the risks, benefits, short, and long term effects of radiotherapy, as well as the definitive intent, and the patient is interested in proceeding. Dr. Lisbeth Renshaw discusses the delivery and logistics of radiotherapy and anticipates a course of 5 fractions of radiotherapy. Written consent is obtained and placed in the chart,  a copy was provided to the patient.   In a visit lasting 60 minutes, greater than 50% of the time was spent face to face with Dr. Lisbeth Renshaw (and myself on Webex remotely) discussing the patient's condition, in preparation for the discussion, and coordinating the patient's care.   The above documentation reflects my direct findings during this shared patient visit. Please see the separate note by Dr. Lisbeth Renshaw on this date for the remainder of the patient's plan of care.    Carola Rhine, [facility]   **Disclaimer: This note was dictated with voice recognition software. Similar sounding words can inadvertently be transcribed and this note may contain transcription errors which may not have been corrected upon publication of note.**

## 2021-06-02 ENCOUNTER — Other Ambulatory Visit: Payer: Self-pay

## 2021-06-02 ENCOUNTER — Ambulatory Visit
Admission: RE | Admit: 2021-06-02 | Discharge: 2021-06-02 | Disposition: A | Discharge status: Home / Self Care | Payer: Medicare Other | Source: Ambulatory Visit | Attending: Radiation Oncology | Admitting: Radiation Oncology

## 2021-06-02 ENCOUNTER — Telehealth: Payer: Self-pay

## 2021-06-02 ENCOUNTER — Other Ambulatory Visit: Payer: Self-pay | Admitting: Radiation Oncology

## 2021-06-02 ENCOUNTER — Encounter: Payer: Self-pay | Admitting: Radiation Oncology

## 2021-06-02 VITALS — BP 143/88 | HR 87 | Temp 96.8°F | Resp 18 | Ht 66.0 in | Wt 134.5 lb

## 2021-06-02 DIAGNOSIS — E7800 Pure hypercholesterolemia, unspecified: Secondary | ICD-10-CM | POA: Insufficient documentation

## 2021-06-02 DIAGNOSIS — K449 Diaphragmatic hernia without obstruction or gangrene: Secondary | ICD-10-CM | POA: Diagnosis not present

## 2021-06-02 DIAGNOSIS — Z7984 Long term (current) use of oral hypoglycemic drugs: Secondary | ICD-10-CM | POA: Diagnosis not present

## 2021-06-02 DIAGNOSIS — C250 Malignant neoplasm of head of pancreas: Secondary | ICD-10-CM

## 2021-06-02 DIAGNOSIS — Z794 Long term (current) use of insulin: Secondary | ICD-10-CM | POA: Diagnosis not present

## 2021-06-02 DIAGNOSIS — E039 Hypothyroidism, unspecified: Secondary | ICD-10-CM | POA: Diagnosis not present

## 2021-06-02 DIAGNOSIS — I10 Essential (primary) hypertension: Secondary | ICD-10-CM | POA: Diagnosis not present

## 2021-06-02 DIAGNOSIS — Z801 Family history of malignant neoplasm of trachea, bronchus and lung: Secondary | ICD-10-CM | POA: Diagnosis not present

## 2021-06-02 DIAGNOSIS — E119 Type 2 diabetes mellitus without complications: Secondary | ICD-10-CM | POA: Insufficient documentation

## 2021-06-02 DIAGNOSIS — K5730 Diverticulosis of large intestine without perforation or abscess without bleeding: Secondary | ICD-10-CM | POA: Diagnosis not present

## 2021-06-02 DIAGNOSIS — Z806 Family history of leukemia: Secondary | ICD-10-CM | POA: Diagnosis not present

## 2021-06-02 DIAGNOSIS — Z8042 Family history of malignant neoplasm of prostate: Secondary | ICD-10-CM | POA: Diagnosis not present

## 2021-06-02 DIAGNOSIS — Z800 Family history of malignant neoplasm of digestive organs: Secondary | ICD-10-CM | POA: Diagnosis not present

## 2021-06-02 DIAGNOSIS — Z79899 Other long term (current) drug therapy: Secondary | ICD-10-CM | POA: Insufficient documentation

## 2021-06-02 NOTE — Progress Notes (Signed)
Orders were placed to [facility] for urgent referal for pancreatic fiducial marker placement as this is not done at [facility] confirmed by pt's attending.

## 2021-06-02 NOTE — Telephone Encounter (Signed)
-----   Message from Milus Banister, MD sent at 06/02/2021  2:12 PM EST ----- We are happy to help but please understand that we have very limited time for outpatient procedures at the hospital given "critical staffing shortages" in endoscopy.    Thadd Apuzzo, Please offer this patient first available upper endoscopic ultrasound procedure with myself or Gabe for pancreatic cancer, fiducial placement.    ----- Message ----- From: Hayden Pedro, PA-C Sent: 06/02/2021   1:58 PM EST To: Milus Banister, MD, Timothy Lasso, RN, #  I've checked in with Dr. Benson Norway and waiting to confirm that he doesn't place ficudials. Could ya'll see this pt for [place] if he can't place them? Her last chemo was 05/13/21 so it's been a while so we'd like to try to move forward soon. Thanks! Bryson Ha

## 2021-06-02 NOTE — Telephone Encounter (Signed)
Will you let me know if Dr Benson Norway is not doing the case?  The next available time we have is going to be in April.

## 2021-06-02 NOTE — Telephone Encounter (Signed)
The pt has been scheduled for EUS Fiducial's on 3/16 at [facility] at 1115 am with GM.    EUS scheduled, pt husband instructed and medications reviewed.  Patient instructions mailed to home and sent to My Chart.  Patient to call with any questions or concerns.

## 2021-06-03 ENCOUNTER — Telehealth: Payer: Self-pay | Admitting: *Deleted

## 2021-06-03 NOTE — Telephone Encounter (Signed)
Per Dr.Feng, called pt to ask if she wanted to have an infusion of Gemzar on next appt. Due to pt being hard of hearing, husband asked and pt replied, "yes." Charge nurse Roxanne Gates ok to add infusion. Pt was scheduled, and called with time and date.

## 2021-06-10 ENCOUNTER — Encounter: Payer: Self-pay | Admitting: Hematology

## 2021-06-10 ENCOUNTER — Inpatient Hospital Stay: Payer: Medicare Other

## 2021-06-10 ENCOUNTER — Other Ambulatory Visit: Payer: Self-pay

## 2021-06-10 ENCOUNTER — Inpatient Hospital Stay: Payer: Medicare Other | Admitting: Hematology

## 2021-06-10 ENCOUNTER — Inpatient Hospital Stay: Payer: Medicare Other | Attending: Hematology

## 2021-06-10 VITALS — BP 141/74 | HR 85 | Temp 97.9°F | Resp 18 | Ht 66.0 in | Wt 136.5 lb

## 2021-06-10 DIAGNOSIS — C250 Malignant neoplasm of head of pancreas: Secondary | ICD-10-CM | POA: Insufficient documentation

## 2021-06-10 DIAGNOSIS — Z95828 Presence of other vascular implants and grafts: Secondary | ICD-10-CM

## 2021-06-10 DIAGNOSIS — Z5111 Encounter for antineoplastic chemotherapy: Secondary | ICD-10-CM | POA: Insufficient documentation

## 2021-06-10 LAB — CBC WITH DIFFERENTIAL (CANCER CENTER ONLY)
Abs Immature Granulocytes: 0.01 10*3/uL (ref 0.00–0.07)
Basophils Absolute: 0 10*3/uL (ref 0.0–0.1)
Basophils Relative: 1 %
Eosinophils Absolute: 0.1 10*3/uL (ref 0.0–0.5)
Eosinophils Relative: 2 %
HCT: 32.7 % — ABNORMAL LOW (ref 36.0–46.0)
Hemoglobin: 10.6 g/dL — ABNORMAL LOW (ref 12.0–15.0)
Immature Granulocytes: 0 %
Lymphocytes Relative: 28 %
Lymphs Abs: 1.1 10*3/uL (ref 0.7–4.0)
MCH: 28.3 pg (ref 26.0–34.0)
MCHC: 32.4 g/dL (ref 30.0–36.0)
MCV: 87.2 fL (ref 80.0–100.0)
Monocytes Absolute: 0.4 10*3/uL (ref 0.1–1.0)
Monocytes Relative: 9 %
Neutro Abs: 2.4 10*3/uL (ref 1.7–7.7)
Neutrophils Relative %: 60 %
Platelet Count: 271 10*3/uL (ref 150–400)
RBC: 3.75 MIL/uL — ABNORMAL LOW (ref 3.87–5.11)
RDW: 14.9 % (ref 11.5–15.5)
WBC Count: 4 10*3/uL (ref 4.0–10.5)
nRBC: 0 % (ref 0.0–0.2)

## 2021-06-10 LAB — CMP (CANCER CENTER ONLY)
ALT: 9 U/L (ref 0–44)
AST: 16 U/L (ref 15–41)
Albumin: 3.7 g/dL (ref 3.5–5.0)
Alkaline Phosphatase: 66 U/L (ref 38–126)
Anion gap: 6 (ref 5–15)
BUN: 20 mg/dL (ref 8–23)
CO2: 27 mmol/L (ref 22–32)
Calcium: 8.9 mg/dL (ref 8.9–10.3)
Chloride: 105 mmol/L (ref 98–111)
Creatinine: 0.9 mg/dL (ref 0.44–1.00)
GFR, Estimated: >60 mL/min (ref >60)
Glucose, Bld: 223 mg/dL — ABNORMAL HIGH (ref 70–99)
Potassium: 4.3 mmol/L (ref 3.5–5.1)
Sodium: 138 mmol/L (ref 135–145)
Total Bilirubin: 0.4 mg/dL (ref 0.3–1.2)
Total Protein: 6.6 g/dL (ref 6.5–8.1)

## 2021-06-10 MED ORDER — FAMOTIDINE IN NACL 20-0.9 MG/50ML-% IV SOLN
20.0000 mg | Freq: Once | INTRAVENOUS | Status: AC
  Administered 2021-06-10: 20 mg via INTRAVENOUS
  Filled 2021-06-10: qty 50

## 2021-06-10 MED ORDER — SODIUM CHLORIDE 0.9 % IV SOLN: Freq: Once | INTRAVENOUS | Status: AC

## 2021-06-10 MED ORDER — DIPHENHYDRAMINE HCL 25 MG PO CAPS
25.0000 mg | ORAL_CAPSULE | Freq: Once | ORAL | Status: AC
  Administered 2021-06-10: 25 mg via ORAL
  Filled 2021-06-10: qty 1

## 2021-06-10 MED ORDER — SODIUM CHLORIDE 0.9% FLUSH
10.0000 mL | Freq: Once | INTRAVENOUS | Status: AC
  Administered 2021-06-10: 10 mL

## 2021-06-10 MED ORDER — SODIUM CHLORIDE 0.9% FLUSH: 10.0000 mL | INTRAVENOUS | Status: DC | PRN

## 2021-06-10 MED ORDER — HEPARIN SOD (PORK) LOCK FLUSH 100 UNIT/ML IV SOLN: 500.0000 [IU] | Freq: Once | INTRAVENOUS | Status: DC | PRN

## 2021-06-10 MED ORDER — PROCHLORPERAZINE MALEATE 10 MG PO TABS
10.0000 mg | ORAL_TABLET | Freq: Once | ORAL | Status: AC
  Administered 2021-06-10: 10 mg via ORAL
  Filled 2021-06-10: qty 1

## 2021-06-10 MED ORDER — SODIUM CHLORIDE 0.9 % IV SOLN
1000.0000 mg/m2 | Freq: Once | INTRAVENOUS | Status: AC
  Administered 2021-06-10: 1710 mg via INTRAVENOUS
  Filled 2021-06-10: qty 44.97

## 2021-06-10 NOTE — Progress Notes (Signed)
[place]   Telephone:(336) [phone] Fax:(336) [phone]   Clinic Follow up Note   Patient Care Team: [name], DO as PCP - General (Family Medicine) [name], RN (Inactive) as Oncology Nurse Navigator [name], MD as Consulting Physician (Oncology) [name], MD as Consulting Physician (Gastroenterology)  Date of Service:  06/10/2021  CHIEF COMPLAINT: f/u of pancreatic cancer  CURRENT THERAPY:  Gemcitabine 10/07/20, currently every 2 weeks.   ASSESSMENT & PLAN:  Tina Patel is a 81 y.o. female with   1. 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 Her initial EUS on 09/19/20 showed mass at head of pancreas, but biopsy was negative. She had ERCP stent placement same day. -repeat EUS biopsy on 09/26/20 showed adenocarcinoma from pancreatic primary. Her CT CAP showed mass to be 4.5cm, no evidence of metastatic disease, benign cystic liver lesions.   -She began neoadjuvant gemcitabine 2 weeks on/1 week off on 10/07/20, Abraxane was added with cycle 2 and changed to every 2 weeks.  She initially tolerated very well with relatively no side effects  -we tried to increase her gem/Abraxane to 2 weeks on/1 week off on 01/19/21, but she experienced significant fatigue and leg weakness. She returned to every 2 weeks with cycle 10 (03/09/21).  -she previously told me she does not want surgery (which is very reasonable decision); she would prefer to continue chemo.  -CT CAP on 05/09/21 showed stable disease, however there is suspicion for regional infiltration; no evidence of nodal or distal metastatic disease. -she met with Dr. Lisbeth Renshaw on 06/02/21. She is scheduled for CT simulation on 07/06/21 in anticipation of beginning consolidation radiation therapy shortly after.  She is waiting for fiducial placement before RT.  -I recommended her to get 1 more dose  gemcitabine while she is waiting for radiation.  Lab reviewed, adequate for treatment, will proceed with one more gemcitabine dose today. She will then move forward with radiation. -she is scheduled for EUS on 07/02/21 with Dr. Rush Landmark.   2. 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 Genetic Testing -she underwent testing on 10/06/20, results were negative.   4.  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 Rush Landmark -CT sim on 3/20 -lab, flush, and f/u in 8 weeks  -I will order scan at that time   No problem-specific Assessment & Plan notes found for this encounter.   SUMMARY OF ONCOLOGIC HISTORY: Oncology History Overview Note  Cancer Staging Pancreatic cancer [facility]) Staging form: Exocrine Pancreas, AJCC 8th Edition - Clinical stage from 09/26/2020: Stage IB (cT2, cN0, cM0) - Signed by [name], MD on 09/29/2020 Stage prefix: Initial diagnosis Total positive nodes: 0    Pancreatic cancer ([place])  09/18/2020 Imaging   US Abdomen  IMPRESSION: Intrahepatic and extrahepatic biliary ductal dilatation. Common bile duct dilated up to 13 mm. This could be further evaluated with MRCP or ERCP.   Sludge within the gallbladder with associated gallbladder wall thickening and pericholecystic fluid. Cannot exclude acute cholecystitis.   Multiple hepatic cysts. Complex cystic area posteriorly in the right hepatic lobe. This could also be further evaluated with MRI.   09/19/2020 Procedure   EUS and  ERCP by Dr Benson Norway   EUS IMPRESSION - A mass was identified in the pancreatic head. The staging applies if malignancy is confirmed. Fine needle aspiration performed. - There was dilation in the common hepatic duct which measured up to 13 mm. - There was  dilation in the intrahepatic bile ducts, diffusely. - A cyst was found in the left lobe of the liver and measured 24 mm by 15 mm. - Endosonographic images of the left adrenal gland were unremarkable.  ERCP IMPRESSION - The major papilla appeared normal. - A biliary sphincterotomy was performed. - Cells for cytology obtained distal CBD. - One covered metal stent was placed into the common bile duct.     09/19/2020 Pathology Results   A. PANCREAS, HEAD, FINE NEEDLE ASPIRATION:  - Benign reactive/reparative changes  - Bland spindle cell fragments   DIAGNOSTIC COMMENTS:  There are fragments of bland spindle cells, favored to represent smooth muscle tissue. The glandular fragments are also bland and thus, there is no definitive malignancy identified   B. BILIARY, STRICTURE, BRUSHING:  - Benign reactive/reparative changes   09/20/2020 Imaging   MRI Abdomen  IMPRESSION: 1. 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 Recommend ERCP/EUS or tissue sampling. 2. There is mild biliary ductal dilatation of the common hepatic bile duct and the intrahepatic bile ducts in the LEFT hepatic lobe. Sequences are severely degraded by patient motion. Concern for obstruction of the common bile duct by the pancreatic head mass. 3. Multiple cystic lesions throughout the liver parenchyma. Assessment of enhancement is difficult due to patient motion as well as no true noncontrast T1 weighted imaging as described above. Favor complex benign cysts but consider liver protocol contrast CT for further evaluation of the cystic lesions to exclude metastasis.   09/21/2020 Imaging   CT CAP  IMPRESSION: 1. Heterogeneous mass of the central pancreatic head measuring 4.5 x 3.3 cm. There is atrophy of the distal pancreatic parenchyma with diffuse pancreatic ductal dilatation up to 0.8 cm. Mass appears to closely abut the lateral  surface of the portal vein near the confluence. The superior mesenteric artery is well separated by a fat plane, as is the hepatic artery. 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 The exact borders and size of the mass are better delineated by prior MR. 2. No evidence of metastatic disease in the chest, abdomen, or pelvis. 3. There are numerous lobulated, fluid attenuating cysts throughout the hepatic parenchyma, numerous additional subcentimeter lesions are too small to characterize although demonstrate no evidence of contrast enhancement and are likely additional subcentimeter cysts. Attention on follow-up. 4. Status post common bile duct stent with post stenting pneumobilia. 5. Thickening and fat stranding about the gallbladder as well as about the adjacent duodenal bulb and descending duodenum, of uncertain etiology, concerning for cholecystitis, enteritis, and/or pancreatitis, possibly related to recent endoscopic procedure. Aortic Atherosclerosis (ICD10-I70.0).   09/26/2020 Cancer Staging   Staging form: Exocrine Pancreas, AJCC 8th Edition - Clinical stage from 09/26/2020: Stage IIA (cT3, cN0, cM0) - Signed by [name], MD on 09/29/2020 Stage prefix: Initial diagnosis Total positive nodes: 0    09/26/2020 Procedure   EUS by Dr Benson Norway  IMPRESSION - A mass was identified in the pancreatic head. Cytology results are pending. However, the endosonographic appearance is consistent with adenocarcinoma. Fine needle aspiration performed.   09/26/2020 Initial Biopsy   A. PANCREAS, HEAD, FINE NEEDLE ASPIRATION:  - Malignant cells consistent with  adenocarcinoma    09/29/2020 Initial Diagnosis   Pancreatic cancer ([place])   10/06/2020 Genetic Testing   Negative genetic testing. No pathogenic variants identified on the [facility] CancerNext-Expanded+RNA panel. The report date is 10/16/2020.  The CancerNext-Expanded + RNAinsight gene panel offered by [employer] and includes sequencing  and rearrangement analysis for the following 77 genes: IP, ALK, APC*, ATM*, AXIN2, BAP1, BARD1, BLM, BMPR1A, BRCA1*, BRCA2*, BRIP1*, CDC73, CDH1*,CDK4, CDKN1B, CDKN2A, CHEK2*, CTNNA1, DICER1, FANCC, FH, FLCN, GALNT12, KIF1B, LZTR1, MAX, MEN1, MET, MLH1*, MSH2*, MSH3, MSH6*, MUTYH*, NBN, NF1*, NF2, NTHL1, PALB2*, PHOX2B, PMS2*, POT1, PRKAR1A, PTCH1, PTEN*, RAD51C*, RAD51D*,RB1, RECQL, RET, SDHA, SDHAF2, SDHB, SDHC, SDHD, SMAD4, SMARCA4, SMARCB1, SMARCE1, STK11, SUFU, TMEM127, TP53*,TSC1, TSC2, VHL and XRCC2 (sequencing and deletion/duplication); EGFR, EGLN1, HOXB13, KIT, MITF, PDGFRA, POLD1 and POLE (sequencing only); EPCAM and GREM1 (deletion/duplication only).   10/07/2020 -  Chemotherapy   First-line Gemcitabine and Abraxane 2 weeks on/1 week off starting 10/07/20.  C1 given with Gemcitabine alone.  Abraxane added with cycle 2 and changed to every 2 weeks   01/06/2021 Imaging   CT CAP at [facility] Impression:  1.  Similar pancreatic head mass with unchanged associated vascular involvement and pancreatic ductal dilatation.  2.  Stable multifocal hypodense liver lesions, some of which are indeterminate. Consider MR liver with and without IV contrast for further evaluation.  3.  No evidence of new or increasing metastatic disease in the chest, abdomen or pelvis.    05/09/2021 Imaging   EXAM: CT CHEST, ABDOMEN, AND PELVIS WITH CONTRAST  IMPRESSION: Stable disease with stable heterogeneous mass within the pancreatic head. 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 There is, however, increasing soft tissue infiltration surrounding the superior mesenteric artery suspicious for regional infiltration of disease. No evidence of regional nodal or distant metastatic disease within the chest, abdomen, and pelvis.   Pallidum biliary stenting of the extrahepatic bile duct with secondary pneumobilia again noted.   Multiple simple and minimally complex hepatic cysts identified. No focal intrahepatic  masses noted.   Distal colonic diverticulosis without superimposed acute inflammatory change.   Aortic Atherosclerosis (ICD10-I70.0).      INTERVAL HISTORY:  Tina Patel is here for a follow up of pancreatic cancer. She was last seen by me on 05/13/21. She presents to the clinic accompanied by her husband. She reports she is doing well overall but notes a stomach ache recently.    All other systems were reviewed with the patient and are negative.  MEDICAL HISTORY:  Past Medical History:  Diagnosis Date   Diabetes ([place])    Diabetes mellitus    Family history of colon cancer    Family history of lung cancer    Family history of multiple myeloma    Family history of prostate cancer    High cholesterol    Hypertension    Hypothyroid     SURGICAL HISTORY: Past Surgical History:  Procedure Laterality Date   BILIARY BRUSHING  09/19/2020   Procedure: BILIARY BRUSHING;  Surgeon: [name], MD;  Location: [facility]COPY;  Service: Endoscopy;;   BILIARY STENT PLACEMENT N/A 09/19/2020   Procedure: BILIARY STENT PLACEMENT;  Surgeon: [name], MD;  Location: [facility];  Service: Endoscopy;  Laterality: N/A;   ERCP N/A 09/19/2020   Procedure: ENDOSCOPIC RETROGRADE CHOLANGIOPANCREATOGRAPHY (ERCP);  Surgeon: [name], MD;  Location: [facility];  Service: Endoscopy;  Laterality: N/A;   ESOPHAGOGASTRODUODENOSCOPY (EGD) WITH PROPOFOL N/A 09/19/2020   Procedure: ESOPHAGOGASTRODUODENOSCOPY (EGD) WITH PROPOFOL;  Surgeon:  [name], MD;  Location: [facility];  Service: Endoscopy;  Laterality: N/A;   ESOPHAGOGASTRODUODENOSCOPY (EGD) WITH PROPOFOL N/A 09/26/2020   Procedure: ESOPHAGOGASTRODUODENOSCOPY (EGD) WITH PROPOFOL;  Surgeon: [name], MD;  Location: [facility];  Service: Endoscopy;  Laterality: N/A;   EUS N/A 09/19/2020   Procedure: UPPER ENDOSCOPIC ULTRASOUND (EUS) LINEAR;  Surgeon: [name], MD;  Location: [facility];  Service: Endoscopy;  Laterality: N/A;    FINE NEEDLE ASPIRATION  09/19/2020   Procedure: FINE NEEDLE ASPIRATION;  Surgeon: [name], MD;  Location: [facility];  Service: Endoscopy;;   FINE NEEDLE ASPIRATION N/A 09/26/2020   Procedure: FINE NEEDLE ASPIRATION (FNA) LINEAR;  Surgeon: [name], MD;  Location: [facility];  Service: Endoscopy;  Laterality: N/A;   FINGER SURGERY     IR IMAGING GUIDED PORT INSERTION  11/24/2020   SPHINCTEROTOMY  09/19/2020   Procedure: SPHINCTEROTOMY;  Surgeon: [name], MD;  Location: [facility];  Service: Endoscopy;;   UPPER ESOPHAGEAL ENDOSCOPIC ULTRASOUND (EUS) N/A 09/26/2020   Procedure: UPPER ESOPHAGEAL ENDOSCOPIC ULTRASOUND (EUS);  Surgeon: [name], MD;  Location: [facility];  Service: Endoscopy;  Laterality: N/A;    I have reviewed the social history and family history with the patient and they are unchanged from previous note.  ALLERGIES:  has No Known Allergies.  MEDICATIONS:  Current Outpatient Medications  Medication Sig Dispense Refill   amLODipine (NORVASC) 5 MG tablet Take 5 mg by mouth daily.     blood glucose meter kit and supplies KIT Dispense based on patient and insurance preference. Use up to four times daily as directed. 1 each 0   furosemide (LASIX) 20 MG tablet Take 20 mg by mouth daily.     insulin glargine (LANTUS) 100 UNIT/ML Solostar Pen Inject 8 Units into the skin daily. 15 mL 0   levothyroxine (SYNTHROID) 75 MCG tablet Take 75 mcg by mouth daily before breakfast.     lidocaine-prilocaine (EMLA) cream Apply 1 application topically as needed. (Patient not taking: Reported on 06/02/2021) 30 g 2   lisinopril (ZESTRIL) 10 MG tablet Take 10 mg by mouth daily.     mirtazapine (REMERON) 7.5 MG tablet TAKE 1 TABLET BY MOUTH EVERYDAY AT BEDTIME 90 tablet 1   ondansetron (ZOFRAN) 8 MG tablet Take 1 tablet (8 mg total) by mouth 2 (two) times daily as needed (Nausea or vomiting). (Patient not taking: Reported on 06/02/2021) 30 tablet 1   pantoprazole (PROTONIX) 20 MG  tablet Take 1 tablet (20 mg total) by mouth daily. 30 tablet 1   prochlorperazine (COMPAZINE) 10 MG tablet Take 1 tablet (10 mg total) by mouth every 6 (six) hours as needed (Nausea or vomiting). 30 tablet 1   traMADol (ULTRAM) 50 MG tablet Take 1 tablet (50 mg total) by mouth every 12 (twelve) hours as needed. (Patient not taking: Reported on 06/02/2021) 30 tablet 0   Vitamin D, Ergocalciferol, 50 MCG (2000 UT) CAPS Take 2,000 Units by mouth daily.     No current facility-administered medications for this visit.    PHYSICAL EXAMINATION: ECOG PERFORMANCE STATUS: 2 - Symptomatic, <50% confined to bed  Vitals:   06/10/21 1114  BP: (!) 141/74  Pulse: 85  Resp: 18  Temp: 97.9 F (36.6 C)  SpO2: 100%   Wt Readings from Last 3 Encounters:  06/10/21 136 lb 8 oz (61.9 kg)  06/02/21 134 lb 8 oz (61 kg)  05/13/21 136 lb 12.8 oz (62.1 kg)     GENERAL:alert, no distress and comfortable SKIN:  skin color normal, no rashes or significant lesions EYES: normal, Conjunctiva are pink and non-injected, sclera clear  NEURO: alert & oriented x 3 with fluent speech  LABORATORY DATA:  I have reviewed the data as listed CBC Latest Ref Rng & Units 06/10/2021 05/13/2021 04/29/2021  WBC 4.0 - 10.5 K/uL 4.0 4.3 4.0  Hemoglobin 12.0 - 15.0 g/dL 10.6(L) 10.4(L) 10.0(L)  Hematocrit 36.0 - 46.0 % 32.7(L) 32.2(L) 30.2(L)  Platelets 150 - 400 K/uL 271 201 232     CMP Latest Ref Rng & Units 06/10/2021 05/13/2021 04/29/2021  Glucose 70 - 99 mg/dL 223(H) 192(H) 130(H)  BUN 8 - 23 mg/dL _0 Creatinine 0.44 - 1.00 mg/dL 0.90 0.89 0.80  Sodium 135 - 145 mmol/L 138 137 139  Potassium 3.5 - 5.1 mmol/L 4.3 4.5 4.2  Chloride 98 - 111 mmol/L 105 104 106  CO2 22 - 32 mmol/L _1 Calcium 8.9 - 10.3 mg/dL 8.9 9.1 9.1  Total Protein 6.5 - 8.1 g/dL 6.6 7.2 6.9  Total Bilirubin 0.3 - 1.2 mg/dL 0.4 0.3 0.3  Alkaline Phos 38 - 126 U/L 66 78 74  AST 15 - 41 U/L _2 ALT 0 - 44 U/L _3 RADIOGRAPHIC STUDIES: I have personally reviewed the radiological images as listed and agreed with the findings in the report. No results found.    No orders of the defined types were placed in this encounter.  All questions were answered. The patient knows to call the clinic with any problems, questions or concerns. No barriers to learning was detected. The total time spent in the appointment was 30 minutes.     [name], MD 06/10/2021   I, Wilburn Mylar, am acting as scribe for [name], MD.   I have reviewed the above documentation for accuracy and completeness, and I agree with the above.

## 2021-06-10 NOTE — Patient Instructions (Signed)
[facility] MEDICAL [facility]COL[facility]  Discharge Instructions: Thank you for choosing [facility] to provide your oncology and hematology care.   If you have a lab appointment with the Cancer Center, please go directly to the Cancer Center and check in at the registration area.   Wear comfortable clothing and clothing appropriate for easy access to any Portacath or PICC line.   We strive to give you quality time with your provider. You may need to reschedule your appointment if you arrive late (15 or more minutes).  Arriving late affects you and other patients whose appointments are after yours.  Also, if you miss three or more appointments without notifying the office, you may be dismissed from the clinic at the provider's discretion.      For prescription refill requests, have your pharmacy contact our office and allow 72 hours for refills to be completed.    Today you received the following chemotherapy and/or immunotherapy agents Gemzar      To help prevent nausea and vomiting after your treatment, we encourage you to take your nausea medication as directed.  BELOW ARE SYMPTOMS THAT SHOULD BE REPORTED IMMEDIATELY: *FEVER GREATER THAN 100.4 F (38 C) OR HIGHER *CHILLS OR SWEATING *NAUSEA AND VOMITING THAT IS NOT CONTROLLED WITH YOUR NAUSEA MEDICATION *UNUSUAL SHORTNESS OF BREATH *UNUSUAL BRUISING OR BLEEDING *URINARY PROBLEMS (pain or burning when urinating, or frequent urination) *BOWEL PROBLEMS (unusual diarrhea, constipation, pain near the anus) TENDERNESS IN MOUTH AND THROAT WITH OR WITHOUT PRESENCE OF ULCERS (sore throat, sores in mouth, or a toothache) UNUSUAL RASH, SWELLING OR PAIN  UNUSUAL VAGINAL DISCHARGE OR ITCHING   Items with * indicate a potential emergency and should be followed up as soon as possible or go to the Emergency Department if any problems should occur.  Please show the CHEMOTHERAPY ALERT CARD or IMMUNOTHERAPY ALERT CARD at check-in to the  Emergency Department and triage nurse.  Should you have questions after your visit or need to cancel or reschedule your appointment, please contact [facility]COL[facility]  Dept: [phone]  and follow the prompts.  Office hours are 8:00 a.m. to 4:30 p.m. Monday - Friday. Please note that voicemails left after 4:00 p.m. may not be returned until the following business day.  We are closed weekends and major holidays. You have access to a nurse at all times for urgent questions. Please call the main number to the clinic Dept: [phone] and follow the prompts.   For any non-urgent questions, you may also contact your provider using MyChart. We now offer e-Visits for anyone 18 and older to request care online for non-urgent symptoms. For details visit [email or web address].   Also download the MyChart app! Go to the app store, search "MyChart", open the app, select [facility], and log in with your MyChart username and password.  Due to Covid, a mask is required upon entering the hospital/clinic. If you do not have a mask, one will be given to you upon arrival. For doctor visits, patients may have 1 support person aged 18 or older with them. For treatment visits, patients cannot have anyone with them due to current Covid guidelines and our immunocompromised population.   

## 2021-06-11 LAB — CANCER ANTIGEN 19-9: CA 19-9: 42 U/mL — ABNORMAL HIGH (ref 0–35)

## 2021-06-16 ENCOUNTER — Other Ambulatory Visit: Payer: Self-pay | Admitting: Hematology

## 2021-06-16 DIAGNOSIS — C250 Malignant neoplasm of head of pancreas: Secondary | ICD-10-CM

## 2021-06-24 ENCOUNTER — Encounter (HOSPITAL_COMMUNITY): Payer: Self-pay | Admitting: Gastroenterology

## 2021-06-25 NOTE — Progress Notes (Signed)
Attempted to obtain medical history via telephone, unable to reach at this time. I left a voicemail to return pre surgical testing department's phone call.  

## 2021-07-02 ENCOUNTER — Ambulatory Visit (HOSPITAL_COMMUNITY): Payer: Medicare Other | Admitting: Anesthesiology

## 2021-07-02 ENCOUNTER — Encounter (HOSPITAL_COMMUNITY)
Admission: RE | Disposition: A | Discharge status: Home / Self Care | Payer: Self-pay | Source: Home / Self Care | Attending: Gastroenterology

## 2021-07-02 ENCOUNTER — Encounter (HOSPITAL_COMMUNITY): Payer: Self-pay | Admitting: Gastroenterology

## 2021-07-02 ENCOUNTER — Ambulatory Visit (HOSPITAL_BASED_OUTPATIENT_CLINIC_OR_DEPARTMENT_OTHER): Payer: Medicare Other | Admitting: Anesthesiology

## 2021-07-02 ENCOUNTER — Ambulatory Visit (HOSPITAL_COMMUNITY)
Admission: RE | Admit: 2021-07-02 | Discharge: 2021-07-02 | Disposition: A | Discharge status: Home / Self Care | Payer: Medicare Other | Attending: Gastroenterology | Admitting: Gastroenterology

## 2021-07-02 DIAGNOSIS — K315 Obstruction of duodenum: Secondary | ICD-10-CM

## 2021-07-02 DIAGNOSIS — C259 Malignant neoplasm of pancreas, unspecified: Secondary | ICD-10-CM

## 2021-07-02 DIAGNOSIS — Z79899 Other long term (current) drug therapy: Secondary | ICD-10-CM | POA: Diagnosis not present

## 2021-07-02 DIAGNOSIS — K449 Diaphragmatic hernia without obstruction or gangrene: Secondary | ICD-10-CM | POA: Insufficient documentation

## 2021-07-02 DIAGNOSIS — C250 Malignant neoplasm of head of pancreas: Secondary | ICD-10-CM | POA: Diagnosis present

## 2021-07-02 DIAGNOSIS — I10 Essential (primary) hypertension: Secondary | ICD-10-CM | POA: Insufficient documentation

## 2021-07-02 DIAGNOSIS — E119 Type 2 diabetes mellitus without complications: Secondary | ICD-10-CM

## 2021-07-02 DIAGNOSIS — Z794 Long term (current) use of insulin: Secondary | ICD-10-CM | POA: Insufficient documentation

## 2021-07-02 DIAGNOSIS — K2950 Unspecified chronic gastritis without bleeding: Secondary | ICD-10-CM | POA: Diagnosis not present

## 2021-07-02 DIAGNOSIS — K219 Gastro-esophageal reflux disease without esophagitis: Secondary | ICD-10-CM | POA: Insufficient documentation

## 2021-07-02 HISTORY — PX: ESOPHAGOGASTRODUODENOSCOPY (EGD) WITH PROPOFOL: SHX5813

## 2021-07-02 HISTORY — PX: FIDUCIAL MARKER PLACEMENT: SHX6858

## 2021-07-02 HISTORY — PX: EUS: SHX5427

## 2021-07-02 HISTORY — PX: BIOPSY: SHX5522

## 2021-07-02 LAB — GLUCOSE, CAPILLARY
Glucose-Capillary: 97 mg/dL (ref 70–99)
Glucose-Capillary: 76 mg/dL (ref 70–99)

## 2021-07-02 SURGERY — UPPER ENDOSCOPIC ULTRASOUND (EUS) RADIAL
Anesthesia: Monitor Anesthesia Care

## 2021-07-02 MED ORDER — PROPOFOL 10 MG/ML IV BOLUS
INTRAVENOUS | Status: DC | PRN
  Administered 2021-07-02: 40 mg via INTRAVENOUS

## 2021-07-02 MED ORDER — PROPOFOL 500 MG/50ML IV EMUL
INTRAVENOUS | Status: DC | PRN
  Administered 2021-07-02: 100 ug/kg/min via INTRAVENOUS

## 2021-07-02 MED ORDER — LIDOCAINE 2% (20 MG/ML) 5 ML SYRINGE
INTRAMUSCULAR | Status: DC | PRN
  Administered 2021-07-02: 80 mg via INTRAVENOUS

## 2021-07-02 MED ORDER — LACTATED RINGERS IV SOLN: INTRAVENOUS | Status: DC | PRN

## 2021-07-02 MED ORDER — PHENYLEPHRINE HCL-NACL 20-0.9 MG/250ML-% IV SOLN
INTRAVENOUS | Status: DC | PRN
  Administered 2021-07-02: 80 ug via INTRAVENOUS

## 2021-07-02 SURGICAL SUPPLY — 1 items: fiducial marker ×4 IMPLANT

## 2021-07-02 NOTE — Anesthesia Preprocedure Evaluation (Addendum)
Anesthesia Evaluation  ?Patient identified by MRN, date of birth, ID band ?Patient awake ? ? ? ?Reviewed: ?Allergy & Precautions, NPO status , Patient's Chart, lab work & pertinent test results ? ?History of Anesthesia Complications ?Negative for: history of anesthetic complications ? ?Airway ?Mallampati: II ? ?TM Distance: >3 FB ?Neck ROM: Full ? ? ? Dental ? ?(+) Partial Upper, Dental Advisory Given ?  ?Pulmonary ?neg pulmonary ROS,  ?  ?breath sounds clear to auscultation ? ? ? ? ? ? Cardiovascular ?hypertension, Pt. on medications ?(-) angina ?Rhythm:Regular Rate:Normal ? ? ?  ?Neuro/Psych ?negative neurological ROS ?   ? GI/Hepatic ?Neg liver ROS, GERD  Medicated and Controlled,Pancreatic cancer ?  ?Endo/Other  ?diabetes (glu 97), Insulin Dependent ? Renal/GU ?negative Renal ROS  ? ?  ?Musculoskeletal ? ? Abdominal ?  ?Peds ? Hematology ?  ?Anesthesia Other Findings ? ? Reproductive/Obstetrics ? ?  ? ? ? ? ? ? ? ? ? ? ? ? ? ?  ?  ? ? ? ? ? ? ? ?Anesthesia Physical ?Anesthesia Plan ? ?ASA: 3 ? ?Anesthesia Plan: MAC  ? ?Post-op Pain Management: Minimal or no pain anticipated  ? ?Induction:  ? ?PONV Risk Score and Plan: 2 and Ondansetron and Treatment may vary due to age or medical condition ? ?Airway Management Planned: Natural Airway and Nasal Cannula ? ?Additional Equipment: None ? ?Intra-op Plan:  ? ?Post-operative Plan:  ? ?Informed Consent: I have reviewed the patients History and Physical, chart, labs and discussed the procedure including the risks, benefits and alternatives for the proposed anesthesia with the patient or authorized representative who has indicated his/her understanding and acceptance.  ? ? ? ?Dental advisory given ? ?Plan Discussed with: CRNA and Surgeon ? ?Anesthesia Plan Comments:   ? ? ? ? ? ?Anesthesia Quick Evaluation ? ?

## 2021-07-02 NOTE — Transfer of Care (Signed)
Immediate Anesthesia Transfer of Care Note ? ?Patient: Tina Patel ? ?Procedure(s) Performed: UPPER ENDOSCOPIC ULTRASOUND (EUS) RADIAL ?FIDUCIAL MARKER PLACEMENT ?BIOPSY ? ?Patient Location: PACU ? ?Anesthesia Type:General ? ?Level of Consciousness: awake ? ?Airway & Oxygen Therapy: Patient Spontanous Breathing ? ?Post-op Assessment: Report given to RN and Post -op Vital signs reviewed and stable ? ?Post vital signs: Reviewed and stable ? ?Last Vitals:  ?Vitals Value Taken Time  ?BP 135/72 07/02/21 1135  ?Temp 36.5 ?C 07/02/21 1120  ?Pulse 67 07/02/21 1137  ?Resp 16 07/02/21 1137  ?SpO2 98 % 07/02/21 1137  ?Vitals shown include unvalidated device data. ? ?Last Pain:  ?Vitals:  ? 07/02/21 1120  ?TempSrc:   ?PainSc: 0-No pain  ?   ? ?  ? ?Complications: No notable events documented. ?

## 2021-07-02 NOTE — H&P (Signed)
? ?[facility] PROCEDURE H&P NOTE  ? ?Primary Care Physician: ?Tina Morning, DO ? ?HPI: ?Tina Patel is a 81 y.o. 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 ? ?Past Medical History:  ?Diagnosis Date  ? Diabetes ([place])   ? Diabetes mellitus   ? Family history of colon cancer   ? Family history of lung cancer   ? Family history of multiple myeloma   ? Family history of prostate cancer   ? High cholesterol   ? Hypertension   ? Hypothyroid   ? ?Past Surgical History:  ?Procedure Laterality Date  ? BILIARY BRUSHING  09/19/2020  ? Procedure: BILIARY BRUSHING;  Surgeon: [name], MD;  Location: [facility];  Service: Endoscopy;;  ? BILIARY STENT PLACEMENT N/A 09/19/2020  ? Procedure: BILIARY STENT PLACEMENT;  Surgeon: [name], MD;  Location: [facility];  Service: Endoscopy;  Laterality: N/A;  ? ERCP N/A 09/19/2020  ? Procedure: ENDOSCOPIC RETROGRADE CHOLANGIOPANCREATOGRAPHY (ERCP);  Surgeon: [name], MD;  Location: [facility];  Service: Endoscopy;  Laterality: N/A;  ? ESOPHAGOGASTRODUODENOSCOPY (EGD) WITH PROPOFOL N/A 09/19/2020  ? Procedure: ESOPHAGOGASTRODUODENOSCOPY (EGD) WITH PROPOFOL;  Surgeon: [name], MD;  Location: [facility];  Service: Endoscopy;  Laterality: N/A;  ? ESOPHAGOGASTRODUODENOSCOPY (EGD) WITH PROPOFOL N/A 09/26/2020  ? Procedure: ESOPHAGOGASTRODUODENOSCOPY (EGD) WITH PROPOFOL;  Surgeon: [name], MD;  Location: [facility];  Service: Endoscopy;  Laterality: N/A;  ? EUS N/A 09/19/2020  ? Procedure: UPPER ENDOSCOPIC ULTRASOUND (EUS) LINEAR;  Surgeon: [name], MD;  Location: [facility];  Service: Endoscopy;  Laterality: N/A;  ? FINE NEEDLE ASPIRATION  09/19/2020  ? Procedure: FINE NEEDLE ASPIRATION;  Surgeon: [name], MD;  Location: [facility];  Service: Endoscopy;;  ? FINE NEEDLE ASPIRATION N/A 09/26/2020  ? Procedure: FINE NEEDLE ASPIRATION (FNA) LINEAR;  Surgeon: [name],  MD;  Location: [facility];  Service: Endoscopy;  Laterality: N/A;  ? FINGER SURGERY    ? IR IMAGING GUIDED PORT INSERTION  11/24/2020  ? SPHINCTEROTOMY  09/19/2020  ? Procedure: SPHINCTEROTOMY;  Surgeon: [name], MD;  Location: [facility];  Service: Endoscopy;;  ? UPPER ESOPHAGEAL ENDOSCOPIC ULTRASOUND (EUS) N/A 09/26/2020  ? Procedure: UPPER ESOPHAGEAL ENDOSCOPIC ULTRASOUND (EUS);  Surgeon: [name], MD;  Location: [facility];  Service: Endoscopy;  Laterality: N/A;  ? ?No current facility-administered medications for this encounter.  ? ?No current facility-administered medications for this encounter. ?No Known Allergies ?Family History  ?Problem Relation Age of Onset  ? Cancer Mother   ?     lung cancer  ? Prostate cancer Father   ? Multiple myeloma Sister   ? Prostate cancer Brother   ? Cancer - Colon Maternal Aunt   ? Lung cancer Maternal Uncle   ? Cancer Son 64  ?     prostate cancer  ? ?Social History  ? ?Socioeconomic History  ? Marital status: Married  ?  Spouse name: Not on file  ? Number of children: 3  ? Years of education: Not on file  ? Highest education level: Not on file  ?Occupational History  ? Not on file  ?Tobacco Use  ? Smoking status: Never  ? Smokeless tobacco: Never  ?Substance and Sexual Activity  ? Alcohol use: No  ? Drug use: No  ? Sexual activity: Not on file  ?Other Topics Concern  ? Not on file  ?Social History Narrative  ? Not on file  ? ?Social Determinants of Health  ? ?  Financial Resource Strain: Not on file  ?Food Insecurity: Not on file  ?Transportation Needs: Not on file  ?Physical Activity: Not on file  ?Stress: Not on file  ?Social Connections: Not on file  ?Intimate Partner Violence: Not on file  ? ? ?Physical Exam: ?Today's Vitals  ? 07/02/21 0951  ?BP: (!) 141/50  ?Pulse: 73  ?Resp: 12  ?Temp: 97.9 ?F (36.6 ?C)  ?TempSrc: Oral  ?SpO2: 98%  ?Weight: 59 kg  ?Height: 5' 6" (1.676 m)  ? ?Body mass index is 20.98 kg/m?. ?GEN: NAD ?EYE: Sclerae anicteric ?ENT: MMM ?CV:  Non-tachycardic ?GI: Soft, NT/ND ?NEURO:  Alert & Oriented x 3 ? ?Lab Results: ?No results for input(s): WBC, HGB, HCT, PLT in the last 72 hours. ?BMET ?No results for input(s): NA, K, CL, CO2, GLUCOSE, BUN, CREATININE, CALCIUM in the last 72 hours. ?LFT ?No results for input(s): PROT, ALBUMIN, AST, ALT, ALKPHOS, BILITOT, BILIDIR, IBILI in the last 72 hours. ?PT/INR ?No results for input(s): LABPROT, INR in the last 72 hours. ? ? 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 ? ?The risks and benefits of endoscopic evaluation/treatment were discussed with the patient and/or family; these include but are not limited to the risk of perforation, infection, bleeding, missed lesions, lack of diagnosis, severe illness requiring hospitalization, as well as anesthesia and sedation related illnesses.  The patient's history has been reviewed, patient examined, no change in status, and deemed stable for procedure.  The patient and/or family is agreeable to proceed.  ? ? ?Justice Britain, MD ?[facility] ?[facility] ?Office # [phone] ? ?

## 2021-07-02 NOTE — Op Note (Signed)
[facility] ?Patient Name: Tina Patel ?Procedure Date : 07/02/2021 ?MRN: [id number] ?Attending MD:  Mansouraty , MD ?Date of Birth: 07/02/1940 ?CSN: [id number] ?Age: 80 ?Admit Type: Outpatient ?Procedure:                Upper EUS ?Indications:              Pancreatic adenocarcinoma, Fiducial ?Providers:                 Mansouraty, MD, Hayleigh Westmoreland, RN,  ?                          Haiven Houle, Technician ?Referring MD:             Patrick Hung, MD, John S. Moody, Yan Feng, PA  ?                          [place] ?Medicines:                Monitored Anesthesia Care ?Complications:            No immediate complications. ?Estimated Blood Loss:     Estimated blood loss was minimal. ?Procedure:                Pre-Anesthesia Assessment: ?                          - Prior to the procedure, a History and Physical  ?                          was performed, and patient medications and  ?                          allergies were reviewed. The patient's tolerance of  ?                          previous anesthesia was also reviewed. The risks  ?                          and benefits of the procedure and the sedation  ?                          options and risks were discussed with the patient.  ?                          All questions were answered, and informed consent  ?                          was obtained. Prior Anticoagulants: The patient has  ?                          taken no previous anticoagulant or antiplatelet  ?                          agents. ASA Grade Assessment: III - A patient with  ?                            severe systemic disease. After reviewing the risks  ?                          and benefits, the patient was deemed in  ?                          satisfactory condition to undergo the procedure. ?                          After obtaining informed consent, the endoscope was  ?                          passed under direct vision. Throughout the  ?                           procedure, the patient's blood pressure, pulse, and  ?                          oxygen saturations were monitored continuously. The  ?                          [id number]-[id number] ([id number]) Olympus endoscope was introduced  ?                          through the mouth, and advanced to the second part  ?                          of duodenum. The [id number]-[id number] ([id number]) Olympus  ?                          duodenoscope was introduced through the mouth, and  ?                          advanced to the area of papilla. The [id number]  ?                          ([id number]) Olympus linear ultrasound scope was  ?                          introduced through the mouth, and advanced to the  ?                          duodenum for ultrasound examination. The upper EUS  ?                          was accomplished without difficulty. The patient  ?                          tolerated the procedure. ?Scope In: ?Scope Out: ?Findings: ?     ENDOSCOPIC FINDING: : ?     No gross lesions were noted in the entire esophagus. ?     The Z-line was irregular and was found 33 cm from the incisors. ?     A 3 cm hiatal hernia was present. ?  Patchy moderately erythematous mucosa without bleeding was found in the  ?     entire examined stomach. Biopsies were taken with a cold forceps for  ?     histology and Helicobacter pylori testing. ?     An acquired extrinsic moderate stenosis was found in the D1/D2 sweep  ?     extending into D2. This was traversed with the adult endoscope. However,  ?     I could not evaluate the ampulla or previously placed biliary stent with  ?     the duodenoscope or endoscope. ?     ENDOSONOGRAPHIC FINDING: : ?     An irregular mass was identified in the pancreatic head. The mass was  ?     hypoechoic. The mass measured 35 mm by 30 mm in maximal cross-sectional  ?     diameter. The outer margins were irregular. The endosonographic  ?     appearance of the upstream pancreatic duct indicated duct dilation.  ?     Fiducial marker  placement was performed once the target lesion in the  ?     head of the pancreas was identified. A preloaded [employer] marker in a  ?     22 gauge needle was then deployed in and around the lesion. This was  ?     repeated for a total of four markers. ?     One stent was visualized endosonographically in the common bile duct. ?     Moderate hyperechoic material consistent with sludge was visualized  ?     endosonographically in the gallbladder. ?Impression:               EGD Impression: ?                          - No gross lesions in esophagus. Z-line irregular,  ?                          33 cm from the incisors. ?                          - 3 cm hiatal hernia. ?                          - Erythematous mucosa in the stomach. Biopsied. ?                          - Acquired duodenal stenosis. Unable to visualize  ?                          ampullary orifice due to stenosis. Though adult  ?                          endoscope can traverse the region. ?                          EUS Impression: ?                          - A mass was identified in the pancreatic head. A  ?  tissue diagnosis was obtained prior to this exam.  ?                          This is consistent with adenocarcinoma. Fiducial  ?                          markers were deployed. ?                          - One stent was visualized endosonographically in  ?                          the common bile duct. ?                          - Hyperechoic material consistent with sludge was  ?                          visualized endosonographically in the gallbladder. ?Recommendation:           - The patient will be observed post-procedure,  ?                          until all discharge criteria are met. ?                          - Discharge patient to home. ?                          - Observe patient's clinical course. ?                          - Monitor for signs/symptoms of pancreatitis,  ?                          bleeding,  perforation, and infection. If issues  ?                          please call our number to get further assistance as  ?                          needed. ?                          - Await path results. ?                          - Close monitoring in the coming months in regards  ?                          to nutritional intake. There is a chance that  ?                          progressive disease may lead to duodenal stenosis  ?  further. ?                          - The findings and recommendations were discussed  ?                          with the patient. ?                          - The findings and recommendations were discussed  ?                          with the patient's family. ?Procedure Code(s):        --- Professional --- ?                          43253, Esophagogastroduodenoscopy, flexible,  ?                          transoral; with transendoscopic ultrasound-guided  ?                          transmural injection of diagnostic or therapeutic  ?                          substance(s) (eg, anesthetic, neurolytic agent) or  ?                          fiducial marker(s) (includes endoscopic ultrasound  ?                          examination of the esophagus, stomach, and either  ?                          the duodenum or a surgically altered stomach where  ?                          the jejunum is examined distal to the anastomosis) ?Diagnosis Code(s):        --- Professional --- ?                          K22.8, Other specified diseases of esophagus ?                          K44.9, Diaphragmatic hernia without obstruction or  ?                          gangrene ?                          K31.89, Other diseases of stomach and duodenum ?                          K31.5, Obstruction of duodenum ?                          K86.89, Other specified diseases of   pancreas ?                          C25.9, Malignant neoplasm of pancreas, unspecified ?CPT copyright 2019 American Medical  Association. All rights reserved. ?The codes documented in this report are preliminary and upon coder review may  ?be revised to meet current compliance requirements. ? Mansouraty, MD ?07/02/2021 11:24:0

## 2021-07-03 ENCOUNTER — Encounter (HOSPITAL_COMMUNITY): Payer: Self-pay | Admitting: Gastroenterology

## 2021-07-06 ENCOUNTER — Ambulatory Visit
Admission: RE | Admit: 2021-07-06 | Discharge: 2021-07-06 | Disposition: A | Discharge status: Home / Self Care | Payer: Medicare Other | Source: Ambulatory Visit | Attending: Radiation Oncology | Admitting: Radiation Oncology

## 2021-07-06 ENCOUNTER — Ambulatory Visit: Admission: RE | Admit: 2021-07-06 | Payer: Medicare Other | Source: Ambulatory Visit | Admitting: Radiation Oncology

## 2021-07-06 ENCOUNTER — Other Ambulatory Visit: Payer: Self-pay

## 2021-07-06 VITALS — BP 126/54 | HR 105 | Temp 96.2°F | Resp 18 | Ht 66.0 in

## 2021-07-06 DIAGNOSIS — C250 Malignant neoplasm of head of pancreas: Secondary | ICD-10-CM | POA: Diagnosis present

## 2021-07-06 LAB — SURGICAL PATHOLOGY

## 2021-07-06 MED ORDER — HEPARIN SOD (PORK) LOCK FLUSH 100 UNIT/ML IV SOLN
500.0000 [IU] | Freq: Once | INTRAVENOUS | Status: AC
  Administered 2021-07-06: 500 [IU] via INTRAVENOUS

## 2021-07-06 MED ORDER — SODIUM CHLORIDE 0.9% FLUSH
10.0000 mL | Freq: Once | INTRAVENOUS | Status: AC
  Administered 2021-07-06: 10 mL via INTRAVENOUS

## 2021-07-06 NOTE — Progress Notes (Signed)
Has armband been applied?  Yes.   ? ?Does patient have an allergy to IV contrast dye?: No ?  ?Has patient ever received premedication for IV contrast dye?: n/a  ? ?Does patient take metformin?: No ? ?If patient does take metformin when was the last dose: n/a ? ?Date of lab work: 06/10/2021 ?BUN: 20  ?CR: 0.90 ?eGfr: >60 ? ?IV site: Chest Port ? ?Has IV site been added to flowsheet?  Yes.   ? ? ?Vitals:  ? 07/06/21 1028  ?BP: (!) 126/54  ?Pulse: (!) 105  ?Resp: 18  ?Temp: (!) 96.2 ?F (35.7 ?C)  ?TempSrc: Temporal  ?SpO2: 100%  ?Height: 5' 6"  (1.676 m)  ?  ?

## 2021-07-07 ENCOUNTER — Encounter: Payer: Self-pay | Admitting: Gastroenterology

## 2021-07-08 NOTE — Anesthesia Postprocedure Evaluation (Signed)
Anesthesia Post Note ? ?Patient: Tina Patel ? ?Procedure(s) Performed: UPPER ENDOSCOPIC ULTRASOUND (EUS) RADIAL ?FIDUCIAL MARKER PLACEMENT ?BIOPSY ?ESOPHAGOGASTRODUODENOSCOPY (EGD) WITH PROPOFOL ? ?  ? ?Patient location during evaluation: Endoscopy ?Anesthesia Type: MAC ?Level of consciousness: awake and alert ?Pain management: pain level controlled ?Vital Signs Assessment: post-procedure vital signs reviewed and stable ?Respiratory status: spontaneous breathing, nonlabored ventilation, respiratory function stable and patient connected to nasal cannula oxygen ?Cardiovascular status: stable and blood pressure returned to baseline ?Postop Assessment: no apparent nausea or vomiting ?Anesthetic complications: no ? ? ?No notable events documented. ? ?Last Vitals:  ?Vitals:  ? 07/02/21 1135 07/02/21 1150  ?BP: 135/72 132/74  ?Pulse: 67 69  ?Resp: 17 19  ?Temp:  (!) 36.3 ?C  ?SpO2: 100% 100%  ?  ?Last Pain:  ?Vitals:  ? 07/02/21 1150  ?TempSrc:   ?PainSc: 0-No pain  ? ? ?  ?  ?  ?  ?  ?  ? ?Alfhild Partch ? ? ? ? ?

## 2021-07-14 DIAGNOSIS — C250 Malignant neoplasm of head of pancreas: Secondary | ICD-10-CM | POA: Diagnosis not present

## 2021-07-15 ENCOUNTER — Other Ambulatory Visit: Payer: Self-pay

## 2021-07-15 ENCOUNTER — Ambulatory Visit: Admission: RE | Admit: 2021-07-15 | Payer: Medicare Other | Source: Ambulatory Visit | Admitting: Radiation Oncology

## 2021-07-16 ENCOUNTER — Other Ambulatory Visit: Payer: Self-pay | Admitting: Hematology

## 2021-07-16 ENCOUNTER — Ambulatory Visit
Admission: RE | Admit: 2021-07-16 | Discharge: 2021-07-16 | Disposition: A | Discharge status: Home / Self Care | Payer: Medicare Other | Source: Ambulatory Visit | Attending: Radiation Oncology | Admitting: Radiation Oncology

## 2021-07-16 ENCOUNTER — Ambulatory Visit: Payer: Medicare Other | Admitting: Radiation Oncology

## 2021-07-17 ENCOUNTER — Ambulatory Visit: Payer: Medicare Other | Admitting: Radiation Oncology

## 2021-07-20 ENCOUNTER — Ambulatory Visit: Payer: Medicare Other | Admitting: Radiation Oncology

## 2021-07-21 ENCOUNTER — Ambulatory Visit: Payer: Medicare Other | Admitting: Radiation Oncology

## 2021-07-22 ENCOUNTER — Other Ambulatory Visit: Payer: Self-pay

## 2021-07-22 ENCOUNTER — Ambulatory Visit
Admission: RE | Admit: 2021-07-22 | Discharge: 2021-07-22 | Disposition: A | Discharge status: Home / Self Care | Payer: Medicare Other | Source: Ambulatory Visit | Attending: Radiation Oncology | Admitting: Radiation Oncology

## 2021-07-22 DIAGNOSIS — C250 Malignant neoplasm of head of pancreas: Secondary | ICD-10-CM | POA: Insufficient documentation

## 2021-07-24 ENCOUNTER — Ambulatory Visit
Admission: RE | Admit: 2021-07-24 | Discharge: 2021-07-24 | Disposition: A | Discharge status: Home / Self Care | Payer: Medicare Other | Source: Ambulatory Visit | Attending: Radiation Oncology | Admitting: Radiation Oncology

## 2021-07-24 DIAGNOSIS — C250 Malignant neoplasm of head of pancreas: Secondary | ICD-10-CM | POA: Diagnosis not present

## 2021-07-26 ENCOUNTER — Other Ambulatory Visit: Payer: Self-pay | Admitting: Hematology

## 2021-07-27 ENCOUNTER — Ambulatory Visit
Admission: RE | Admit: 2021-07-27 | Discharge: 2021-07-27 | Disposition: A | Discharge status: Home / Self Care | Payer: Medicare Other | Source: Ambulatory Visit | Attending: Radiation Oncology | Admitting: Radiation Oncology

## 2021-07-27 ENCOUNTER — Other Ambulatory Visit: Payer: Self-pay

## 2021-07-27 ENCOUNTER — Encounter: Payer: Self-pay | Admitting: Hematology

## 2021-07-27 DIAGNOSIS — C250 Malignant neoplasm of head of pancreas: Secondary | ICD-10-CM | POA: Diagnosis not present

## 2021-07-29 ENCOUNTER — Other Ambulatory Visit: Payer: Self-pay

## 2021-07-29 ENCOUNTER — Ambulatory Visit
Admission: RE | Admit: 2021-07-29 | Discharge: 2021-07-29 | Disposition: A | Discharge status: Home / Self Care | Payer: Medicare Other | Source: Ambulatory Visit | Attending: Radiation Oncology | Admitting: Radiation Oncology

## 2021-07-29 DIAGNOSIS — C250 Malignant neoplasm of head of pancreas: Secondary | ICD-10-CM | POA: Diagnosis not present

## 2021-07-31 ENCOUNTER — Other Ambulatory Visit: Payer: Self-pay

## 2021-07-31 ENCOUNTER — Encounter: Payer: Self-pay | Admitting: Radiation Oncology

## 2021-07-31 ENCOUNTER — Ambulatory Visit
Admission: RE | Admit: 2021-07-31 | Discharge: 2021-07-31 | Disposition: A | Discharge status: Home / Self Care | Payer: Medicare Other | Source: Ambulatory Visit | Attending: Radiation Oncology | Admitting: Radiation Oncology

## 2021-07-31 DIAGNOSIS — C250 Malignant neoplasm of head of pancreas: Secondary | ICD-10-CM | POA: Diagnosis not present

## 2021-08-03 ENCOUNTER — Other Ambulatory Visit: Payer: Self-pay

## 2021-08-03 ENCOUNTER — Inpatient Hospital Stay: Payer: Medicare Other | Attending: Hematology

## 2021-08-03 ENCOUNTER — Encounter: Payer: Self-pay | Admitting: Hematology

## 2021-08-03 ENCOUNTER — Inpatient Hospital Stay: Payer: Medicare Other | Admitting: Hematology

## 2021-08-03 VITALS — BP 132/65 | HR 72 | Temp 97.9°F | Resp 16 | Ht 66.0 in | Wt 128.5 lb

## 2021-08-03 DIAGNOSIS — C250 Malignant neoplasm of head of pancreas: Secondary | ICD-10-CM

## 2021-08-03 DIAGNOSIS — E1142 Type 2 diabetes mellitus with diabetic polyneuropathy: Secondary | ICD-10-CM | POA: Insufficient documentation

## 2021-08-03 DIAGNOSIS — Z79899 Other long term (current) drug therapy: Secondary | ICD-10-CM | POA: Insufficient documentation

## 2021-08-03 DIAGNOSIS — Z95828 Presence of other vascular implants and grafts: Secondary | ICD-10-CM

## 2021-08-03 DIAGNOSIS — T451X5A Adverse effect of antineoplastic and immunosuppressive drugs, initial encounter: Secondary | ICD-10-CM | POA: Insufficient documentation

## 2021-08-03 LAB — CBC WITH DIFFERENTIAL (CANCER CENTER ONLY)
Abs Immature Granulocytes: 0.01 10*3/uL (ref 0.00–0.07)
Basophils Absolute: 0 10*3/uL (ref 0.0–0.1)
Basophils Relative: 1 %
Eosinophils Absolute: 0.3 10*3/uL (ref 0.0–0.5)
Eosinophils Relative: 8 %
HCT: 32.5 % — ABNORMAL LOW (ref 36.0–46.0)
Hemoglobin: 10.4 g/dL — ABNORMAL LOW (ref 12.0–15.0)
Immature Granulocytes: 0 %
Lymphocytes Relative: 31 %
Lymphs Abs: 1.2 10*3/uL (ref 0.7–4.0)
MCH: 26.8 pg (ref 26.0–34.0)
MCHC: 32 g/dL (ref 30.0–36.0)
MCV: 83.8 fL (ref 80.0–100.0)
Monocytes Absolute: 0.3 10*3/uL (ref 0.1–1.0)
Monocytes Relative: 9 %
Neutro Abs: 2 10*3/uL (ref 1.7–7.7)
Neutrophils Relative %: 51 %
Platelet Count: 237 10*3/uL (ref 150–400)
RBC: 3.88 MIL/uL (ref 3.87–5.11)
RDW: 14.6 % (ref 11.5–15.5)
WBC Count: 3.9 10*3/uL — ABNORMAL LOW (ref 4.0–10.5)
nRBC: 0 % (ref 0.0–0.2)

## 2021-08-03 LAB — CMP (CANCER CENTER ONLY)
ALT: 8 U/L (ref 0–44)
AST: 15 U/L (ref 15–41)
Albumin: 3.6 g/dL (ref 3.5–5.0)
Alkaline Phosphatase: 72 U/L (ref 38–126)
Anion gap: 6 (ref 5–15)
BUN: 13 mg/dL (ref 8–23)
CO2: 28 mmol/L (ref 22–32)
Calcium: 9.1 mg/dL (ref 8.9–10.3)
Chloride: 104 mmol/L (ref 98–111)
Creatinine: 0.75 mg/dL (ref 0.44–1.00)
GFR, Estimated: >60 mL/min (ref >60)
Glucose, Bld: 75 mg/dL (ref 70–99)
Potassium: 4.1 mmol/L (ref 3.5–5.1)
Sodium: 138 mmol/L (ref 135–145)
Total Bilirubin: 0.3 mg/dL (ref 0.3–1.2)
Total Protein: 7 g/dL (ref 6.5–8.1)

## 2021-08-03 MED ORDER — HEPARIN SOD (PORK) LOCK FLUSH 100 UNIT/ML IV SOLN
500.0000 [IU] | Freq: Once | INTRAVENOUS | Status: AC
  Administered 2021-08-03: 500 [IU]

## 2021-08-03 MED ORDER — SODIUM CHLORIDE 0.9% FLUSH
10.0000 mL | Freq: Once | INTRAVENOUS | Status: AC
  Administered 2021-08-03: 10 mL

## 2021-08-03 MED ORDER — GABAPENTIN 100 MG PO CAPS: 100.0000 mg | ORAL_CAPSULE | Freq: Every day | ORAL | 1 refills | Status: DC

## 2021-08-03 NOTE — Progress Notes (Signed)
?[name]   ?[phone] [phone]   ?Clinic Follow up Note  ? ?Patient Care Team: ?[name], DO as PCP - General (Family Medicine) ?[name], RN (Inactive) as Oncology Nurse Navigator ?[name], MD as Consulting Physician (Oncology) ?[name], MD as Consulting Physician (Gastroenterology) ? ?Date of Service:  08/03/2021 ? ?CHIEF COMPLAINT: f/u of pancreatic cancer ? ?CURRENT THERAPY:  ?Surveillance ? ?ASSESSMENT & PLAN:  ?Tina Patel is a 81 y.o. female with  ? ?1. 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 Initial EUS on 09/19/20 showed mass at head of pancreas, but biopsy was negative. She had ERCP stent placement same day. ?-repeat EUS biopsy on 09/26/20 showed adenocarcinoma from pancreatic primary. CT CAP showed mass to be 4.5 cm, no evidence of metastatic disease, benign cystic liver lesions.   ?-She began neoadjuvant gemcitabine on 10/07/20, Abraxane was added with cycle 2 and changed to every 2 weeks. She did not tolerate dose increase. Final dose (C15) on 06/10/21. ?-she previously told me she does not want surgery (which is very reasonable decision); she would prefer to continue chemo.  ?-CT CAP on 05/09/21 showed stable disease, however there is suspicion for regional infiltration; no evidence of nodal or distal metastatic disease. ?-EUS/EGD with fiducial marker placement on 07/02/21 by Dr. Rush Landmark. Pancreatic mass measured 3.5 cm. ?-she received SBRT under Dr. Lisbeth Renshaw 4/5-4/14/23. She tolerated well. ?-labs reviewed, overall stable. She is on surveillance now. We will plan for post-treatment CT in 3 months, and I will order at our next visit. ?  ?2. 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  ?3. Genetic Testing ?-she underwent testing on 10/06/20, results were negative. ?  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 She is now interested in trying gabapentin; I will prescribe for her today. ?  ?  ?PLAN: ?-I called in gabapentin for her to try at night for her neuropathy ?-lab, flush, and f/u in 5-6 weeks ?            -I will order restaging scan at that time ? ? ?No problem-specific Assessment & Plan notes found for this encounter. ? ? ?SUMMARY OF ONCOLOGIC HISTORY: ?Oncology History Overview Note  ?Cancer Staging ?Pancreatic cancer ([place]) ?Staging form: Exocrine Pancreas, AJCC 8th Edition ?- Clinical stage from 09/26/2020: Stage IB (cT2, cN0, cM0) - Signed by [name], MD on 09/29/2020 ?Stage prefix: Initial diagnosis ?Total positive nodes: 0 ? ?  ?Pancreatic cancer [facility])  ?09/18/2020 Imaging  ? US Abdomen  ?IMPRESSION: ?Intrahepatic and extrahepatic biliary ductal dilatation. Common bile ?duct dilated up to 13 mm. This could be further evaluated with MRCP or ERCP. ?  ?Sludge within the gallbladder with associated gallbladder wall ?thickening and pericholecystic fluid. Cannot exclude acute ?cholecystitis. ?  ?Multiple hepatic cysts. Complex cystic area posteriorly in the right ?hepatic lobe. This could also be further evaluated with MRI. ?  ?09/19/2020 Procedure  ? EUS and ERCP by Dr Benson Norway  ? ?EUS IMPRESSION ?- A mass was identified in the pancreatic head. The staging applies if malignancy is confirmed. Fine needle aspiration performed. ?- There was dilation in the common hepatic duct which measured up to 13 mm. ?- There was dilation in the intrahepatic bile ducts, diffusely. ?- A cyst was found in the left lobe of the liver and measured 24 mm by 15 mm. ?- Endosonographic images of  the left adrenal gland were unremarkable. ? ?ERCP IMPRESSION ?- The major papilla appeared normal. ?- A biliary sphincterotomy was performed. ?- Cells for cytology obtained distal CBD. ?- One covered metal stent was placed into the common bile duct. ? ? ?  ?09/19/2020 Pathology Results  ? A. PANCREAS, HEAD, FINE  NEEDLE ASPIRATION:  ?- Benign reactive/reparative changes  ?- Bland spindle cell fragments  ? ?DIAGNOSTIC COMMENTS:  ?There are fragments of bland spindle cells, favored to represent smooth muscle tissue. The glandular fragments are also bland and thus, there is no definitive malignancy identified ? ? ?B. BILIARY, STRICTURE, BRUSHING:  ?- Benign reactive/reparative changes ?  ?09/20/2020 Imaging  ? MRI Abdomen  ?IMPRESSION: ?1. Mass in the head of the pancreas with upstream pancreatic duct ?dilatation and atrophy is highly concerning for pancreatic ?adenocarcinoma. Recommend ERCP/EUS or tissue sampling. ?2. There is mild biliary ductal dilatation of the common hepatic ?bile duct and the intrahepatic bile ducts in the LEFT hepatic lobe. ?Sequences are severely degraded by patient motion. Concern for ?obstruction of the common bile duct by the pancreatic head mass. ?3. Multiple cystic lesions throughout the liver parenchyma. ?Assessment of enhancement is difficult due to patient motion as well as no true noncontrast T1 weighted imaging as described above. Favor complex benign cysts but consider liver protocol contrast CT for further evaluation of the cystic lesions to exclude metastasis. ?  ?09/21/2020 Imaging  ? CT CAP  ?IMPRESSION: ?1. Heterogeneous mass of the central pancreatic head measuring 4.5 x 3.3 cm. There is atrophy of the distal pancreatic parenchyma with diffuse pancreatic ductal dilatation up to 0.8 cm. Mass appears to closely abut the lateral surface of the portal vein near the confluence. The superior mesenteric artery is well separated by a fat plane, as is the hepatic artery. Findings are consistent with ?pancreatic adenocarcinoma. The exact borders and size of the mass are better delineated by prior MR. ?2. No evidence of metastatic disease in the chest, abdomen, or ?pelvis. ?3. There are numerous lobulated, fluid attenuating cysts throughout ?the hepatic parenchyma, numerous additional subcentimeter  lesions are too small to characterize although demonstrate no evidence of contrast enhancement and are likely additional subcentimeter cysts. Attention on follow-up. ?4. Status post common bile duct stent with post stenting ?pneumobilia. ?5. Thickening and fat stranding about the gallbladder as well as ?about the adjacent duodenal bulb and descending duodenum, of ?uncertain etiology, concerning for cholecystitis, enteritis, and/or ?pancreatitis, possibly related to recent endoscopic procedure. ?Aortic Atherosclerosis (ICD10-I70.0). ?  ?09/26/2020 Cancer Staging  ? Staging form: Exocrine Pancreas, AJCC 8th Edition ?- Clinical stage from 09/26/2020: Stage IIA (cT3, cN0, cM0) - Signed by [name], MD on 09/29/2020 ?Stage prefix: Initial diagnosis ?Total positive nodes: 0 ? ?  ?09/26/2020 Procedure  ? EUS by Dr Benson Norway  ?IMPRESSION ?- A mass was identified in the pancreatic head. Cytology results are pending. However, the endosonographic appearance is consistent with adenocarcinoma. Fine needle aspiration performed. ?  ?09/26/2020 Initial Biopsy  ? A. PANCREAS, HEAD, FINE NEEDLE ASPIRATION:  ?- Malignant cells consistent with adenocarcinoma  ?  ?09/29/2020 Initial Diagnosis  ? Pancreatic cancer ([place]) ? ?  ?10/06/2020 Genetic Testing  ? Negative genetic testing. No pathogenic variants identified on the [facility] CancerNext-Expanded+RNA panel. The report date is 10/16/2020. ? ?The CancerNext-Expanded + RNAinsight gene panel offered by [employer] and includes sequencing and rearrangement analysis for the following 77 genes: IP, ALK, APC*, ATM*, AXIN2, BAP1, BARD1, BLM, BMPR1A, BRCA1*, BRCA2*, BRIP1*, CDC73, CDH1*,CDK4, CDKN1B, CDKN2A, CHEK2*,  CTNNA1, DICER1, FANCC, FH, FLCN, GALNT12, KIF1B, LZTR1, MAX, MEN1, MET, MLH1*, MSH2*, MSH3, MSH6*, MUTYH*, NBN, NF1*, NF2, NTHL1, PALB2*, PHOX2B, PMS2*, POT1, PRKAR1A, PTCH1, PTEN*, RAD51C*, RAD51D*,RB1, RECQL, RET, SDHA, SDHAF2, SDHB, SDHC, SDHD, SMAD4, SMARCA4, SMARCB1, SMARCE1, STK11, SUFU,  TMEM127, TP53*,TSC1, TSC2, VHL and XRCC2 (sequencing and deletion/duplication); EGFR, EGLN1, HOXB13, KIT, MITF, PDGFRA, POLD1 and POLE (sequencing only); EPCAM and GREM1 (deletion/duplication only). ?

## 2021-08-04 LAB — CANCER ANTIGEN 19-9: CA 19-9: 59 U/mL — ABNORMAL HIGH (ref 0–35)

## 2021-08-24 ENCOUNTER — Encounter: Payer: Self-pay | Admitting: Hematology

## 2021-08-24 NOTE — Progress Notes (Signed)
? ?                                                                                                                                                          ?  Patient Name: Tina Patel ?MRN: [id number] ?DOB: 1940/05/29 ?Referring Physician: [name] (Profile Not Attached) ?Date of Service: 07/31/2021 ?[facility], [place] ? ?                                                      End Of Treatment Note ? ?Diagnoses: C25.0-Malignant neoplasm of head of pancreas ? ?Cancer Staging: Stage IIA, cT3N0M0, adenocarcinoma of the pancreatic head ? ?Intent: Curative ? ?Radiation Treatment Dates: 07/22/2021 through 07/31/2021 ?Site Technique Total Dose (Gy) Dose per Fx (Gy) Completed Fx Beam Energies  ?Pancreas: Pancreas IMRT 33/33 6.6 5/5 10XFFF  ? ?Narrative: The patient tolerated radiation therapy relatively well. She did have some fatigue and constipation during therapy. ? ?Plan: The patient will receive a call in about one month from the radiation oncology department. She will continue follow up with Dr. Burr Medico as well.  ? ?________________________________________________ ? ? ? ?Carola Rhine, PAC  ?

## 2021-09-10 ENCOUNTER — Inpatient Hospital Stay: Payer: Medicare Other | Admitting: Hematology

## 2021-09-10 ENCOUNTER — Other Ambulatory Visit: Payer: Self-pay

## 2021-09-10 ENCOUNTER — Inpatient Hospital Stay: Payer: Medicare Other | Attending: Hematology

## 2021-09-10 ENCOUNTER — Encounter: Payer: Self-pay | Admitting: Hematology

## 2021-09-10 VITALS — BP 128/67 | HR 72 | Temp 98.7°F | Resp 18 | Ht 66.0 in | Wt 129.8 lb

## 2021-09-10 DIAGNOSIS — E1142 Type 2 diabetes mellitus with diabetic polyneuropathy: Secondary | ICD-10-CM | POA: Diagnosis not present

## 2021-09-10 DIAGNOSIS — G620 Drug-induced polyneuropathy: Secondary | ICD-10-CM | POA: Diagnosis not present

## 2021-09-10 DIAGNOSIS — T451X5A Adverse effect of antineoplastic and immunosuppressive drugs, initial encounter: Secondary | ICD-10-CM | POA: Diagnosis not present

## 2021-09-10 DIAGNOSIS — Z79899 Other long term (current) drug therapy: Secondary | ICD-10-CM | POA: Insufficient documentation

## 2021-09-10 DIAGNOSIS — C250 Malignant neoplasm of head of pancreas: Secondary | ICD-10-CM

## 2021-09-10 DIAGNOSIS — Z95828 Presence of other vascular implants and grafts: Secondary | ICD-10-CM

## 2021-09-10 DIAGNOSIS — R252 Cramp and spasm: Secondary | ICD-10-CM | POA: Insufficient documentation

## 2021-09-10 LAB — CMP (CANCER CENTER ONLY)
ALT: 32 U/L (ref 0–44)
AST: 29 U/L (ref 15–41)
Albumin: 3.6 g/dL (ref 3.5–5.0)
Alkaline Phosphatase: 196 U/L — ABNORMAL HIGH (ref 38–126)
Anion gap: 6 (ref 5–15)
BUN: 15 mg/dL (ref 8–23)
CO2: 29 mmol/L (ref 22–32)
Calcium: 9 mg/dL (ref 8.9–10.3)
Chloride: 103 mmol/L (ref 98–111)
Creatinine: 0.81 mg/dL (ref 0.44–1.00)
GFR, Estimated: >60 mL/min (ref >60)
Glucose, Bld: 167 mg/dL — ABNORMAL HIGH (ref 70–99)
Potassium: 4.3 mmol/L (ref 3.5–5.1)
Sodium: 138 mmol/L (ref 135–145)
Total Bilirubin: 0.4 mg/dL (ref 0.3–1.2)
Total Protein: 7 g/dL (ref 6.5–8.1)

## 2021-09-10 LAB — CBC WITH DIFFERENTIAL (CANCER CENTER ONLY)
Abs Immature Granulocytes: 0.02 10*3/uL (ref 0.00–0.07)
Basophils Absolute: 0 10*3/uL (ref 0.0–0.1)
Basophils Relative: 1 %
Eosinophils Absolute: 0.1 10*3/uL (ref 0.0–0.5)
Eosinophils Relative: 2 %
HCT: 31.6 % — ABNORMAL LOW (ref 36.0–46.0)
Hemoglobin: 10.4 g/dL — ABNORMAL LOW (ref 12.0–15.0)
Immature Granulocytes: 0 %
Lymphocytes Relative: 28 %
Lymphs Abs: 1.3 10*3/uL (ref 0.7–4.0)
MCH: 27.7 pg (ref 26.0–34.0)
MCHC: 32.9 g/dL (ref 30.0–36.0)
MCV: 84 fL (ref 80.0–100.0)
Monocytes Absolute: 0.4 10*3/uL (ref 0.1–1.0)
Monocytes Relative: 9 %
Neutro Abs: 2.7 10*3/uL (ref 1.7–7.7)
Neutrophils Relative %: 60 %
Platelet Count: 233 10*3/uL (ref 150–400)
RBC: 3.76 MIL/uL — ABNORMAL LOW (ref 3.87–5.11)
RDW: 14.6 % (ref 11.5–15.5)
WBC Count: 4.5 10*3/uL (ref 4.0–10.5)
nRBC: 0 % (ref 0.0–0.2)

## 2021-09-10 LAB — LIPID PANEL
Cholesterol: 198 mg/dL (ref 0–200)
HDL: 60 mg/dL (ref >40)
LDL Cholesterol: 122 mg/dL — ABNORMAL HIGH (ref 0–99)
Total CHOL/HDL Ratio: 3.3 RATIO
Triglycerides: 81 mg/dL (ref <150)
VLDL: 16 mg/dL (ref 0–40)

## 2021-09-10 LAB — TSH: TSH: 0.742 u[IU]/mL (ref 0.350–4.500)

## 2021-09-10 LAB — HEMOGLOBIN A1C
Hgb A1c MFr Bld: 7 % — ABNORMAL HIGH (ref 4.8–5.6)
Mean Plasma Glucose: 154.2 mg/dL

## 2021-09-10 MED ORDER — SODIUM CHLORIDE 0.9% FLUSH
10.0000 mL | Freq: Once | INTRAVENOUS | Status: AC
  Administered 2021-09-10: 10 mL

## 2021-09-10 MED ORDER — HEPARIN SOD (PORK) LOCK FLUSH 100 UNIT/ML IV SOLN
500.0000 [IU] | Freq: Once | INTRAVENOUS | Status: AC
  Administered 2021-09-10: 500 [IU]

## 2021-09-10 NOTE — Progress Notes (Signed)
Lipid Panel, TSH, and Hemoglobin A1C lab orders requested by pt's PCP Dr. [name] with [facility].  Entered all orders but the Hemoglobin A1C which [facility] cannot draw.  Notified Dr. Theda Sers' office.

## 2021-09-10 NOTE — Progress Notes (Signed)
[place]   [phone] [phone]   Clinic Follow up Note   Patient Care Team: [name], DO as PCP - General (Family Medicine) [name], RN (Inactive) as Oncology Nurse Navigator [name], MD as Consulting Physician (Oncology) [name], MD as Consulting Physician (Gastroenterology)  Date of Service:  09/10/2021  CHIEF COMPLAINT: f/u of pancreatic cancer  CURRENT THERAPY:  Surveillance  ASSESSMENT & PLAN:  Tina Patel is a 81 y.o. female with   1. 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 Initial EUS on 09/19/20 showed mass at head of pancreas, but biopsy was negative. She had ERCP stent placement same day. -repeat EUS biopsy on 09/26/20 showed adenocarcinoma from pancreatic primary. CT CAP showed mass to be 4.5 cm, no evidence of metastatic disease, benign cystic liver lesions.   -She began neoadjuvant gemcitabine on 10/07/20, Abraxane was added with cycle 2 and changed to every 2 weeks. She did not tolerate dose increase. Final dose (C15) on 06/10/21. -she previously told me she does not want surgery (which is very reasonable decision); she would prefer to continue chemo.  -CT CAP on 05/09/21 showed stable disease, however there is suspicion for regional infiltration; no evidence of nodal or distal metastatic disease. -EUS/EGD with fiducial marker placement on 07/02/21 by Dr. Rush Landmark. Pancreatic mass measured 3.5 cm. -she received SBRT under Dr. Lisbeth Renshaw 4/5-4/14/23. She tolerated well. -labs reviewed, overall stable. We will plan for post-treatment CT in 5-6 weeks. -her CA19.9 was trending up last few month, will continue f/u   2. Low Appetite, Leg Cramps -her husband notes she isn't eating much. Weight has been stable lately. -she notes leg cramps today, likely due to her diet. I advised her to try magnesium or calcium  supplements.   3. 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 Genetic Testing -she underwent testing on 10/06/20, results were negative.   5. 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 I also called in gabapentin for her 08/03/21.     PLAN: -f/u in 6 weeks, with lab/flush and CT several days before   No problem-specific Assessment & Plan notes found for this encounter.   SUMMARY OF ONCOLOGIC HISTORY: Oncology History Overview Note  Cancer Staging Pancreatic cancer [facility]) Staging form: Exocrine Pancreas, AJCC 8th Edition - Clinical stage from 09/26/2020: Stage IB (cT2, cN0, cM0) - Signed by [name], MD on 09/29/2020 Stage prefix: Initial diagnosis Total positive nodes: 0    Pancreatic cancer ([place])  09/18/2020 Imaging   US Abdomen  IMPRESSION: Intrahepatic and extrahepatic biliary ductal dilatation. Common bile duct dilated up to 13 mm. This could be further evaluated with MRCP or ERCP.   Sludge within the gallbladder with associated gallbladder wall thickening and pericholecystic fluid. Cannot exclude acute cholecystitis.   Multiple hepatic cysts. Complex cystic area posteriorly in the right hepatic lobe. This could also be further evaluated with MRI.   09/19/2020 Procedure   EUS and ERCP by Dr Benson Norway   EUS IMPRESSION - A mass was identified in the pancreatic head. The staging applies if malignancy is confirmed. Fine needle aspiration performed. - There was dilation in the common hepatic duct which measured up to 13 mm. - There was dilation in the intrahepatic bile ducts, diffusely. - A cyst was found in the left lobe of the liver and measured 24 mm by 15 mm. - Endosonographic images  of the left adrenal gland were unremarkable.  ERCP IMPRESSION - The major papilla appeared normal. - A biliary sphincterotomy was performed. - Cells for cytology obtained distal CBD. - One covered metal stent was placed  into the common bile duct.     09/19/2020 Pathology Results   A. PANCREAS, HEAD, FINE NEEDLE ASPIRATION:  - Benign reactive/reparative changes  - Bland spindle cell fragments   DIAGNOSTIC COMMENTS:  There are fragments of bland spindle cells, favored to represent smooth muscle tissue. The glandular fragments are also bland and thus, there is no definitive malignancy identified   B. BILIARY, STRICTURE, BRUSHING:  - Benign reactive/reparative changes   09/20/2020 Imaging   MRI Abdomen  IMPRESSION: 1. 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 Recommend ERCP/EUS or tissue sampling. 2. There is mild biliary ductal dilatation of the common hepatic bile duct and the intrahepatic bile ducts in the LEFT hepatic lobe. Sequences are severely degraded by patient motion. Concern for obstruction of the common bile duct by the pancreatic head mass. 3. Multiple cystic lesions throughout the liver parenchyma. Assessment of enhancement is difficult due to patient motion as well as no true noncontrast T1 weighted imaging as described above. Favor complex benign cysts but consider liver protocol contrast CT for further evaluation of the cystic lesions to exclude metastasis.   09/21/2020 Imaging   CT CAP  IMPRESSION: 1. Heterogeneous mass of the central pancreatic head measuring 4.5 x 3.3 cm. There is atrophy of the distal pancreatic parenchyma with diffuse pancreatic ductal dilatation up to 0.8 cm. Mass appears to closely abut the lateral surface of the portal vein near the confluence. The superior mesenteric artery is well separated by a fat plane, as is the hepatic artery. 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 The exact borders and size of the mass are better delineated by prior MR. 2. No evidence of metastatic disease in the chest, abdomen, or pelvis. 3. There are numerous lobulated, fluid  attenuating cysts throughout the hepatic parenchyma, numerous additional subcentimeter lesions are too small to characterize although demonstrate no evidence of contrast enhancement and are likely additional subcentimeter cysts. Attention on follow-up. 4. Status post common bile duct stent with post stenting pneumobilia. 5. Thickening and fat stranding about the gallbladder as well as about the adjacent duodenal bulb and descending duodenum, of uncertain etiology, concerning for cholecystitis, enteritis, and/or pancreatitis, possibly related to recent endoscopic procedure. Aortic Atherosclerosis (ICD10-I70.0).   09/26/2020 Cancer Staging   Staging form: Exocrine Pancreas, AJCC 8th Edition - Clinical stage from 09/26/2020: Stage IIA (cT3, cN0, cM0) - Signed by [name], MD on 09/29/2020 Stage prefix: Initial diagnosis Total positive nodes: 0    09/26/2020 Procedure   EUS by Dr Benson Norway  IMPRESSION - A mass was identified in the pancreatic head. Cytology results are pending. However, the endosonographic appearance is consistent with adenocarcinoma. Fine needle aspiration performed.   09/26/2020 Initial Biopsy   A. PANCREAS, HEAD, FINE NEEDLE ASPIRATION:  - Malignant cells consistent with adenocarcinoma    09/29/2020 Initial Diagnosis   Pancreatic cancer ([place])    10/06/2020 Genetic Testing   Negative genetic testing. No pathogenic variants identified on the [facility] CancerNext-Expanded+RNA panel. The report date is 10/16/2020.  The CancerNext-Expanded + RNAinsight gene panel offered by [employer] and includes sequencing and rearrangement analysis for the following 77 genes: IP, ALK, APC*, ATM*, AXIN2, BAP1, BARD1, BLM, BMPR1A, BRCA1*, BRCA2*, BRIP1*, CDC73, CDH1*,CDK4, CDKN1B, CDKN2A,  CHEK2*, CTNNA1, DICER1, FANCC, FH, FLCN, GALNT12, KIF1B, LZTR1, MAX, MEN1, MET, MLH1*, MSH2*, MSH3, MSH6*, MUTYH*, NBN, NF1*, NF2, NTHL1, PALB2*, PHOX2B, PMS2*, POT1, PRKAR1A, PTCH1, PTEN*, RAD51C*, RAD51D*,RB1,  RECQL, RET, SDHA, SDHAF2, SDHB, SDHC, SDHD, SMAD4, SMARCA4, SMARCB1, SMARCE1, STK11, SUFU, TMEM127, TP53*,TSC1, TSC2, VHL and XRCC2 (sequencing and deletion/duplication); EGFR, EGLN1, HOXB13, KIT, MITF, PDGFRA, POLD1 and POLE (sequencing only); EPCAM and GREM1 (deletion/duplication only).   10/07/2020 -  Chemotherapy   First-line Gemcitabine and Abraxane 2 weeks on/1 week off starting 10/07/20.  C1 given with Gemcitabine alone.  Abraxane added with cycle 2 and changed to every 2 weeks   01/06/2021 Imaging   CT CAP at [facility] Impression:  1.  Similar pancreatic head mass with unchanged associated vascular involvement and pancreatic ductal dilatation.  2.  Stable multifocal hypodense liver lesions, some of which are indeterminate. Consider MR liver with and without IV contrast for further evaluation.  3.  No evidence of new or increasing metastatic disease in the chest, abdomen or pelvis.    05/09/2021 Imaging   EXAM: CT CHEST, ABDOMEN, AND PELVIS WITH CONTRAST  IMPRESSION: Stable disease with stable heterogeneous mass within the pancreatic head. 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 There is, however, increasing soft tissue infiltration surrounding the superior mesenteric artery suspicious for regional infiltration of disease. No evidence of regional nodal or distant metastatic disease within the chest, abdomen, and pelvis.   Pallidum biliary stenting of the extrahepatic bile duct with secondary pneumobilia again noted.   Multiple simple and minimally complex hepatic cysts identified. No focal intrahepatic masses noted.   Distal colonic diverticulosis without superimposed acute inflammatory change.   Aortic Atherosclerosis (ICD10-I70.0).   07/02/2021 Procedure   Upper EUS/EGD, Dr. Rush Landmark  Impression: EGD Impression: - No gross lesions in esophagus. Z-line irregular, 33 cm from the incisors. - 3 cm hiatal hernia. - Erythematous mucosa in the stomach. Biopsied. -  Acquired duodenal stenosis. Unable to visualize ampullary orifice due to stenosis. Though adult endoscope can traverse the region.  EUS Impression: - A mass was identified in the pancreatic head. A tissue diagnosis was obtained prior to this exam. This is consistent with adenocarcinoma. Fiducial markers were deployed. - One stent was visualized endosonographically in the common bile duct. - Hyperechoic material consistent with sludge was visualized endosonographically in the gallbladder.   07/02/2021 Pathology Results   FINAL MICROSCOPIC DIAGNOSIS:   A. STOMACH, BIOPSY:  - Gastric antral and oxyntic mucosa with mild chronic gastritis  - Immunohistochemical stain for Helicobacter pylori is negative      INTERVAL HISTORY:  Tina Patel is here for a follow up of pancreatic cancer. She was last seen by me on 08/03/21. She presents to the clinic accompanied by her husband. She reports leg cramps today; I advised her to try magnesium or calcium supplements. Her husband notes she isn't eating much.   All other systems were reviewed with the patient and are negative.  MEDICAL HISTORY:  Past Medical History:  Diagnosis Date   Diabetes ([place])    Diabetes mellitus    Family history of colon cancer    Family history of lung cancer    Family history of multiple myeloma    Family history of prostate cancer    High cholesterol    Hypertension    Hypothyroid     SURGICAL HISTORY: Past Surgical History:  Procedure Laterality Date   BILIARY BRUSHING  09/19/2020   Procedure: BILIARY BRUSHING;  Surgeon: [name], MD;  Location: [facility];  Service: Endoscopy;;   BILIARY STENT PLACEMENT N/A 09/19/2020   Procedure: BILIARY STENT PLACEMENT;  Surgeon: [name], MD;  Location: [facility];  Service: Endoscopy;  Laterality: N/A;   BIOPSY  07/02/2021   Procedure: BIOPSY;  Surgeon: Rush Landmark Telford Nab., MD;  Location: [place];  Service: Gastroenterology;;   ERCP N/A 09/19/2020    Procedure: ENDOSCOPIC RETROGRADE CHOLANGIOPANCREATOGRAPHY (ERCP);  Surgeon: [name], MD;  Location: [facility];  Service: Endoscopy;  Laterality: N/A;   ESOPHAGOGASTRODUODENOSCOPY (EGD) WITH PROPOFOL N/A 09/19/2020   Procedure: ESOPHAGOGASTRODUODENOSCOPY (EGD) WITH PROPOFOL;  Surgeon: [name], MD;  [facility]: [facility];  Service: Endoscopy;  Laterality: N/A;   ESOPHAGOGASTRODUODENOSCOPY (EGD) WITH PROPOFOL N/A 09/26/2020   Procedure: ESOPHAGOGASTRODUODENOSCOPY (EGD) WITH PROPOFOL;  Surgeon: [name], MD;  Location: [facility];  Service: Endoscopy;  Laterality: N/A;   ESOPHAGOGASTRODUODENOSCOPY (EGD) WITH PROPOFOL N/A 07/02/2021   Procedure: ESOPHAGOGASTRODUODENOSCOPY (EGD) WITH PROPOFOL;  Surgeon: Rush Landmark Telford Nab., MD;  Location: [place];  Service: Gastroenterology;  Laterality: N/A;   EUS N/A 09/19/2020   Procedure: UPPER ENDOSCOPIC ULTRASOUND (EUS) LINEAR;  Surgeon: [name], MD;  Location: [facility];  Service: Endoscopy;  Laterality: N/A;   EUS N/A 07/02/2021   Procedure: UPPER ENDOSCOPIC ULTRASOUND (EUS) RADIAL;  Surgeon: Irving Copas., MD;  Location: [place];  Service: Gastroenterology;  Laterality: N/A;   FIDUCIAL MARKER PLACEMENT N/A 07/02/2021   Procedure: FIDUCIAL MARKER PLACEMENT;  Surgeon: Irving Copas., MD;  Location: [place];  Service: Gastroenterology;  Laterality: N/A;   FINE NEEDLE ASPIRATION  09/19/2020   Procedure: FINE NEEDLE ASPIRATION;  Surgeon: [name], MD;  [facility];  Service: Endoscopy;;   FINE NEEDLE ASPIRATION N/A 09/26/2020   Procedure: FINE NEEDLE ASPIRATION (FNA) LINEAR;  Surgeon: [name], MD;  Location: [facility];  Service: Endoscopy;  Laterality: N/A;   FINGER SURGERY     IR IMAGING GUIDED PORT INSERTION  11/24/2020   SPHINCTEROTOMY  09/19/2020   Procedure: SPHINCTEROTOMY;  Surgeon: [name], MD;  Location: [facility];  Service: Endoscopy;;   UPPER ESOPHAGEAL ENDOSCOPIC  ULTRASOUND (EUS) N/A 09/26/2020   Procedure: UPPER ESOPHAGEAL ENDOSCOPIC ULTRASOUND (EUS);  Surgeon: [name], MD;  Location: [facility];  Service: Endoscopy;  Laterality: N/A;    I have reviewed the social history and family history with the patient and they are unchanged from previous note.  ALLERGIES:  has No Known Allergies.  MEDICATIONS:  Current Outpatient Medications  Medication Sig Dispense Refill   amLODipine (NORVASC) 5 MG tablet Take 5 mg by mouth daily.     blood glucose meter kit and supplies KIT Dispense based on patient and insurance preference. Use up to four times daily as directed. 1 each 0   furosemide (LASIX) 20 MG tablet Take 20 mg by mouth daily.     gabapentin (NEURONTIN) 100 MG capsule Take 1 capsule (100 mg total) by mouth at bedtime. 60 capsule 1   insulin glargine (LANTUS) 100 UNIT/ML Solostar Pen Inject 8 Units into the skin daily. (Patient taking differently: Inject 10 Units into the skin daily.) 15 mL 0   levothyroxine (SYNTHROID) 75 MCG tablet Take 75 mcg by mouth daily before breakfast.     lidocaine-prilocaine (EMLA) cream Apply 1 application topically as needed. 30 g 2   lisinopril (ZESTRIL) 10 MG tablet Take 10 mg by mouth daily.     mirtazapine (REMERON) 7.5 MG tablet TAKE 1 TABLET BY MOUTH EVERYDAY AT BEDTIME 90 tablet 1   ondansetron (ZOFRAN) 8  MG tablet Take 1 tablet (8 mg total) by mouth 2 (two) times daily as needed (Nausea or vomiting). (Patient not taking: Reported on 06/29/2021) 30 tablet 1   OVER THE COUNTER MEDICATION Apply 1 application. topically daily as needed (Itching). Itching cream     pantoprazole (PROTONIX) 20 MG tablet Take 1 tablet (20 mg total) by mouth daily. 30 tablet 1   prochlorperazine (COMPAZINE) 10 MG tablet TAKE ONE TABLET BY MOUTH (10 mg) every 6 HOURS AS NEEDED (nausea or vomiting) 30 tablet 0   traMADol (ULTRAM) 50 MG tablet TAKE ONE TABLET BY MOUTH every 12 HOURS AS NEEDED 16 tablet 0   Vitamin D, Ergocalciferol, 50  MCG (2000 UT) CAPS Take 2,000 Units by mouth daily.     No current facility-administered medications for this visit.    PHYSICAL EXAMINATION: ECOG PERFORMANCE STATUS: 1 - Symptomatic but completely ambulatory  Vitals:   09/10/21 1201  BP: 128/67  Pulse: 72  Resp: 18  Temp: 98.7 F (37.1 C)  SpO2: 100%   Wt Readings from Last 3 Encounters:  09/10/21 129 lb 12.8 oz (58.9 kg)  08/03/21 128 lb 8 oz (58.3 kg)  07/02/21 130 lb (59 kg)     GENERAL:alert, no distress and comfortable SKIN: skin color normal, no rashes or significant lesions EYES: normal, Conjunctiva are pink and non-injected, sclera clear  NEURO: alert & oriented x 3 with fluent speech  LABORATORY DATA:  I have reviewed the data as listed    Latest Ref Rng & Units 09/10/2021   11:38 AM 08/03/2021   11:18 AM 06/10/2021   10:57 AM  CBC  WBC 4.0 - 10.5 K/uL 4.5   3.9   4.0    Hemoglobin 12.0 - 15.0 g/dL 10.4   10.4   10.6    Hematocrit 36.0 - 46.0 % 31.6   32.5   32.7    Platelets 150 - 400 K/uL 233   237   271          Latest Ref Rng & Units 09/10/2021   11:38 AM 08/03/2021   11:18 AM 06/10/2021   10:57 AM  CMP  Glucose 70 - 99 mg/dL 167   75   223    BUN 8 - 23 mg/dL 15   13   20     Creatinine 0.44 - 1.00 mg/dL 0.81   0.75   0.90    Sodium 135 - 145 mmol/L 138   138   138    Potassium 3.5 - 5.1 mmol/L 4.3   4.1   4.3    Chloride 98 - 111 mmol/L 103   104   105    CO2 22 - 32 mmol/L 29   28   27     Calcium 8.9 - 10.3 mg/dL 9.0   9.1   8.9    Total Protein 6.5 - 8.1 g/dL 7.0   7.0   6.6    Total Bilirubin 0.3 - 1.2 mg/dL 0.4   0.3   0.4    Alkaline Phos 38 - 126 U/L 196   72   66    AST 15 - 41 U/L 29   15   16     ALT 0 - 44 U/L 32   8   9        RADIOGRAPHIC STUDIES: I have personally reviewed the radiological images as listed and agreed with the findings in the report. No results found.    Orders Placed This Encounter  Procedures   CT ABDOMEN PELVIS W CONTRAST    Standing Status:   Future     Standing Expiration Date:   09/11/2022    Order Specific Question:   If indicated for the ordered procedure, I authorize the administration of contrast media per Radiology protocol    Answer:   Yes    Order Specific Question:   Preferred imaging location?    Answer:   [facility]    Order Specific Question:   Is Oral Contrast requested for this exam?    Answer:   Yes, Per Radiology protocol   All questions were answered. The patient knows to call the clinic with any problems, questions or concerns. No barriers to learning was detected. The total time spent in the appointment was 30 minutes.     [name], MD 09/10/2021   I, Wilburn Mylar, am acting as scribe for [name], MD.   I have reviewed the above documentation for accuracy and completeness, and I agree with the above.

## 2021-09-11 LAB — CANCER ANTIGEN 19-9: CA 19-9: 32 U/mL (ref 0–35)

## 2021-09-11 LAB — THYROID PANEL WITH TSH
Free Thyroxine Index: 3 (ref 1.2–4.9)
T3 Uptake Ratio: 29 % (ref 24–39)
T4, Total: 10.2 ug/dL (ref 4.5–12.0)
TSH: 0.663 u[IU]/mL (ref 0.450–4.500)

## 2021-09-15 ENCOUNTER — Ambulatory Visit
Admission: RE | Admit: 2021-09-15 | Discharge: 2021-09-15 | Disposition: A | Discharge status: Home / Self Care | Payer: Medicare Other | Source: Ambulatory Visit | Attending: Hematology | Admitting: Hematology

## 2021-09-15 DIAGNOSIS — C250 Malignant neoplasm of head of pancreas: Secondary | ICD-10-CM

## 2021-09-15 NOTE — Progress Notes (Signed)
  Radiation Oncology         [phone] ________________________________  Name: Tina Patel MRN: [id number]  Date of Service: 09/15/2021  DOB: 05/27/1940  Post Treatment Telephone Note  Diagnosis:   Stage IIA, cT3N0M0, adenocarcinoma of the pancreatic head  Intent: Curative  Radiation Treatment Dates: 07/22/2021 through 07/31/2021 Site Technique Total Dose (Gy) Dose per Fx (Gy) Completed Fx Beam Energies  Pancreas: Pancreas IMRT 33/33 6.6 5/5 10XFFF   Narrative: The patient tolerated radiation therapy relatively well. She did have some fatigue and constipation during therapy.   Impression/Plan: 1. Stage IIA, cT3N0M0, adenocarcinoma of the pancreatic head.  I was unable to reach the patient but left a voicemail and on the message, I discussed that we would be happy to continue to follow her as needed, but she will also continue to follow up with Dr. Burr Medico in medical oncology. She was counseled to call if she had questions or concerns.        Carola Rhine, PAC

## 2021-09-22 ENCOUNTER — Other Ambulatory Visit: Payer: Self-pay | Admitting: Hematology

## 2021-10-09 ENCOUNTER — Other Ambulatory Visit: Payer: Self-pay

## 2021-10-09 DIAGNOSIS — C250 Malignant neoplasm of head of pancreas: Secondary | ICD-10-CM

## 2021-10-12 ENCOUNTER — Inpatient Hospital Stay: Payer: Medicare Other | Attending: Hematology

## 2021-10-12 ENCOUNTER — Ambulatory Visit (HOSPITAL_COMMUNITY)
Admission: RE | Admit: 2021-10-12 | Discharge: 2021-10-12 | Disposition: A | Discharge status: Home / Self Care | Payer: Medicare Other | Source: Ambulatory Visit | Attending: Hematology | Admitting: Hematology

## 2021-10-12 ENCOUNTER — Other Ambulatory Visit: Payer: Self-pay

## 2021-10-12 VITALS — BP 119/59 | HR 70 | Temp 98.1°F | Resp 18

## 2021-10-12 DIAGNOSIS — Z79899 Other long term (current) drug therapy: Secondary | ICD-10-CM | POA: Diagnosis not present

## 2021-10-12 DIAGNOSIS — C250 Malignant neoplasm of head of pancreas: Secondary | ICD-10-CM | POA: Insufficient documentation

## 2021-10-12 DIAGNOSIS — K5730 Diverticulosis of large intestine without perforation or abscess without bleeding: Secondary | ICD-10-CM | POA: Diagnosis not present

## 2021-10-12 DIAGNOSIS — C787 Secondary malignant neoplasm of liver and intrahepatic bile duct: Secondary | ICD-10-CM | POA: Diagnosis not present

## 2021-10-12 DIAGNOSIS — K831 Obstruction of bile duct: Secondary | ICD-10-CM | POA: Diagnosis not present

## 2021-10-12 DIAGNOSIS — Z79890 Hormone replacement therapy (postmenopausal): Secondary | ICD-10-CM | POA: Diagnosis not present

## 2021-10-12 DIAGNOSIS — Z808 Family history of malignant neoplasm of other organs or systems: Secondary | ICD-10-CM | POA: Insufficient documentation

## 2021-10-12 DIAGNOSIS — E119 Type 2 diabetes mellitus without complications: Secondary | ICD-10-CM | POA: Diagnosis not present

## 2021-10-12 DIAGNOSIS — K449 Diaphragmatic hernia without obstruction or gangrene: Secondary | ICD-10-CM | POA: Diagnosis not present

## 2021-10-12 DIAGNOSIS — I10 Essential (primary) hypertension: Secondary | ICD-10-CM | POA: Diagnosis not present

## 2021-10-12 DIAGNOSIS — C801 Malignant (primary) neoplasm, unspecified: Secondary | ICD-10-CM | POA: Diagnosis not present

## 2021-10-12 DIAGNOSIS — K7689 Other specified diseases of liver: Secondary | ICD-10-CM | POA: Diagnosis not present

## 2021-10-12 DIAGNOSIS — E785 Hyperlipidemia, unspecified: Secondary | ICD-10-CM | POA: Diagnosis not present

## 2021-10-12 DIAGNOSIS — E039 Hypothyroidism, unspecified: Secondary | ICD-10-CM | POA: Insufficient documentation

## 2021-10-12 DIAGNOSIS — Z95828 Presence of other vascular implants and grafts: Secondary | ICD-10-CM

## 2021-10-12 DIAGNOSIS — Z8042 Family history of malignant neoplasm of prostate: Secondary | ICD-10-CM | POA: Insufficient documentation

## 2021-10-12 DIAGNOSIS — Z800 Family history of malignant neoplasm of digestive organs: Secondary | ICD-10-CM | POA: Insufficient documentation

## 2021-10-12 DIAGNOSIS — H9190 Unspecified hearing loss, unspecified ear: Secondary | ICD-10-CM | POA: Diagnosis not present

## 2021-10-12 DIAGNOSIS — Z801 Family history of malignant neoplasm of trachea, bronchus and lung: Secondary | ICD-10-CM | POA: Insufficient documentation

## 2021-10-12 DIAGNOSIS — K315 Obstruction of duodenum: Secondary | ICD-10-CM | POA: Diagnosis not present

## 2021-10-12 DIAGNOSIS — G620 Drug-induced polyneuropathy: Secondary | ICD-10-CM | POA: Insufficient documentation

## 2021-10-12 DIAGNOSIS — T451X5A Adverse effect of antineoplastic and immunosuppressive drugs, initial encounter: Secondary | ICD-10-CM | POA: Insufficient documentation

## 2021-10-12 DIAGNOSIS — I700 Atherosclerosis of aorta: Secondary | ICD-10-CM | POA: Diagnosis not present

## 2021-10-12 LAB — COMPREHENSIVE METABOLIC PANEL
ALT: 15 U/L (ref 0–44)
AST: 19 U/L (ref 15–41)
Albumin: 3.6 g/dL (ref 3.5–5.0)
Alkaline Phosphatase: 96 U/L (ref 38–126)
Anion gap: 5 (ref 5–15)
BUN: 13 mg/dL (ref 8–23)
CO2: 27 mmol/L (ref 22–32)
Calcium: 9 mg/dL (ref 8.9–10.3)
Chloride: 108 mmol/L (ref 98–111)
Creatinine, Ser: 0.8 mg/dL (ref 0.44–1.00)
GFR, Estimated: >60 mL/min (ref >60)
Glucose, Bld: 69 mg/dL — ABNORMAL LOW (ref 70–99)
Potassium: 3.9 mmol/L (ref 3.5–5.1)
Sodium: 140 mmol/L (ref 135–145)
Total Bilirubin: 0.5 mg/dL (ref 0.3–1.2)
Total Protein: 6.6 g/dL (ref 6.5–8.1)

## 2021-10-12 LAB — CBC WITH DIFFERENTIAL/PLATELET
Abs Immature Granulocytes: 0.01 10*3/uL (ref 0.00–0.07)
Basophils Absolute: 0 10*3/uL (ref 0.0–0.1)
Basophils Relative: 0 %
Eosinophils Absolute: 0.1 10*3/uL (ref 0.0–0.5)
Eosinophils Relative: 2 %
HCT: 33.1 % — ABNORMAL LOW (ref 36.0–46.0)
Hemoglobin: 10.8 g/dL — ABNORMAL LOW (ref 12.0–15.0)
Immature Granulocytes: 0 %
Lymphocytes Relative: 28 %
Lymphs Abs: 1.4 10*3/uL (ref 0.7–4.0)
MCH: 27.3 pg (ref 26.0–34.0)
MCHC: 32.6 g/dL (ref 30.0–36.0)
MCV: 83.6 fL (ref 80.0–100.0)
Monocytes Absolute: 0.4 10*3/uL (ref 0.1–1.0)
Monocytes Relative: 8 %
Neutro Abs: 3 10*3/uL (ref 1.7–7.7)
Neutrophils Relative %: 62 %
Platelets: 195 10*3/uL (ref 150–400)
RBC: 3.96 MIL/uL (ref 3.87–5.11)
RDW: 14.3 % (ref 11.5–15.5)
WBC: 4.9 10*3/uL (ref 4.0–10.5)
nRBC: 0 % (ref 0.0–0.2)

## 2021-10-12 MED ORDER — SODIUM CHLORIDE 0.9% FLUSH
10.0000 mL | Freq: Once | INTRAVENOUS | Status: AC
  Administered 2021-10-12: 10 mL

## 2021-10-12 MED ORDER — HEPARIN SOD (PORK) LOCK FLUSH 100 UNIT/ML IV SOLN
500.0000 [IU] | Freq: Once | INTRAVENOUS | Status: AC
  Administered 2021-10-12: 500 [IU]

## 2021-10-12 MED ORDER — HEPARIN SOD (PORK) LOCK FLUSH 100 UNIT/ML IV SOLN
500.0000 [IU] | Freq: Once | INTRAVENOUS | Status: AC
  Administered 2021-10-12: 500 [IU] via INTRAVENOUS

## 2021-10-12 MED ORDER — IOHEXOL 300 MG/ML  SOLN
100.0000 mL | Freq: Once | INTRAMUSCULAR | Status: AC | PRN
  Administered 2021-10-12: 100 mL via INTRAVENOUS

## 2021-10-12 MED ORDER — HEPARIN SOD (PORK) LOCK FLUSH 100 UNIT/ML IV SOLN
INTRAVENOUS | Status: AC
  Filled 2021-10-12: qty 5

## 2021-10-16 ENCOUNTER — Inpatient Hospital Stay (HOSPITAL_BASED_OUTPATIENT_CLINIC_OR_DEPARTMENT_OTHER): Payer: Medicare Other | Admitting: Hematology

## 2021-10-16 ENCOUNTER — Other Ambulatory Visit: Payer: Self-pay

## 2021-10-16 ENCOUNTER — Encounter: Payer: Self-pay | Admitting: Hematology

## 2021-10-16 VITALS — BP 136/77 | HR 86 | Temp 98.6°F | Resp 17 | Wt 133.7 lb

## 2021-10-16 DIAGNOSIS — C250 Malignant neoplasm of head of pancreas: Secondary | ICD-10-CM

## 2021-10-16 DIAGNOSIS — H9190 Unspecified hearing loss, unspecified ear: Secondary | ICD-10-CM | POA: Diagnosis not present

## 2021-10-16 DIAGNOSIS — E785 Hyperlipidemia, unspecified: Secondary | ICD-10-CM | POA: Diagnosis not present

## 2021-10-16 DIAGNOSIS — E119 Type 2 diabetes mellitus without complications: Secondary | ICD-10-CM | POA: Diagnosis not present

## 2021-10-16 DIAGNOSIS — T451X5A Adverse effect of antineoplastic and immunosuppressive drugs, initial encounter: Secondary | ICD-10-CM | POA: Diagnosis not present

## 2021-10-16 DIAGNOSIS — I10 Essential (primary) hypertension: Secondary | ICD-10-CM | POA: Diagnosis not present

## 2021-10-16 DIAGNOSIS — Z800 Family history of malignant neoplasm of digestive organs: Secondary | ICD-10-CM | POA: Diagnosis not present

## 2021-10-16 DIAGNOSIS — I700 Atherosclerosis of aorta: Secondary | ICD-10-CM | POA: Diagnosis not present

## 2021-10-16 DIAGNOSIS — K831 Obstruction of bile duct: Secondary | ICD-10-CM | POA: Diagnosis not present

## 2021-10-16 DIAGNOSIS — K315 Obstruction of duodenum: Secondary | ICD-10-CM | POA: Diagnosis not present

## 2021-10-16 DIAGNOSIS — K449 Diaphragmatic hernia without obstruction or gangrene: Secondary | ICD-10-CM | POA: Diagnosis not present

## 2021-10-16 DIAGNOSIS — C787 Secondary malignant neoplasm of liver and intrahepatic bile duct: Secondary | ICD-10-CM | POA: Diagnosis not present

## 2021-10-16 DIAGNOSIS — Z79899 Other long term (current) drug therapy: Secondary | ICD-10-CM | POA: Diagnosis not present

## 2021-10-16 DIAGNOSIS — E039 Hypothyroidism, unspecified: Secondary | ICD-10-CM | POA: Diagnosis not present

## 2021-10-16 DIAGNOSIS — G620 Drug-induced polyneuropathy: Secondary | ICD-10-CM | POA: Diagnosis not present

## 2021-10-16 DIAGNOSIS — Z801 Family history of malignant neoplasm of trachea, bronchus and lung: Secondary | ICD-10-CM | POA: Diagnosis not present

## 2021-10-16 DIAGNOSIS — K5730 Diverticulosis of large intestine without perforation or abscess without bleeding: Secondary | ICD-10-CM | POA: Diagnosis not present

## 2021-10-16 DIAGNOSIS — Z808 Family history of malignant neoplasm of other organs or systems: Secondary | ICD-10-CM | POA: Diagnosis not present

## 2021-10-16 NOTE — Progress Notes (Signed)
Verbal order for Hospice given by Dr. Burr Medico.  Referral to [facility] placed.  Margaretmary Eddy w/[facility] confirmed order.  DNR completed today by Dr. Burr Medico with pt and daughter in clinic.  Copy of DNR sent to be scanned into pt's chart.  Original given to pt.

## 2021-10-16 NOTE — Progress Notes (Signed)
[facility]   [phone] [phone]   Clinic Follow up Note   Patient Care Team: [name], DO as PCP - General (Family Medicine) [name], RN (Inactive) as Oncology Nurse Navigator [name], MD as Consulting Physician (Oncology) [name], MD as Consulting Physician (Gastroenterology)  Date of Service:  10/16/2021  CHIEF COMPLAINT: f/u of pancreatic cancer  CURRENT THERAPY:  Surveillance  ASSESSMENT & PLAN:  [name] is a 81 y.o. female with   1. 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 Initial EUS on 09/19/20 showed mass at head of pancreas, but biopsy was negative. She had ERCP stent placement same day. -repeat EUS biopsy on 09/26/20 showed adenocarcinoma from pancreatic primary. CT CAP showed mass to be 4.5 cm, no evidence of metastatic disease, benign cystic liver lesions.   -She began neoadjuvant gemcitabine on 10/07/20, Abraxane was added with cycle 2 and changed to every 2 weeks. Chemo changed back to single agent gemcitabine due to poor tolerance and final dose (C15) on 06/10/21. -she received SBRT under Dr. Lisbeth Renshaw 4/5-4/14/23. She tolerated well  -post-treatment CT AP on 10/12/21 showed local progression with increased size of pancreatic head mass and increased soft tissue encasement of adjacent mesenteric artery and vein, as well as multiple new small hepatic metastatic lesions. I reviewed the images and results with pt and her daughter today. -I discussed the limited treatment options, including single agent gemcitabine, oral chemo capecitabine, will check NGS to see if she is a candidate for targeted therapy which is rarely available in pancreatic cancer.  Due to her advanced age and previous poor tolerance to chemo, I feel the side effects would outweigh the benefits.  Patient and her daughter are also in favor of  quality of life, which she has now, over quantity of life. -I discussed the option of palliative home care and hospice.  I discussed the logistics, what hospice can offer and would not offer, insurance coverage etc.  After lengthy discussion, with her daughter's encouragement, patient agreed to take home hospice care.  2. Goal of care discussion, DNR -We again discussed the incurable nature of her cancer, and the overall poor prognosis, especially if the liver lesions on recent CT are cancerous. -I recommend DNR/DNI, she will think about it    3. 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 Genetic Testing -she underwent testing on 10/06/20, results were negative.   5. 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 I also called in gabapentin for her 08/03/21.     PLAN: -referral to [facility] -f/u open, I will remain to be her attending when she is under hospice care.   No problem-specific Assessment & Plan notes found for this encounter.   SUMMARY OF ONCOLOGIC HISTORY: Oncology History Overview Note  Cancer Staging Pancreatic cancer [facility]) Staging form: Exocrine Pancreas, AJCC 8th Edition - Clinical stage from 09/26/2020: Stage IB (cT2, cN0, cM0) - Signed by [name], MD on 09/29/2020 Stage prefix: Initial diagnosis Total positive nodes: 0    Pancreatic cancer ([place])  09/18/2020 Imaging   US Abdomen  IMPRESSION: Intrahepatic and extrahepatic biliary ductal dilatation. Common bile duct dilated up to 13 mm. This could be further evaluated with MRCP or ERCP.   Sludge within the gallbladder with associated gallbladder wall thickening and pericholecystic fluid. Cannot exclude acute cholecystitis.   Multiple hepatic cysts.  Complex cystic area posteriorly in the right hepatic lobe. This could also be further evaluated with MRI.   09/19/2020 Procedure   EUS and ERCP by Dr Benson Norway   EUS IMPRESSION -  A mass was identified in the pancreatic head. The staging applies if malignancy is confirmed. Fine needle aspiration performed. - There was dilation in the common hepatic duct which measured up to 13 mm. - There was dilation in the intrahepatic bile ducts, diffusely. - A cyst was found in the left lobe of the liver and measured 24 mm by 15 mm. - Endosonographic images of the left adrenal gland were unremarkable.  ERCP IMPRESSION - The major papilla appeared normal. - A biliary sphincterotomy was performed. - Cells for cytology obtained distal CBD. - One covered metal stent was placed into the common bile duct.     09/19/2020 Pathology Results   A. PANCREAS, HEAD, FINE NEEDLE ASPIRATION:  - Benign reactive/reparative changes  - Bland spindle cell fragments   DIAGNOSTIC COMMENTS:  There are fragments of bland spindle cells, favored to represent smooth muscle tissue. The glandular fragments are also bland and thus, there is no definitive malignancy identified   B. BILIARY, STRICTURE, BRUSHING:  - Benign reactive/reparative changes   09/20/2020 Imaging   MRI Abdomen  IMPRESSION: 1. 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 Recommend ERCP/EUS or tissue sampling. 2. There is mild biliary ductal dilatation of the common hepatic bile duct and the intrahepatic bile ducts in the LEFT hepatic lobe. Sequences are severely degraded by patient motion. Concern for obstruction of the common bile duct by the pancreatic head mass. 3. Multiple cystic lesions throughout the liver parenchyma. Assessment of enhancement is difficult due to patient motion as well as no true noncontrast T1 weighted imaging as described above. Favor complex benign cysts but consider liver protocol contrast CT for further evaluation of the cystic lesions to exclude metastasis.   09/21/2020 Imaging   CT CAP  IMPRESSION: 1.  Heterogeneous mass of the central pancreatic head measuring 4.5 x 3.3 cm. There is atrophy of the distal pancreatic parenchyma with diffuse pancreatic ductal dilatation up to 0.8 cm. Mass appears to closely abut the lateral surface of the portal vein near the confluence. The superior mesenteric artery is well separated by a fat plane, as is the hepatic artery. 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 The exact borders and size of the mass are better delineated by prior MR. 2. No evidence of metastatic disease in the chest, abdomen, or pelvis. 3. There are numerous lobulated, fluid attenuating cysts throughout the hepatic parenchyma, numerous additional subcentimeter lesions are too small to characterize although demonstrate no evidence of contrast enhancement and are likely additional subcentimeter cysts. Attention on follow-up. 4. Status post common bile duct stent with post stenting pneumobilia. 5. Thickening and fat stranding about the gallbladder as well as about the adjacent duodenal bulb and descending duodenum, of uncertain etiology, concerning for cholecystitis, enteritis, and/or pancreatitis, possibly related to recent endoscopic procedure. Aortic Atherosclerosis (ICD10-I70.0).   09/26/2020 Cancer Staging   Staging form: Exocrine Pancreas, AJCC 8th Edition - Clinical stage from 09/26/2020: Stage IIA (cT3, cN0, cM0) - Signed by [name], MD on 09/29/2020 Stage prefix: Initial diagnosis Total positive nodes: 0   09/26/2020 Procedure   EUS by Dr Benson Norway  IMPRESSION - A mass was identified in the pancreatic head. Cytology results are pending. However, the endosonographic appearance is consistent with  adenocarcinoma. Fine needle aspiration performed.   09/26/2020 Initial Biopsy   A. PANCREAS, HEAD, FINE NEEDLE ASPIRATION:  - Malignant cells consistent with adenocarcinoma    09/29/2020 Initial Diagnosis   Pancreatic cancer ([place])   10/06/2020 Genetic Testing   Negative  genetic testing. No pathogenic variants identified on the [facility] CancerNext-Expanded+RNA panel. The report date is 10/16/2020.  The CancerNext-Expanded + RNAinsight gene panel offered by [employer] and includes sequencing and rearrangement analysis for the following 77 genes: IP, ALK, APC*, ATM*, AXIN2, BAP1, BARD1, BLM, BMPR1A, BRCA1*, BRCA2*, BRIP1*, CDC73, CDH1*,CDK4, CDKN1B, CDKN2A, CHEK2*, CTNNA1, DICER1, FANCC, FH, FLCN, GALNT12, KIF1B, LZTR1, MAX, MEN1, MET, MLH1*, MSH2*, MSH3, MSH6*, MUTYH*, NBN, NF1*, NF2, NTHL1, PALB2*, PHOX2B, PMS2*, POT1, PRKAR1A, PTCH1, PTEN*, RAD51C*, RAD51D*,RB1, RECQL, RET, SDHA, SDHAF2, SDHB, SDHC, SDHD, SMAD4, SMARCA4, SMARCB1, SMARCE1, STK11, SUFU, TMEM127, TP53*,TSC1, TSC2, VHL and XRCC2 (sequencing and deletion/duplication); EGFR, EGLN1, HOXB13, KIT, MITF, PDGFRA, POLD1 and POLE (sequencing only); EPCAM and GREM1 (deletion/duplication only).   10/07/2020 -  Chemotherapy   First-line Gemcitabine and Abraxane 2 weeks on/1 week off starting 10/07/20.  C1 given with Gemcitabine alone.  Abraxane added with cycle 2 and changed to every 2 weeks   01/06/2021 Imaging   CT CAP at [facility] Impression:  1.  Similar pancreatic head mass with unchanged associated vascular involvement and pancreatic ductal dilatation.  2.  Stable multifocal hypodense liver lesions, some of which are indeterminate. Consider MR liver with and without IV contrast for further evaluation.  3.  No evidence of new or increasing metastatic disease in the chest, abdomen or pelvis.    05/09/2021 Imaging   EXAM: CT CHEST, ABDOMEN, AND PELVIS WITH CONTRAST  IMPRESSION: Stable disease with stable heterogeneous mass within the pancreatic head. 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 There is, however, increasing soft tissue infiltration surrounding the superior mesenteric artery suspicious for regional infiltration of disease. No evidence of regional nodal or distant metastatic disease within  the chest, abdomen, and pelvis.   Pallidum biliary stenting of the extrahepatic bile duct with secondary pneumobilia again noted.   Multiple simple and minimally complex hepatic cysts identified. No focal intrahepatic masses noted.   Distal colonic diverticulosis without superimposed acute inflammatory change.   Aortic Atherosclerosis (ICD10-I70.0).   07/02/2021 Procedure   Upper EUS/EGD, Dr. Rush Landmark  Impression: EGD Impression: - No gross lesions in esophagus. Z-line irregular, 33 cm from the incisors. - 3 cm hiatal hernia. - Erythematous mucosa in the stomach. Biopsied. - Acquired duodenal stenosis. Unable to visualize ampullary orifice due to stenosis. Though adult endoscope can traverse the region.  EUS Impression: - A mass was identified in the pancreatic head. A tissue diagnosis was obtained prior to this exam. This is consistent with adenocarcinoma. Fiducial markers were deployed. - One stent was visualized endosonographically in the common bile duct. - Hyperechoic material consistent with sludge was visualized endosonographically in the gallbladder.   07/02/2021 Pathology Results   FINAL MICROSCOPIC DIAGNOSIS:   A. STOMACH, BIOPSY:  - Gastric antral and oxyntic mucosa with mild chronic gastritis  - Immunohistochemical stain for Helicobacter pylori is negative      INTERVAL HISTORY:  Tina Patel is here for a follow up of pancreatic cancer. She was last seen by me on 09/10/21. She presents to the clinic accompanied by her daughter Levada Dy. She reports she is feeling well overall. She notes she experienced a lot of diarrhea from the CT contrast.   All other systems were reviewed with the  patient and are negative.  MEDICAL HISTORY:  Past Medical History:  Diagnosis Date   Diabetes ([place])    Diabetes mellitus    Family history of colon cancer    Family history of lung cancer    Family history of multiple myeloma    Family history of prostate cancer    High  cholesterol    Hypertension    Hypothyroid     SURGICAL HISTORY: Past Surgical History:  Procedure Laterality Date   BILIARY BRUSHING  09/19/2020   Procedure: BILIARY BRUSHING;  Surgeon: [name], MD;  Location: [facility];  Service: Endoscopy;;   BILIARY STENT PLACEMENT N/A 09/19/2020   Procedure: BILIARY STENT PLACEMENT;  Surgeon: [name], MD;  Location: [facility];  Service: Endoscopy;  Laterality: N/A;   BIOPSY  07/02/2021   Procedure: BIOPSY;  Surgeon: Rush Landmark Telford Nab., MD;  Location: [place];  Service: Gastroenterology;;   ERCP N/A 09/19/2020   Procedure: ENDOSCOPIC RETROGRADE CHOLANGIOPANCREATOGRAPHY (ERCP);  Surgeon: [name], MD;  Location: [facility];  Service: Endoscopy;  Laterality: N/A;   ESOPHAGOGASTRODUODENOSCOPY (EGD) WITH PROPOFOL N/A 09/19/2020   Procedure: ESOPHAGOGASTRODUODENOSCOPY (EGD) WITH PROPOFOL;  Surgeon: [name], MD;  [facility]: [facility];  Service: Endoscopy;  Laterality: N/A;   ESOPHAGOGASTRODUODENOSCOPY (EGD) WITH PROPOFOL N/A 09/26/2020   Procedure: ESOPHAGOGASTRODUODENOSCOPY (EGD) WITH PROPOFOL;  Surgeon: [name], MD;  [facility];  Service: Endoscopy;  Laterality: N/A;   ESOPHAGOGASTRODUODENOSCOPY (EGD) WITH PROPOFOL N/A 07/02/2021   Procedure: ESOPHAGOGASTRODUODENOSCOPY (EGD) WITH PROPOFOL;  Surgeon: Rush Landmark Telford Nab., MD;  Location: [place];  Service: Gastroenterology;  Laterality: N/A;   EUS N/A 09/19/2020   Procedure: UPPER ENDOSCOPIC ULTRASOUND (EUS) LINEAR;  Surgeon: [name], MD;  Location: [facility];  Service: Endoscopy;  Laterality: N/A;   EUS N/A 07/02/2021   Procedure: UPPER ENDOSCOPIC ULTRASOUND (EUS) RADIAL;  Surgeon: Irving Copas., MD;  Location: [place];  Service: Gastroenterology;  Laterality: N/A;   FIDUCIAL MARKER PLACEMENT N/A 07/02/2021   Procedure: FIDUCIAL MARKER PLACEMENT;  Surgeon: Irving Copas., MD;  Location: [place];  Service:  Gastroenterology;  Laterality: N/A;   FINE NEEDLE ASPIRATION  09/19/2020   Procedure: FINE NEEDLE ASPIRATION;  Surgeon: [name], MD;  [facility];  Service: Endoscopy;;   FINE NEEDLE ASPIRATION N/A 09/26/2020   Procedure: FINE NEEDLE ASPIRATION (FNA) LINEAR;  Surgeon: [name], MD;  Location: [facility];  Service: Endoscopy;  Laterality: N/A;   FINGER SURGERY     IR IMAGING GUIDED PORT INSERTION  11/24/2020   SPHINCTEROTOMY  09/19/2020   Procedure: SPHINCTEROTOMY;  Surgeon: [name], MD;  Location: [facility];  Service: Endoscopy;;   UPPER ESOPHAGEAL ENDOSCOPIC ULTRASOUND (EUS) N/A 09/26/2020   Procedure: UPPER ESOPHAGEAL ENDOSCOPIC ULTRASOUND (EUS);  Surgeon: [name], MD;  Location[facility];  Service: Endoscopy;  Laterality: N/A;    I have reviewed the social history and family history with the patient and they are unchanged from previous note.  ALLERGIES:  has No Known Allergies.  MEDICATIONS:  Current Outpatient Medications  Medication Sig Dispense Refill   amLODipine (NORVASC) 5 MG tablet Take 5 mg by mouth daily.     blood glucose meter kit and supplies KIT Dispense based on patient and insurance preference. Use up to four times daily as directed. 1 each 0   furosemide (LASIX) 20 MG tablet Take 20 mg by mouth daily.     gabapentin (NEURONTIN) 100 MG capsule TAKE 1 TO 2 CAPSULES BY MOUTH at bedtime AS NEEDED (BOTTLE) 60  capsule 1   insulin glargine (LANTUS) 100 UNIT/ML Solostar Pen Inject 8 Units into the skin daily. (Patient taking differently: Inject 10 Units into the skin daily.) 15 mL 0   levothyroxine (SYNTHROID) 75 MCG tablet Take 75 mcg by mouth daily before breakfast.     lidocaine-prilocaine (EMLA) cream Apply 1 application topically as needed. 30 g 2   lisinopril (ZESTRIL) 10 MG tablet Take 10 mg by mouth daily.     mirtazapine (REMERON) 7.5 MG tablet TAKE 1 TABLET BY MOUTH EVERYDAY AT BEDTIME 90 tablet 1   ondansetron (ZOFRAN) 8 MG tablet  Take 1 tablet (8 mg total) by mouth 2 (two) times daily as needed (Nausea or vomiting). (Patient not taking: Reported on 06/29/2021) 30 tablet 1   OVER THE COUNTER MEDICATION Apply 1 application. topically daily as needed (Itching). Itching cream     pantoprazole (PROTONIX) 20 MG tablet Take 1 tablet (20 mg total) by mouth daily. 30 tablet 1   prochlorperazine (COMPAZINE) 10 MG tablet TAKE ONE TABLET BY MOUTH (10 mg) every 6 HOURS AS NEEDED (nausea or vomiting) 30 tablet 0   traMADol (ULTRAM) 50 MG tablet TAKE ONE TABLET BY MOUTH every 12 HOURS AS NEEDED 16 tablet 0   Vitamin D, Ergocalciferol, 50 MCG (2000 UT) CAPS Take 2,000 Units by mouth daily.     No current facility-administered medications for this visit.    PHYSICAL EXAMINATION: ECOG PERFORMANCE STATUS: 1 - Symptomatic but completely ambulatory  Vitals:   10/16/21 1303  BP: 136/77  Pulse: 86  Resp: 17  Temp: 98.6 F (37 C)  SpO2: 100%   Wt Readings from Last 3 Encounters:  10/16/21 133 lb 11.2 oz (60.6 kg)  09/10/21 129 lb 12.8 oz (58.9 kg)  08/03/21 128 lb 8 oz (58.3 kg)     GENERAL:alert, no distress and comfortable SKIN: skin color normal, no rashes or significant lesions EYES: normal, Conjunctiva are pink and non-injected, sclera clear  NEURO: alert & oriented x 3 with fluent speech  LABORATORY DATA:  I have reviewed the data as listed    Latest Ref Rng & Units 10/12/2021   11:23 AM 09/10/2021   11:38 AM 08/03/2021   11:18 AM  CBC  WBC 4.0 - 10.5 K/uL 4.9  4.5  3.9   Hemoglobin 12.0 - 15.0 g/dL 10.8  10.4  10.4   Hematocrit 36.0 - 46.0 % 33.1  31.6  32.5   Platelets 150 - 400 K/uL 195  233  237         Latest Ref Rng & Units 10/12/2021   11:23 AM 09/10/2021   11:38 AM 08/03/2021   11:18 AM  CMP  Glucose 70 - 99 mg/dL 69  167  75   BUN 8 - 23 mg/dL 13  15  13    Creatinine 0.44 - 1.00 mg/dL 0.80  0.81  0.75   Sodium 135 - 145 mmol/L 140  138  138   Potassium 3.5 - 5.1 mmol/L 3.9  4.3  4.1   Chloride 98 -  111 mmol/L 108  103  104   CO2 22 - 32 mmol/L 27  29  28    Calcium 8.9 - 10.3 mg/dL 9.0  9.0  9.1   Total Protein 6.5 - 8.1 g/dL 6.6  7.0  7.0   Total Bilirubin 0.3 - 1.2 mg/dL 0.5  0.4  0.3   Alkaline Phos 38 - 126 U/L 96  196  72   AST 15 - 41 U/L  19  29  15    ALT 0 - 44 U/L 15  32  8       RADIOGRAPHIC STUDIES: I have personally reviewed the radiological images as listed and agreed with the findings in the report. No results found.    No orders of the defined types were placed in this encounter.  All questions were answered. The patient knows to call the clinic with any problems, questions or concerns. No barriers to learning was detected. The total time spent in the appointment was 40 minutes.     [name], MD 10/16/2021   I, Wilburn Mylar, am acting as scribe for [name], MD.   I have reviewed the above documentation for accuracy and completeness, and I agree with the above.

## 2021-11-10 ENCOUNTER — Other Ambulatory Visit: Payer: Self-pay

## 2021-11-16 ENCOUNTER — Other Ambulatory Visit: Payer: Self-pay | Admitting: Hematology

## 2022-01-07 ENCOUNTER — Encounter (HOSPITAL_COMMUNITY): Payer: Self-pay

## 2022-01-07 ENCOUNTER — Emergency Department (HOSPITAL_COMMUNITY)

## 2022-01-07 ENCOUNTER — Inpatient Hospital Stay (HOSPITAL_COMMUNITY)
Admission: EM | Admit: 2022-01-07 | Discharge: 2022-01-08 | DRG: 951 | Disposition: A | Discharge status: Hospice | Attending: Internal Medicine | Admitting: Internal Medicine

## 2022-01-07 ENCOUNTER — Other Ambulatory Visit: Payer: Self-pay

## 2022-01-07 DIAGNOSIS — R6521 Severe sepsis with septic shock: Secondary | ICD-10-CM | POA: Diagnosis present

## 2022-01-07 DIAGNOSIS — Z66 Do not resuscitate: Secondary | ICD-10-CM | POA: Diagnosis present

## 2022-01-07 DIAGNOSIS — G9341 Metabolic encephalopathy: Secondary | ICD-10-CM | POA: Diagnosis present

## 2022-01-07 DIAGNOSIS — E119 Type 2 diabetes mellitus without complications: Secondary | ICD-10-CM

## 2022-01-07 DIAGNOSIS — C259 Malignant neoplasm of pancreas, unspecified: Secondary | ICD-10-CM | POA: Diagnosis present

## 2022-01-07 DIAGNOSIS — Z79899 Other long term (current) drug therapy: Secondary | ICD-10-CM | POA: Diagnosis not present

## 2022-01-07 DIAGNOSIS — D630 Anemia in neoplastic disease: Secondary | ICD-10-CM | POA: Diagnosis present

## 2022-01-07 DIAGNOSIS — G620 Drug-induced polyneuropathy: Secondary | ICD-10-CM | POA: Diagnosis present

## 2022-01-07 DIAGNOSIS — E876 Hypokalemia: Secondary | ICD-10-CM | POA: Diagnosis present

## 2022-01-07 DIAGNOSIS — R627 Adult failure to thrive: Secondary | ICD-10-CM

## 2022-01-07 DIAGNOSIS — R0689 Other abnormalities of breathing: Secondary | ICD-10-CM | POA: Diagnosis not present

## 2022-01-07 DIAGNOSIS — R404 Transient alteration of awareness: Secondary | ICD-10-CM | POA: Diagnosis not present

## 2022-01-07 DIAGNOSIS — A419 Sepsis, unspecified organism: Secondary | ICD-10-CM | POA: Diagnosis present

## 2022-01-07 DIAGNOSIS — D689 Coagulation defect, unspecified: Secondary | ICD-10-CM

## 2022-01-07 DIAGNOSIS — E872 Acidosis, unspecified: Secondary | ICD-10-CM

## 2022-01-07 DIAGNOSIS — Z20822 Contact with and (suspected) exposure to covid-19: Secondary | ICD-10-CM | POA: Diagnosis present

## 2022-01-07 DIAGNOSIS — E7800 Pure hypercholesterolemia, unspecified: Secondary | ICD-10-CM

## 2022-01-07 DIAGNOSIS — R4182 Altered mental status, unspecified: Secondary | ICD-10-CM | POA: Diagnosis present

## 2022-01-07 DIAGNOSIS — Z800 Family history of malignant neoplasm of digestive organs: Secondary | ICD-10-CM

## 2022-01-07 DIAGNOSIS — Z515 Encounter for palliative care: Secondary | ICD-10-CM | POA: Diagnosis not present

## 2022-01-07 DIAGNOSIS — Z801 Family history of malignant neoplasm of trachea, bronchus and lung: Secondary | ICD-10-CM

## 2022-01-07 DIAGNOSIS — Z9221 Personal history of antineoplastic chemotherapy: Secondary | ICD-10-CM

## 2022-01-07 DIAGNOSIS — T451X5A Adverse effect of antineoplastic and immunosuppressive drugs, initial encounter: Secondary | ICD-10-CM | POA: Diagnosis present

## 2022-01-07 DIAGNOSIS — R7401 Elevation of levels of liver transaminase levels: Secondary | ICD-10-CM

## 2022-01-07 DIAGNOSIS — I959 Hypotension, unspecified: Secondary | ICD-10-CM

## 2022-01-07 DIAGNOSIS — N179 Acute kidney failure, unspecified: Secondary | ICD-10-CM

## 2022-01-07 DIAGNOSIS — H547 Unspecified visual loss: Secondary | ICD-10-CM | POA: Diagnosis present

## 2022-01-07 DIAGNOSIS — A4159 Other Gram-negative sepsis: Secondary | ICD-10-CM | POA: Diagnosis present

## 2022-01-07 DIAGNOSIS — Z79890 Hormone replacement therapy (postmenopausal): Secondary | ICD-10-CM

## 2022-01-07 DIAGNOSIS — R410 Disorientation, unspecified: Secondary | ICD-10-CM | POA: Diagnosis not present

## 2022-01-07 DIAGNOSIS — Z6821 Body mass index (BMI) 21.0-21.9, adult: Secondary | ICD-10-CM

## 2022-01-07 DIAGNOSIS — Z807 Family history of other malignant neoplasms of lymphoid, hematopoietic and related tissues: Secondary | ICD-10-CM

## 2022-01-07 DIAGNOSIS — E039 Hypothyroidism, unspecified: Secondary | ICD-10-CM | POA: Diagnosis present

## 2022-01-07 DIAGNOSIS — Z794 Long term (current) use of insulin: Secondary | ICD-10-CM | POA: Diagnosis not present

## 2022-01-07 DIAGNOSIS — Z923 Personal history of irradiation: Secondary | ICD-10-CM

## 2022-01-07 DIAGNOSIS — I10 Essential (primary) hypertension: Secondary | ICD-10-CM | POA: Diagnosis present

## 2022-01-07 DIAGNOSIS — E43 Unspecified severe protein-calorie malnutrition: Secondary | ICD-10-CM | POA: Diagnosis present

## 2022-01-07 DIAGNOSIS — Z743 Need for continuous supervision: Secondary | ICD-10-CM | POA: Diagnosis not present

## 2022-01-07 DIAGNOSIS — D638 Anemia in other chronic diseases classified elsewhere: Secondary | ICD-10-CM

## 2022-01-07 DIAGNOSIS — I499 Cardiac arrhythmia, unspecified: Secondary | ICD-10-CM | POA: Diagnosis not present

## 2022-01-07 DIAGNOSIS — Z8042 Family history of malignant neoplasm of prostate: Secondary | ICD-10-CM

## 2022-01-07 DIAGNOSIS — A498 Other bacterial infections of unspecified site: Secondary | ICD-10-CM

## 2022-01-07 HISTORY — DX: Malignant neoplasm of pancreas, unspecified: C25.9

## 2022-01-07 LAB — COMPREHENSIVE METABOLIC PANEL
ALT: 22 U/L (ref 0–44)
AST: 43 U/L — ABNORMAL HIGH (ref 15–41)
Albumin: 2.2 g/dL — ABNORMAL LOW (ref 3.5–5.0)
Alkaline Phosphatase: 440 U/L — ABNORMAL HIGH (ref 38–126)
Anion gap: 23 — ABNORMAL HIGH (ref 5–15)
BUN: 42 mg/dL — ABNORMAL HIGH (ref 8–23)
CO2: 24 mmol/L (ref 22–32)
Calcium: 8.1 mg/dL — ABNORMAL LOW (ref 8.9–10.3)
Chloride: 93 mmol/L — ABNORMAL LOW (ref 98–111)
Creatinine, Ser: 1.89 mg/dL — ABNORMAL HIGH (ref 0.44–1.00)
GFR, Estimated: 26 mL/min — ABNORMAL LOW (ref >60)
Glucose, Bld: 157 mg/dL — ABNORMAL HIGH (ref 70–99)
Potassium: 2.9 mmol/L — ABNORMAL LOW (ref 3.5–5.1)
Sodium: 140 mmol/L (ref 135–145)
Total Bilirubin: 1.6 mg/dL — ABNORMAL HIGH (ref 0.3–1.2)
Total Protein: 6 g/dL — ABNORMAL LOW (ref 6.5–8.1)

## 2022-01-07 LAB — CBC WITH DIFFERENTIAL/PLATELET
Abs Immature Granulocytes: 0 10*3/uL (ref 0.00–0.07)
Band Neutrophils: 7 %
Basophils Absolute: 0 10*3/uL (ref 0.0–0.1)
Basophils Relative: 0 %
Eosinophils Absolute: 0 10*3/uL (ref 0.0–0.5)
Eosinophils Relative: 0 %
HCT: 26 % — ABNORMAL LOW (ref 36.0–46.0)
Hemoglobin: 7.9 g/dL — ABNORMAL LOW (ref 12.0–15.0)
Lymphocytes Relative: 1 %
Lymphs Abs: 0.1 10*3/uL — ABNORMAL LOW (ref 0.7–4.0)
MCH: 26.8 pg (ref 26.0–34.0)
MCHC: 30.4 g/dL (ref 30.0–36.0)
MCV: 88.1 fL (ref 80.0–100.0)
Monocytes Absolute: 0.2 10*3/uL (ref 0.1–1.0)
Monocytes Relative: 3 %
Neutro Abs: 5.3 10*3/uL (ref 1.7–7.7)
Neutrophils Relative %: 89 %
Platelets: 237 10*3/uL (ref 150–400)
RBC: 2.95 MIL/uL — ABNORMAL LOW (ref 3.87–5.11)
RDW: 14.1 % (ref 11.5–15.5)
WBC: 5.5 10*3/uL (ref 4.0–10.5)
nRBC: 0.5 % — ABNORMAL HIGH (ref 0.0–0.2)

## 2022-01-07 LAB — APTT: aPTT: 27 seconds (ref 24–36)

## 2022-01-07 LAB — PROTIME-INR
INR: 1.5 — ABNORMAL HIGH (ref 0.8–1.2)
Prothrombin Time: 17.6 seconds — ABNORMAL HIGH (ref 11.4–15.2)

## 2022-01-07 LAB — RESP PANEL BY RT-PCR (FLU A&B, COVID) ARPGX2
Influenza A by PCR: NEGATIVE
Influenza B by PCR: NEGATIVE
SARS Coronavirus 2 by RT PCR: NEGATIVE

## 2022-01-07 LAB — LACTIC ACID, PLASMA: Lactic Acid, Venous: 8.9 mmol/L (ref 0.5–1.9)

## 2022-01-07 MED ORDER — LACTATED RINGERS IV SOLN: INTRAVENOUS | Status: DC

## 2022-01-07 MED ORDER — ACETAMINOPHEN 650 MG RE SUPP: 650.0000 mg | Freq: Four times a day (QID) | RECTAL | Status: DC | PRN

## 2022-01-07 MED ORDER — HALOPERIDOL LACTATE 5 MG/ML IJ SOLN: 0.5000 mg | INTRAMUSCULAR | Status: DC | PRN

## 2022-01-07 MED ORDER — ACETAMINOPHEN 650 MG RE SUPP
650.0000 mg | Freq: Once | RECTAL | Status: AC
  Administered 2022-01-07: 650 mg via RECTAL
  Filled 2022-01-07: qty 1

## 2022-01-07 MED ORDER — BIOTENE DRY MOUTH MT LIQD: 15.0000 mL | OROMUCOSAL | Status: DC | PRN

## 2022-01-07 MED ORDER — HALOPERIDOL 0.5 MG PO TABS: 0.5000 mg | ORAL_TABLET | ORAL | Status: DC | PRN

## 2022-01-07 MED ORDER — LACTATED RINGERS IV BOLUS (SEPSIS)
1000.0000 mL | Freq: Once | INTRAVENOUS | Status: AC
  Administered 2022-01-07: 1000 mL via INTRAVENOUS

## 2022-01-07 MED ORDER — GLYCOPYRROLATE 0.2 MG/ML IJ SOLN: 0.2000 mg | INTRAMUSCULAR | Status: DC | PRN

## 2022-01-07 MED ORDER — POLYVINYL ALCOHOL 1.4 % OP SOLN: 1.0000 [drp] | Freq: Four times a day (QID) | OPHTHALMIC | Status: DC | PRN

## 2022-01-07 MED ORDER — GLYCOPYRROLATE 1 MG PO TABS: 1.0000 mg | ORAL_TABLET | ORAL | Status: DC | PRN

## 2022-01-07 MED ORDER — ONDANSETRON 4 MG PO TBDP: 4.0000 mg | ORAL_TABLET | Freq: Four times a day (QID) | ORAL | Status: DC | PRN

## 2022-01-07 MED ORDER — MORPHINE SULFATE (PF) 2 MG/ML IV SOLN
1.0000 mg | INTRAVENOUS | Status: DC | PRN
  Administered 2022-01-08: 1 mg via INTRAVENOUS
  Filled 2022-01-07: qty 1

## 2022-01-07 MED ORDER — ACETAMINOPHEN 325 MG PO TABS: 650.0000 mg | ORAL_TABLET | Freq: Four times a day (QID) | ORAL | Status: DC | PRN

## 2022-01-07 MED ORDER — ONDANSETRON HCL 4 MG/2ML IJ SOLN: 4.0000 mg | Freq: Four times a day (QID) | INTRAMUSCULAR | Status: DC | PRN

## 2022-01-07 MED ORDER — HALOPERIDOL LACTATE 2 MG/ML PO CONC: 0.5000 mg | ORAL | Status: DC | PRN

## 2022-01-07 NOTE — Progress Notes (Signed)
Basic assessment done, DNR band placed, family not at bedside so unable to do full admission assessment

## 2022-01-07 NOTE — ED Provider Notes (Signed)
[facility] DEPT Provider Note   CSN: [id number] Arrival date & time: 01/07/22  1241     History  Chief Complaint  Patient presents with   Altered Mental Status    Tina Patel is a 81 y.o. female.  HPI Presents via EMS from home with concern for altered mental status, fever.  Patient cannot provide any details, due to altered mental status level 5 caveat. Patient is under the care of hospice, with history of pancreatic cancer.  Per EMS the patient was found afebrile, listless at home, after several days of being less energetic than usual.  In route the patient was hypotensive, tachycardic and febrile.     Home Medications Prior to Admission medications   Medication Sig Start Date End Date Taking? Authorizing Provider  amLODipine (NORVASC) 5 MG tablet Take 5 mg by mouth daily. 07/02/20   [provider]  blood glucose meter kit and supplies KIT Dispense based on patient and insurance preference. Use up to four times daily as directed. 09/22/20   Elodia Florence., MD  furosemide (LASIX) 20 MG tablet Take 20 mg by mouth daily. 08/14/20   [provider]  gabapentin (NEURONTIN) 100 MG capsule TAKE 1 TO 2 CAPSULES BY MOUTH at bedtime AS NEEDED (BOTTLE) 11/16/21   [name], MD  insulin glargine (LANTUS) 100 UNIT/ML Solostar Pen Inject 8 Units into the skin daily. Patient taking differently: Inject 10 Units into the skin daily. 09/22/20   Elodia Florence., MD  levothyroxine (SYNTHROID) 75 MCG tablet Take 75 mcg by mouth daily before breakfast.    [provider]  lidocaine-prilocaine (EMLA) cream Apply 1 application topically as needed. 12/08/20   [name], MD  lisinopril (ZESTRIL) 10 MG tablet Take 10 mg by mouth daily.    [provider]  mirtazapine (REMERON) 7.5 MG tablet TAKE 1 TABLET BY MOUTH EVERYDAY AT BEDTIME 02/03/21   Alla Feeling, NP  ondansetron (ZOFRAN) 8 MG tablet Take 1 tablet (8 mg total) by  mouth 2 (two) times daily as needed (Nausea or vomiting). Patient not taking: Reported on 06/29/2021 10/03/20   [name], MD  OVER THE COUNTER MEDICATION Apply 1 application. topically daily as needed (Itching). Itching cream    [provider]  pantoprazole (PROTONIX) 20 MG tablet Take 1 tablet (20 mg total) by mouth daily. 02/16/21   Alla Feeling, NP  prochlorperazine (COMPAZINE) 10 MG tablet TAKE ONE TABLET BY MOUTH (10 mg) every 6 HOURS AS NEEDED (nausea or vomiting) 06/16/21   [name], MD  traMADol (ULTRAM) 50 MG tablet TAKE ONE TABLET BY MOUTH every 12 HOURS AS NEEDED 07/27/21   [name], MD  Vitamin D, Ergocalciferol, 50 MCG (2000 UT) CAPS Take 2,000 Units by mouth daily.    [provider]      Allergies    Patient has no known allergies.    Review of Systems   Review of Systems  Unable to perform ROS: Acuity of condition    Physical Exam Updated Vital Signs BP (!) 82/45   Pulse (!) 146   Temp (!) 101.2 F (38.4 C) (Rectal)   Resp (!) 21   Ht 5' 6"  (1.676 m)   Wt 60.3 kg   SpO2 94%   BMI 21.47 kg/m  Physical Exam Vitals and nursing note reviewed.  Constitutional:      Appearance: She is well-developed. She is ill-appearing.     Comments: Ill-appearing elderly female, moving  all extremities spontaneously, answering some questions briefly, though inconsistently  HENT:     Head: Normocephalic and atraumatic.  Eyes:     Conjunctiva/sclera: Conjunctivae normal.  Cardiovascular:     Rate and Rhythm: Regular rhythm. Tachycardia present.  Pulmonary:     Effort: Pulmonary effort is normal. Tachypnea present.     Breath sounds: Normal breath sounds. No stridor.  Abdominal:     General: There is no distension.  Skin:    General: Skin is warm and dry.  Neurological:     Mental Status: She is alert.     Cranial Nerves: No cranial nerve deficit.     Motor: Atrophy present.     Comments: Incomplete neuro exam  Psychiatric:        Mood and Affect:  Mood normal.        Cognition and Memory: Cognition is impaired. Memory is impaired.     ED Results / Procedures / Treatments   Labs (all labs ordered are listed, but only abnormal results are displayed) Labs Reviewed  LACTIC ACID, PLASMA - Abnormal; Notable for the following components:      Result Value   Lactic Acid, Venous 8.9 (*)    All other components within normal limits  COMPREHENSIVE METABOLIC PANEL - Abnormal; Notable for the following components:   Potassium 2.9 (*)    Chloride 93 (*)    Glucose, Bld 157 (*)    BUN 42 (*)    Creatinine, Ser 1.89 (*)    Calcium 8.1 (*)    Total Protein 6.0 (*)    Albumin 2.2 (*)    AST 43 (*)    Alkaline Phosphatase 440 (*)    Total Bilirubin 1.6 (*)    GFR, Estimated 26 (*)    Anion gap 23 (*)    All other components within normal limits  CBC WITH DIFFERENTIAL/PLATELET - Abnormal; Notable for the following components:   RBC 2.95 (*)    Hemoglobin 7.9 (*)    HCT 26.0 (*)    nRBC 0.5 (*)    Lymphs Abs 0.1 (*)    All other components within normal limits  PROTIME-INR - Abnormal; Notable for the following components:   Prothrombin Time 17.6 (*)    INR 1.5 (*)    All other components within normal limits  RESP PANEL BY RT-PCR (FLU A&B, COVID) [id number]  CULTURE, BLOOD (ROUTINE X 2)  CULTURE, BLOOD (ROUTINE X 2)  APTT  LACTIC ACID, PLASMA  URINALYSIS, ROUTINE W REFLEX MICROSCOPIC    EKG None  Radiology DG Chest Port 1 View  Result Date: 01/07/2022 CLINICAL DATA:  Questionable sepsis - evaluate for abnormality EXAM: PORTABLE CHEST 1 VIEW COMPARISON:  Chest x-ray February 09, 2021. FINDINGS: The heart size and mediastinal contours are within normal limits. Both lungs are clear. No visible pleural effusions or pneumothorax. No acute osseous abnormality. Right IJ approach Port-A-Cath with the tip projecting at the superior cavoatrial junction. IMPRESSION: No active disease. Electronically Signed   By: Margaretha Sheffield M.D.   On:  01/07/2022 13:56    Procedures Procedures    Medications Ordered in ED Medications  lactated ringers infusion ( Intravenous New Bag/Given 01/07/22 1338)  acetaminophen (TYLENOL) tablet 650 mg (has no administration in time range)    Or  acetaminophen (TYLENOL) suppository 650 mg (has no administration in time range)  haloperidol (HALDOL) tablet 0.5 mg (has no administration in time range)    Or  haloperidol (HALDOL) 2 MG/ML solution 0.5 mg (  has no administration in time range)    Or  haloperidol lactate (HALDOL) injection 0.5 mg (has no administration in time range)  ondansetron (ZOFRAN-ODT) disintegrating tablet 4 mg (has no administration in time range)    Or  ondansetron (ZOFRAN) injection 4 mg (has no administration in time range)  glycopyrrolate (ROBINUL) tablet 1 mg (has no administration in time range)    Or  glycopyrrolate (ROBINUL) injection 0.2 mg (has no administration in time range)    Or  glycopyrrolate (ROBINUL) injection 0.2 mg (has no administration in time range)  antiseptic oral rinse (BIOTENE) solution 15 mL (has no administration in time range)  polyvinyl alcohol (LIQUIFILM TEARS) 1.4 % ophthalmic solution 1 drop (has no administration in time range)  morphine (PF) 2 MG/ML injection 1 mg (has no administration in time range)  lactated ringers bolus 1,000 mL (0 mLs Intravenous Stopped 01/07/22 1435)    And  lactated ringers bolus 1,000 mL (1,000 mLs Intravenous New Bag/Given 01/07/22 1338)  acetaminophen (TYLENOL) suppository 650 mg (650 mg Rectal Given 01/07/22 1335)    ED Course/ Medical Decision Making/ A&P This patient with a Hx of pancreatic cancer, on hospice care presents to the ED for concern of fever, listlessness, this involves an extensive number of treatment options, and is a complaint that carries with it a high risk of complications and morbidity.    The differential diagnosis includes progression of disease, bacteremia, sepsis   Social  Determinants of Health:  Advanced age, pancreatic cancer  Additional history obtained:  Additional history and/or information obtained from EMS, chart review, notable for details included above, chart review notable for ongoing care with oncology for pancreatic cancer   After the initial evaluation, orders, including: Fluids x-ray labs were initiated.   Patient placed on Cardiac and Pulse-Oximetry Monitors. The patient was maintained on a cardiac monitor.  The cardiac monitored showed an rhythm of 120 down from 150 initially, sinus tach abnormal The patient was also maintained on pulse oximetry. The readings were typically 97% room air normal   On repeat evaluation of the patient stayed the same  Lab Tests:  I personally interpreted labs.  The pertinent results include: Severe lactic acidosis, acute kidney injury hypokalemia, elevated alk phos, all consistent with critical illness  Imaging Studies ordered:  I independently visualized and interpreted imaging which showed unremarkable chest x-ray I agree with the radiologist interpretation  Consultations Obtained:  I requested consultation with the hospice coordinator, palliative care coordinator,  and discussed lab and imaging findings as well as pertinent plan - they recommend: End-of-life comfort measures, we are starting the process to work on transition to [place] / Final MDM:  After consideration of the diagnostic results and the patient's response to treatment, this adult female with known pancreatic cancer, now on hospice care presents with listlessness.  Differential including sepsis versus progression of disease considered, discussed at length with family members.  Initial labs all consistent with critical illness, particularly lactic acidosis, acute kidney injury, and after conversations with family, hospice, patient was placed on comfort measures, with anticipated discharge to [facility]home  hospice.  Final Clinical Impression(s) / ED Diagnoses Final diagnoses:  Septic shock ([place])     Carmin Muskrat, MD 01/07/22 1623

## 2022-01-07 NOTE — ED Notes (Signed)
ED TO INPATIENT HANDOFF REPORT  ED Nurse Name and Phone #: Baxter Flattery, RN  S Name/Age/Gender Tina Patel 81 y.o. female Room/Bed: WA01/WA01  Code Status   Code Status: Prior  Home/SNF/Other Home Patient oriented to: self Is this baseline? No   Triage Complete: Triage complete  Chief Complaint Septic shock ([place]) [A41.9, R65.21]  Triage Note [facility] reports pt coming from home. Pt is DNR and on hospice at  home. Hospice came out and assessed pt and advised to call 911. Family reports decreased LOC and AMS all day today. Past week she has been decreased with her ADLs. Pancreatic CA diagnosis.   Allergies No Known Allergies  Level of Care/Admitting Diagnosis ED Disposition     ED Disposition  Admit   Condition  --   Comment  Hospital Area: [facility] [[id number]]  Level of Care: Palliative Care [15]  May admit patient to [facility] or [name] if equivalent level of care is available:: No  Covid Evaluation: Asymptomatic - no recent exposure (last 10 days) testing not required  Diagnosis: Septic shock [facility]) [[id number]]  Admitting Physician: Antonieta Pert [[id number]]  Attending Physician: Antonieta Pert [[id number]]  Certification:: I certify this patient will need inpatient services for at least 2 midnights  Estimated Length of Stay: 2          B Medical/Surgery History Past Medical History:  Diagnosis Date   Diabetes ([place])    Diabetes mellitus    Family history of colon cancer    Family history of lung cancer    Family history of multiple myeloma    Family history of prostate cancer    High cholesterol    Hypertension    Hypothyroid    Pancreatic cancer [facility])    Past Surgical History:  Procedure Laterality Date   BILIARY BRUSHING  09/19/2020   Procedure: BILIARY BRUSHING;  Surgeon: [name], MD;  [facility];  Service: Endoscopy;;   BILIARY STENT PLACEMENT N/A 09/19/2020   Procedure: BILIARY STENT PLACEMENT;  Surgeon: [name], MD;   Location: [facility];  Service: Endoscopy;  Laterality: N/A;   BIOPSY  07/02/2021   Procedure: BIOPSY;  Surgeon: Rush Landmark Telford Nab., MD;  Location: [facility];  Service: Gastroenterology;;   ERCP N/A 09/19/2020   Procedure: ENDOSCOPIC RETROGRADE CHOLANGIOPANCREATOGRAPHY (ERCP);  Surgeon: [name], MD;  Location[facility];  Service: Endoscopy;  Laterality: N/A;   ESOPHAGOGASTRODUODENOSCOPY (EGD) WITH PROPOFOL N/A 09/19/2020   Procedure: ESOPHAGOGASTRODUODENOSCOPY (EGD) WITH PROPOFOL;  Surgeon: [name], MD;  [facility];  Service: Endoscopy;  Laterality: N/A;   ESOPHAGOGASTRODUODENOSCOPY (EGD) WITH PROPOFOL N/A 09/26/2020   Procedure: ESOPHAGOGASTRODUODENOSCOPY (EGD) WITH PROPOFOL;  Surgeon: [name], MD;  [facility];  Service: Endoscopy;  Laterality: N/A;   ESOPHAGOGASTRODUODENOSCOPY (EGD) WITH PROPOFOL N/A 07/02/2021   Procedure: ESOPHAGOGASTRODUODENOSCOPY (EGD) WITH PROPOFOL;  Surgeon: Rush Landmark Telford Nab., MD;  Location: [facility];  Service: Gastroenterology;  Laterality: N/A;   EUS N/A 09/19/2020   Procedure: UPPER ENDOSCOPIC ULTRASOUND (EUS) LINEAR;  Surgeon: [name], MD;  [facility];  Service: Endoscopy;  Laterality: N/A;   EUS N/A 07/02/2021   Procedure: UPPER ENDOSCOPIC ULTRASOUND (EUS) RADIAL;  Surgeon: Irving Copas., MD;  Location: [facility];  Service: Gastroenterology;  Laterality: N/A;   FIDUCIAL MARKER PLACEMENT N/A 07/02/2021   Procedure: FIDUCIAL MARKER PLACEMENT;  Surgeon: Irving Copas., MD;  Location: [facility];  Service: Gastroenterology;  Laterality: N/A;   FINE NEEDLE ASPIRATION  09/19/2020   Procedure: FINE  NEEDLE ASPIRATION;  Surgeon: [name], MD;  Location[facility];  Service: Endoscopy;;   FINE NEEDLE ASPIRATION N/A 09/26/2020   Procedure: FINE NEEDLE ASPIRATION (FNA) LINEAR;  Surgeon: [name], MD;  [facility];  Service: Endoscopy;  Laterality: N/A;   FINGER  SURGERY     IR IMAGING GUIDED PORT INSERTION  11/24/2020   SPHINCTEROTOMY  09/19/2020   Procedure: SPHINCTEROTOMY;  Surgeon: [name], MD;  [facility];  Service: Endoscopy;;   UPPER ESOPHAGEAL ENDOSCOPIC ULTRASOUND (EUS) N/A 09/26/2020   Procedure: UPPER ESOPHAGEAL ENDOSCOPIC ULTRASOUND (EUS);  Surgeon: [name], MD;  Location: [facility];  Service: Endoscopy;  Laterality: N/A;     A IV Location/Drains/Wounds Patient Lines/Drains/Airways Status     Active Line/Drains/Airways     Name Placement date Placement time Site Days   Implanted Port 11/24/20 Right Chest 11/24/20  0950  Chest  409   Peripheral IV 01/07/22 24 G 1" Posterior;Right Hand 01/07/22  1247  Hand  less than 1   GI Stent 10 Fr. 09/19/20  1510  --  475            Intake/Output Last 24 hours No intake or output data in the 24 hours ending 01/07/22 1558  Labs/Imaging Results for orders placed or performed during the hospital encounter of 01/07/22 (from the past 48 hour(s))  Lactic acid, plasma     Status: Abnormal   Collection Time: 01/07/22  1:20 PM  Result Value Ref Range   Lactic Acid, Venous 8.9 (HH) 0.5 - 1.9 mmol/L    Comment: CRITICAL RESULT CALLED TO, READ BACK BY AND VERIFIED WITH T.Merlon Alcorta, RN AT 1430 ON 09.21.23 BY N.THOMPSON Performed at [facility], [place] [address] [place], [place] [address]   Comprehensive metabolic panel     Status: Abnormal   Collection Time: 01/07/22  1:20 PM  Result Value Ref Range   Sodium 140 135 - 145 mmol/L   Potassium 2.9 (L) 3.5 - 5.1 mmol/L   Chloride 93 (L) 98 - 111 mmol/L   CO2 24 22 - 32 mmol/L   Glucose, Bld 157 (H) 70 - 99 mg/dL    Comment: Glucose reference range applies only to samples taken after fasting for at least 8 hours.   BUN 42 (H) 8 - 23 mg/dL   Creatinine, Ser 1.89 (H) 0.44 - 1.00 mg/dL   Calcium 8.1 (L) 8.9 - 10.3 mg/dL   Total Protein 6.0 (L) 6.5 - 8.1 g/dL   Albumin 2.2 (L) 3.5 - 5.0 g/dL   AST 43 (H) 15  - 41 U/L   ALT 22 0 - 44 U/L    Comment: RESULT CONFIRMED BY MANUAL DILUTION   Alkaline Phosphatase 440 (H) 38 - 126 U/L   Total Bilirubin 1.6 (H) 0.3 - 1.2 mg/dL   GFR, Estimated 26 (L) >60 mL/min    Comment: (NOTE) Calculated using the CKD-EPI Creatinine Equation (2021)    Anion gap 23 (H) 5 - 15    Comment: Electrolytes repeated to confirm. Performed at [facility], [place] [address], [place], [place] [address]   CBC with Differential     Status: Abnormal   Collection Time: 01/07/22  1:20 PM  Result Value Ref Range   WBC 5.5 4.0 - 10.5 K/uL   RBC 2.95 (L) 3.87 - 5.11 MIL/uL   Hemoglobin 7.9 (L) 12.0 - 15.0 g/dL   HCT 26.0 (L) 36.0 - 46.0 %   MCV 88.1 80.0 - 100.0 fL  MCH 26.8 26.0 - 34.0 pg   MCHC 30.4 30.0 - 36.0 g/dL   RDW 14.1 11.5 - 15.5 %   Platelets 237 150 - 400 K/uL   nRBC 0.5 (H) 0.0 - 0.2 %   Neutrophils Relative % 89 %   Neutro Abs 5.3 1.7 - 7.7 K/uL   Band Neutrophils 7 %   Lymphocytes Relative 1 %   Lymphs Abs 0.1 (L) 0.7 - 4.0 K/uL   Monocytes Relative 3 %   Monocytes Absolute 0.2 0.1 - 1.0 K/uL   Eosinophils Relative 0 %   Eosinophils Absolute 0.0 0.0 - 0.5 K/uL   Basophils Relative 0 %   Basophils Absolute 0.0 0.0 - 0.1 K/uL   WBC Morphology MILD LEFT SHIFT (1-5% METAS, OCC MYELO, OCC BANDS)    Abs Immature Granulocytes 0.00 0.00 - 0.07 K/uL    Comment: Performed at [facility], [place] [address] [place], [place] [address]  Protime-INR     Status: Abnormal   Collection Time: 01/07/22  1:20 PM  Result Value Ref Range   Prothrombin Time 17.6 (H) 11.4 - 15.2 seconds   INR 1.5 (H) 0.8 - 1.2    Comment: (NOTE) INR goal varies based on device and disease states. Performed at [facility], [place] [address], [place], [place] [address]   APTT     Status: None   Collection Time: 01/07/22  1:20 PM  Result Value Ref Range   aPTT 27 24 - 36 seconds    Comment: Performed at [facility],  [place] [address] [place], [place] [address]  Resp Panel by RT-PCR (Flu A&B, Covid) Anterior Nasal Swab     Status: None   Collection Time: 01/07/22  1:30 PM   Specimen: Anterior Nasal Swab  Result Value Ref Range   SARS Coronavirus 2 by RT PCR NEGATIVE NEGATIVE    Comment: (NOTE) SARS-CoV-2 target nucleic acids are NOT DETECTED.  The SARS-CoV-2 RNA is generally detectable in upper respiratory specimens during the acute phase of infection. The lowest concentration of SARS-CoV-2 viral copies this assay can detect is 138 copies/mL. A negative result does not preclude SARS-Cov-2 infection and should not be used as the sole basis for treatment or other patient management decisions. A negative result may occur with  improper specimen collection/handling, submission of specimen other than nasopharyngeal swab, presence of viral mutation(s) within the areas targeted by this assay, and inadequate number of viral copies(<138 copies/mL). A negative result must be combined with clinical observations, patient history, and epidemiological information. The expected result is Negative.  Fact Sheet for Patients:  EntrepreneurPulse.com.au  Fact Sheet for Healthcare Providers:  IncredibleEmployment.be  This test is no t yet approved or cleared by the [place] FDA and  has been authorized for detection and/or diagnosis of SARS-CoV-2 by FDA under an Emergency Use Authorization (EUA). This EUA will remain  in effect (meaning this test can be used) for the duration of the COVID-19 declaration under Section 564(b)(1) of the Act, 21 U.S.C.section 360bbb-3(b)(1), unless the authorization is terminated  or revoked sooner.       Influenza A by PCR NEGATIVE NEGATIVE   Influenza B by PCR NEGATIVE NEGATIVE    Comment: (NOTE) The Xpert Xpress SARS-CoV-2/FLU/RSV plus assay is intended as an aid in the diagnosis of influenza from Nasopharyngeal swab specimens  and should not be used as a sole basis for treatment. Nasal washings and aspirates are unacceptable for Xpert Xpress SARS-CoV-2/FLU/RSV testing.  Fact Sheet for Patients: EntrepreneurPulse.com.au  Fact Sheet for Healthcare Providers: IncredibleEmployment.be  This test is not yet approved or cleared by the [place] FDA and has been authorized for detection and/or diagnosis of SARS-CoV-2 by FDA under an Emergency Use Authorization (EUA). This EUA will remain in effect (meaning this test can be used) for the duration of the COVID-19 declaration under Section 564(b)(1) of the Act, 21 U.S.C. section 360bbb-3(b)(1), unless the authorization is terminated or revoked.  Performed at [facility], [place] [address], [place], [place] [address]    DG Chest Port 1 View  Result Date: 01/07/2022 CLINICAL DATA:  Questionable sepsis - evaluate for abnormality EXAM: PORTABLE CHEST 1 VIEW COMPARISON:  Chest x-ray February 09, 2021. FINDINGS: The heart size and mediastinal contours are within normal limits. Both lungs are clear. No visible pleural effusions or pneumothorax. No acute osseous abnormality. Right IJ approach Port-A-Cath with the tip projecting at the superior cavoatrial junction. IMPRESSION: No active disease. Electronically Signed   By: Margaretha Sheffield M.D.   On: 01/07/2022 13:56    Pending Labs Unresulted Labs (From admission, onward)     Start     Ordered   01/07/22 1323  Lactic acid, plasma  (Septic presentation on arrival (screening labs, nursing and treatment orders for obvious sepsis))  STAT Now then every 2 hours,   R (with STAT occurrences)      01/07/22 1324   01/07/22 1323  Blood Culture (routine x 2)  (Septic presentation on arrival (screening labs, nursing and treatment orders for obvious sepsis))  BLOOD CULTURE X 2,   STAT      01/07/22 1324   01/07/22 1323  Urinalysis, Routine w reflex microscopic  (Septic  presentation on arrival (screening labs, nursing and treatment orders for obvious sepsis))  ONCE - URGENT,   URGENT        01/07/22 1324            Vitals/Pain Today's Vitals   01/07/22 1330 01/07/22 1400 01/07/22 1430 01/07/22 1519  BP: (!) 73/44 (!) 87/50 (!) 82/45   Pulse: (!) 146     Resp: 20 (!) 21 (!) 21   Temp:    (!) 101.2 F (38.4 C)  TempSrc:    Rectal  SpO2: 94%     Weight:      Height:        Isolation Precautions No active isolations  Medications Medications  lactated ringers infusion ( Intravenous New Bag/Given 01/07/22 1338)  lactated ringers bolus 1,000 mL (0 mLs Intravenous Stopped 01/07/22 1435)    And  lactated ringers bolus 1,000 mL (1,000 mLs Intravenous New Bag/Given 01/07/22 1338)  acetaminophen (TYLENOL) suppository 650 mg (650 mg Rectal Given 01/07/22 1335)    Mobility walks High fall risk   Focused Assessments Cardiac Assessment Handoff:    No results found for: "CKTOTAL", "CKMB", "CKMBINDEX", "TROPONINI" No results found for: "DDIMER" Does the Patient currently have chest pain? No    R Recommendations: See Admitting Provider Note  Report given to:   Additional Notes:

## 2022-01-07 NOTE — ED Notes (Signed)
Pt family called and updated on pt room number 1605

## 2022-01-07 NOTE — H&P (Addendum)
History and Physical    SILVA AAMODT SVX:[id number] DOB: June 08, 1940 DOA: 01/07/2022  PCP: [name], DO  Patient coming from: home Chief Complaint  Patient presents with   Altered Mental Status  HPI: History of pancreatic adenocarcinoma diagnosed June/2022 has had chemo, SBRT and currently under hospice care, diabetes, hyperlipidemia, hypothyroidism, diabetes mellitus, peripheral neuropathy due to chemotherapy who has had decreased vision, confusion and decreased ADL dysfunction and sent to the hospital due to worsening of her mental status. She was hypotensive in route, in the ED hypotensive up to 64/45 febrile 103.1, on vent initial work-up that showed hypokalemia 2.9, acute renal failure 1.8, metabolic acidosis, transaminitis, lactic acidosis 8.9 anemia 7.9 g, coagulopathy with INR 1.5 radiology COVID-negative, chest x-ray no active disease. Patient was diagnosed with septic shock, acute renal failure-initially started on fluid resuscitation but after subsequent discussion the family given patient's hospice status with pancreatic cancer being admitted for comfort measures. On my exam,she is resting,able to open her eyes but unable to communicate interact, goes back to sleep quickly.  Her blood pressure is in 70s.  unable to obtain full history.  Call her daughter and obtain rest of the history, given her terminal condition they prefer her to be in comfort measures.  Daughter and patient's husband will be visiting again later tonight.   Assessment/Plan Principal Problem:   Hypotension Active Problems:   DM (diabetes mellitus), type 2 (HCC)   HTN (hypertension)   Hypothyroidism   Pancreatic cancer (HCC)   High cholesterol   Acute renal failure (ARF) (HCC)   Lactic acidosis   Hypokalemia   Transaminitis   Protein-calorie malnutrition, severe (HCC)   Anemia of chronic disease   Coagulopathy (HCC)   Acute metabolic encephalopathy   Failure to thrive in adult  End-of-life care:  Patient is actively dying, has a terminal diagnosis of pancreatic cancer now with acute metabolic encephalopathy, hypotension, lactic acidosis, hypokalemia, severe protein malnutrition,FTT.As per wishes of patient/daughter and husband comfort care measures has been initiated in the ED, will admit the patient to focus on providing comfort.  Palliative care has been consulted.  Plan for [facility] if she survives. I spoke with patient's daughter over the phone regarding overall prognosis, sepsis her to keep her comfortable.  she along with her father will plan to visit the patient later tonight.    Other issues as listed above.   Body mass index is 21.47 kg/m.   Severity of Illness: The appropriate patient status for this patient is INPATIENT. Inpatient status is judged to be reasonable and necessary in order to provide the required intensity of service to ensure the patient's safety. The patient's presenting symptoms, physical exam findings, and initial radiographic and laboratory data in the context of their chronic comorbidities is felt to place them at high risk for further clinical deterioration. Furthermore, it is not anticipated that the patient will be medically stable for discharge from the hospital within 2 midnights of admission.   * I certify that at the point of admission it is my clinical judgment that the patient will require inpatient hospital care spanning beyond 2 midnights from the point of admission due to high intensity of service, high risk for further deterioration and high frequency of surveillance required.*   DVT prophylaxis: none for comdort Code Status:   Code Status: DNR  Family Communication: Admission, patients condition and plan of care including tests being ordered have been discussed with the patient's daughter who indicate understanding and agree with the  plan and Code Status.  Consults called:  PMT  Review of Systems: Unable to obtain review of system given  patient's mental status   Past Medical History:  Diagnosis Date   Diabetes ([place])    Diabetes mellitus    Family history of colon cancer    Family history of lung cancer    Family history of multiple myeloma    Family history of prostate cancer    High cholesterol    Hypertension    Hypothyroid    Pancreatic cancer [facility])     Past Surgical History:  Procedure Laterality Date   BILIARY BRUSHING  09/19/2020   Procedure: BILIARY BRUSHING;  Surgeon: [name], MD;  Location: [facility];  Service: Endoscopy;;   BILIARY STENT PLACEMENT N/A 09/19/2020   Procedure: BILIARY STENT PLACEMENT;  Surgeon: [name], MD;  Location: [facility];  Service: Endoscopy;  Laterality: N/A;   BIOPSY  07/02/2021   Procedure: BIOPSY;  Surgeon: Rush Landmark Telford Nab., MD;  Location: [place];  Service: Gastroenterology;;   ERCP N/A 09/19/2020   Procedure: ENDOSCOPIC RETROGRADE CHOLANGIOPANCREATOGRAPHY (ERCP);  Surgeon: [name], MD;  Location: [facility];  Service: Endoscopy;  Laterality: N/A;   ESOPHAGOGASTRODUODENOSCOPY (EGD) WITH PROPOFOL N/A 09/19/2020   Procedure: ESOPHAGOGASTRODUODENOSCOPY (EGD) WITH PROPOFOL;  Surgeon: [name], MD;  Location: [facility];  Service: Endoscopy;  Laterality: N/A;   ESOPHAGOGASTRODUODENOSCOPY (EGD) WITH PROPOFOL N/A 09/26/2020   Procedure: ESOPHAGOGASTRODUODENOSCOPY (EGD) WITH PROPOFOL;  Surgeon: [name], MD;  [facility];  Service: Endoscopy;  Laterality: N/A;   ESOPHAGOGASTRODUODENOSCOPY (EGD) WITH PROPOFOL N/A 07/02/2021   Procedure: ESOPHAGOGASTRODUODENOSCOPY (EGD) WITH PROPOFOL;  Surgeon: Rush Landmark Telford Nab., MD;  Location: [place];  Service: Gastroenterology;  Laterality: N/A;   EUS N/A 09/19/2020   Procedure: UPPER ENDOSCOPIC ULTRASOUND (EUS) LINEAR;  Surgeon: [name], MD;  Location: [facility];  Service: Endoscopy;  Laterality: N/A;   EUS N/A 07/02/2021   Procedure: UPPER ENDOSCOPIC ULTRASOUND (EUS) RADIAL;   Surgeon: Irving Copas., MD;  Location: [place];  Service: Gastroenterology;  Laterality: N/A;   FIDUCIAL MARKER PLACEMENT N/A 07/02/2021   Procedure: FIDUCIAL MARKER PLACEMENT;  Surgeon: Irving Copas., MD;  Location: [place];  Service: Gastroenterology;  Laterality: N/A;   FINE NEEDLE ASPIRATION  09/19/2020   Procedure: FINE NEEDLE ASPIRATION;  Surgeon: [name], MD;  [facility];  Service: Endoscopy;;   FINE NEEDLE ASPIRATION N/A 09/26/2020   Procedure: FINE NEEDLE ASPIRATION (FNA) LINEAR;  Surgeon: [name], MD;  Location[facility];  Service: Endoscopy;  Laterality: N/A;   FINGER SURGERY     IR IMAGING GUIDED PORT INSERTION  11/24/2020   SPHINCTEROTOMY  09/19/2020   Procedure: SPHINCTEROTOMY;  Surgeon: [name], MD;  Location: [facility];  Service: Endoscopy;;   UPPER ESOPHAGEAL ENDOSCOPIC ULTRASOUND (EUS) N/A 09/26/2020   Procedure: UPPER ESOPHAGEAL ENDOSCOPIC ULTRASOUND (EUS);  Surgeon: [name], MD;  Location: [facility];  Service: Endoscopy;  Laterality: N/A;     reports that she has never smoked. She has never used smokeless tobacco. She reports that she does not drink alcohol and does not use drugs.  No Known Allergies  Family History  Problem Relation Age of Onset   Cancer Mother        lung cancer   Prostate cancer Father    Multiple myeloma Sister    Prostate cancer Brother    Cancer - Colon Maternal Aunt    Lung cancer Maternal Uncle    Cancer Son 70  prostate cancer     Prior to Admission medications   Medication Sig Start Date End Date Taking? Authorizing Provider  amLODipine (NORVASC) 5 MG tablet Take 5 mg by mouth daily. 07/02/20   [provider]  blood glucose meter kit and supplies KIT Dispense based on patient and insurance preference. Use up to four times daily as directed. 09/22/20   Elodia Florence., MD  furosemide (LASIX) 20 MG tablet Take 20 mg by mouth daily. 08/14/20   [provider]  gabapentin (NEURONTIN) 100 MG capsule TAKE 1 TO 2 CAPSULES BY MOUTH at bedtime AS NEEDED (BOTTLE) 11/16/21   [name], MD  insulin glargine (LANTUS) 100 UNIT/ML Solostar Pen Inject 8 Units into the skin daily. Patient taking differently: Inject 10 Units into the skin daily. 09/22/20   Elodia Florence., MD  levothyroxine (SYNTHROID) 75 MCG tablet Take 75 mcg by mouth daily before breakfast.    [provider]  lidocaine-prilocaine (EMLA) cream Apply 1 application topically as needed. 12/08/20   [name], MD  lisinopril (ZESTRIL) 10 MG tablet Take 10 mg by mouth daily.    [provider]  mirtazapine (REMERON) 7.5 MG tablet TAKE 1 TABLET BY MOUTH EVERYDAY AT BEDTIME 02/03/21   Alla Feeling, NP  ondansetron (ZOFRAN) 8 MG tablet Take 1 tablet (8 mg total) by mouth 2 (two) times daily as needed (Nausea or vomiting). Patient not taking: Reported on 06/29/2021 10/03/20   [name], MD  OVER THE COUNTER MEDICATION Apply 1 application. topically daily as needed (Itching). Itching cream    [provider]  pantoprazole (PROTONIX) 20 MG tablet Take 1 tablet (20 mg total) by mouth daily. 02/16/21   Alla Feeling, NP  prochlorperazine (COMPAZINE) 10 MG tablet TAKE ONE TABLET BY MOUTH (10 mg) every 6 HOURS AS NEEDED (nausea or vomiting) 06/16/21   [name], MD  traMADol (ULTRAM) 50 MG tablet TAKE ONE TABLET BY MOUTH every 12 HOURS AS NEEDED 07/27/21   [name], MD  Vitamin D, Ergocalciferol, 50 MCG (2000 UT) CAPS Take 2,000 Units by mouth daily.    [provider]    Physical Exam: Vitals:   01/07/22 1500 01/07/22 1519 01/07/22 1530 01/07/22 1600  BP: (!) 84/43  (!) 79/49 (!) 71/44  Pulse:   (!) 119 (!) 120  Resp: 17  (!) 23 17  Temp:  (!) 101.2 F (38.4 C)    TempSrc:  Rectal    SpO2:   98% 96%  Weight:      Height:       General exam: Lethargic, able to briefly open her eyes,  HEENT:Oral mucosa dry, Ear/Nose WNL grossly, dentition  normal. Respiratory system: bilaterally clear,no wheezing or crackles,no use of accessory muscle Cardiovascular system: S1 & S2 +, No JVD,. Gastrointestinal system: Abdomen soft, NT,ND, BS+ Nervous System: Awake able to briefly open her eyes, Extremities: No edema, distal peripheral pulses palpable.  Skin: No rashes,no icterus. MSK: thin muscle bulk,tone, power   Labs on Admission: I have personally reviewed following labs and imaging studies  CBC: Recent Labs  Lab 01/07/22 1320  WBC 5.5  NEUTROABS 5.3  HGB 7.9*  HCT 26.0*  MCV 88.1  PLT 485   Basic Metabolic Panel: Recent Labs  Lab 01/07/22 1320  NA 140  K 2.9*  CL 93*  CO2 24  GLUCOSE 157*  BUN 42*  CREATININE 1.89*  CALCIUM 8.1*   GFR: Estimated Creatinine Clearance: 21.9 mL/min (A) (by  C-G formula based on SCr of 1.89 mg/dL (H)). Liver Function Tests: Recent Labs  Lab 01/07/22 1320  AST 43*  ALT 22  ALKPHOS 440*  BILITOT 1.6*  PROT 6.0*  ALBUMIN 2.2*  Coagulation Profile: Recent Labs  Lab 01/07/22 1320  INR 1.5*  Radiological Exams on Admission: DG Chest Port 1 View  Result Date: 01/07/2022 CLINICAL DATA:  Questionable sepsis - evaluate for abnormality EXAM: PORTABLE CHEST 1 VIEW COMPARISON:  Chest x-ray February 09, 2021. FINDINGS: The heart size and mediastinal contours are within normal limits. Both lungs are clear. No visible pleural effusions or pneumothorax. No acute osseous abnormality. Right IJ approach Port-A-Cath with the tip projecting at the superior cavoatrial junction. IMPRESSION: No active disease. Electronically Signed   By: Margaretha Sheffield M.D.   On: 01/07/2022 13:56   Antonieta Pert MD [facility]  If 7PM-7AM, please contact night-coverage [email or web address]  01/07/2022, 5:08 PM

## 2022-01-07 NOTE — ED Triage Notes (Signed)
[facility] reports pt coming from home. Pt is DNR and on hospice at  home. Hospice came out and assessed pt and advised to call 911. Family reports decreased LOC and AMS all day today. Past week she has been decreased with her ADLs. Pancreatic CA diagnosis.

## 2022-01-07 NOTE — Progress Notes (Signed)
[facility]) Hospital Liaison Note    Mrs. Stegemann is a current hospice patient with a terminal diagnosis of pancreatic cancer. ACC RN went to the home due to patient's altered mental status and lethargy that she has experienced for the past couple of weeks. Patient brought into the ED per the recommendation of the [facility] home care RN. Patient is hypotensive, tachycardiac, and febrile. ED doctor evaluated Mrs. Picking condition and labs, recommending full comfort measures and to be moved to [facility] for end of life care. was Mrs. Cisse was admitted to [facility] on 9.21 for end of life care until we can move her to [facility]. Per Dr.Feldmann, ACC MD, this is a related hospital admission.    Visited with patient and daughters at bedside. Patient was laying in bed, sleeping, unresponsive to voice. Talk to daughters about [facility], provided some information and answered questions. Dr. Damaris Schooner with family about patient's poor prognosis, and they decided to proceed with full comfort measures. Family is ok with her going to [facility] once bed is available tomorrow. Will follow up tomorrow morning.     Patient is inpatient appropriate due to requiring end of life care until she can be moved to [facility]   V/S:  57/33 bp,  98.2 Temp, 110 bpm,  16 RR, 92% on RA  I&O: Not recorded.  Labs:  Potassium: 2.9 (L) Chloride: 93 (L) Glucose: 157 (H) BUN: 42 (H) Creatinine: 1.89 (H) Calcium: 8.1 (L) Anion gap: 23 (H) Alkaline Phosphatase: 440 (H) Albumin: 2.2 (L) AST: 43 (H) Total Protein: 6.0 (L) Total Bilirubin: 1.6 (H) GFR, Estimated: 26 (L) Lactic Acid, Venous: 8.9 (HH) RBC: 2.95 (L) Hemoglobin: 7.9 (L) HCT: 26.0 (L) nRBC: 0.5 (H) Lymphocyte #: 0.1 (L) Prothrombin Time: 17.6 (H) INR: 1.5 (H)  Diagnostics:  CHEST 1 VIEW  FINDINGS: The heart size and mediastinal contours are within normal limits. Both lungs are clear. No visible pleural effusions or  pneumothorax. No acute osseous abnormality. Right IJ approach Port-A-Cath with the tip projecting at the superior cavoatrial junction.  IV/PRN:  lactated ringers bolus 1,000 mL  - once IV x2 lactated ringers infusion - 179m/hr, continuous, IV x1    Problem list: Assessment/Plan Principal Problem:   Hypotension Active Problems:   DM (diabetes mellitus), type 2 (HCC)   HTN (hypertension)   Hypothyroidism   Pancreatic cancer (HCC)   High cholesterol   Acute renal failure (ARF) (HCC)   Lactic acidosis   Hypokalemia   Transaminitis   Protein-calorie malnutrition, severe (HCC)   Anemia of chronic disease   Coagulopathy (HCC)   Acute metabolic encephalopathy   Failure to thrive in adult   End-of-life care: Patient is actively dying, has a terminal diagnosis of pancreatic cancer now with acute metabolic encephalopathy, hypotension, lactic acidosis, hypokalemia, severe protein malnutrition,FTT.As per wishes of patient/daughter and husband comfort care measures has been initiated in the ED, will admit the patient to focus on providing comfort.  Palliative care has been consulted.  Plan for [facility] if she survives. I spoke with patient's daughter over the phone regarding overall prognosis, sepsis her to keep her comfortable.  she along with her father will plan to visit the patient later tonight.      GOC: Patient is currently a DNR and has been transitioned to full comfort care.    [facility].   Family: Spoke with daughters present at bedside, answered  any questions and provided information about [facility].   IDT: Updated    Should patient need ambulance transfer - Please use [facility]) as they contract this service for our active hospice patients.      Zigmund Gottron, RN [facility] Liaison  [phone]

## 2022-01-07 NOTE — Hospital Course (Addendum)
History of pancreatic adenocarcinoma diagnosed June/2022 has had chemo, SBRT and currently under hospice care, diabetes, hyperlipidemia, hypothyroidism, diabetes mellitus, peripheral neuropathy due to chemotherapy who has had decreased vision, confusion and decreased ADL dysfunction and sent to the hospital due to worsening of her mental status. She was hypotensive in route, in the ED hypotensive up to 64/45 febrile 103.1, on vent initial work-up that showed hypokalemia 2.9, acute renal failure 1.8, metabolic acidosis, transaminitis, lactic acidosis 8.9 anemia 7.9 g, coagulopathy with INR 1.5 radiology COVID-negative, chest x-ray no active disease. Patient was diagnosed with septic shock, acute renal failure-initially started on fluid resuscitation but after subsequent discussion the family given patient's hospice status with pancreatic cancer being admitted for comfort measures. On my exam,she is resting,able to open her eyes but unable to communicate interact, goes back to sleep quickly.  Her blood pressure is in 70s.  unable to obtain full history.  Call her daughter and obtain rest of the history, given her terminal condition they prefer her to be in comfort measures.  Blood culture grew Enterobacteriaceae patient remained persistently hypotensive w/ septic shock from enterobacteria bacteremia POA.  She has a bed available at inpatient hospice and she will be discharged today.  Family and daughter are in agreement

## 2022-01-08 ENCOUNTER — Encounter: Payer: Self-pay | Admitting: Hematology

## 2022-01-08 DIAGNOSIS — A419 Sepsis, unspecified organism: Secondary | ICD-10-CM | POA: Diagnosis not present

## 2022-01-08 DIAGNOSIS — R6521 Severe sepsis with septic shock: Secondary | ICD-10-CM | POA: Diagnosis not present

## 2022-01-08 DIAGNOSIS — A498 Other bacterial infections of unspecified site: Secondary | ICD-10-CM

## 2022-01-08 LAB — BLOOD CULTURE ID PANEL (REFLEXED) - BCID2

## 2022-01-08 NOTE — Progress Notes (Signed)
PHARMACY - PHYSICIAN COMMUNICATION CRITICAL VALUE ALERT - BLOOD CULTURE IDENTIFICATION (BCID)  Tina Patel is an 81 y.o. female who presented to [facility] on 01/07/2022 with a chief complaint of AMS.   Assessment:  PMH significant for pancreatic cancer currently under hospice care. Admitted with ARF, sepsis - but pursuing comfort measures only at this time awaiting bed at residential hospice.   BCID + 2/2 enterobacter cloacae complex (no ESBL resistance detected)  Name of physician (or Provider) Contacted: Dr. Lupita Leash  Current antibiotics: None - comfort measures  Changes to prescribed antibiotics: None at this time - anticipating discharge to residential hospice. If goals of care change and antibiotics are initiated, would recommend cefepime.  Results for orders placed or performed during the hospital encounter of 01/07/22  Blood Culture ID Panel (Reflexed) (Collected: 01/07/2022  1:20 PM)  Result Value Ref Range   Enterococcus faecalis NOT DETECTED NOT DETECTED   Enterococcus Faecium NOT DETECTED NOT DETECTED   Listeria monocytogenes NOT DETECTED NOT DETECTED   Staphylococcus species NOT DETECTED NOT DETECTED   Staphylococcus aureus (BCID) NOT DETECTED NOT DETECTED   Staphylococcus epidermidis NOT DETECTED NOT DETECTED   Staphylococcus lugdunensis NOT DETECTED NOT DETECTED   Streptococcus species NOT DETECTED NOT DETECTED   Streptococcus agalactiae NOT DETECTED NOT DETECTED   Streptococcus pneumoniae NOT DETECTED NOT DETECTED   Streptococcus pyogenes NOT DETECTED NOT DETECTED   A.calcoaceticus-baumannii NOT DETECTED NOT DETECTED   Bacteroides fragilis NOT DETECTED NOT DETECTED   Enterobacterales DETECTED (A) NOT DETECTED   Enterobacter cloacae complex DETECTED (A) NOT DETECTED   Escherichia coli NOT DETECTED NOT DETECTED   Klebsiella aerogenes NOT DETECTED NOT DETECTED   Klebsiella oxytoca NOT DETECTED NOT DETECTED   Klebsiella pneumoniae NOT DETECTED NOT DETECTED   Proteus  species NOT DETECTED NOT DETECTED   Salmonella species NOT DETECTED NOT DETECTED   Serratia marcescens NOT DETECTED NOT DETECTED   Haemophilus influenzae NOT DETECTED NOT DETECTED   Neisseria meningitidis NOT DETECTED NOT DETECTED   Pseudomonas aeruginosa NOT DETECTED NOT DETECTED   Stenotrophomonas maltophilia NOT DETECTED NOT DETECTED   Candida albicans NOT DETECTED NOT DETECTED   Candida auris NOT DETECTED NOT DETECTED   Candida glabrata NOT DETECTED NOT DETECTED   Candida krusei NOT DETECTED NOT DETECTED   Candida parapsilosis NOT DETECTED NOT DETECTED   Candida tropicalis NOT DETECTED NOT DETECTED   Cryptococcus neoformans/gattii NOT DETECTED NOT DETECTED   CTX-M ESBL NOT DETECTED NOT DETECTED   Carbapenem resistance IMP NOT DETECTED NOT DETECTED   Carbapenem resistance KPC NOT DETECTED NOT DETECTED   Carbapenem resistance NDM NOT DETECTED NOT DETECTED   Carbapenem resist OXA 48 LIKE NOT DETECTED NOT DETECTED   Carbapenem resistance VIM NOT DETECTED NOT DETECTED    Lenis Noon, PharmD 01/08/2022  7:16 AM

## 2022-01-08 NOTE — Plan of Care (Signed)
Pt discharged to [facility].  Reported called by RN.  PIV removed.  Port remains accessed per [facility] RN's request.  EMS personnel transported off unit and provided with discharge documentation.  Problem: Education: Goal: Knowledge of General Education information will improve Description: Including pain rating scale, medication(s)/side effects and non-pharmacologic comfort measures 01/08/2022 1615 by Hewitt Blade, Maudean Hoffmann D, RN Outcome: Adequate for Discharge 01/08/2022 1039 by Hewitt Blade, Imagine Nest D, RN Outcome: Progressing   Problem: Health Behavior/Discharge Planning: Goal: Ability to manage health-related needs will improve 01/08/2022 1615 by Hewitt Blade, Zorianna Taliaferro D, RN Outcome: Adequate for Discharge 01/08/2022 1039 by Hewitt Blade, Tyleah Loh D, RN Outcome: Progressing   Problem: Clinical Measurements: Goal: Ability to maintain clinical measurements within normal limits will improve 01/08/2022 1615 by Hewitt Blade, Kely Dohn D, RN Outcome: Adequate for Discharge 01/08/2022 1039 by Hewitt Blade, Haydee Jabbour D, RN Outcome: Progressing Goal: Will remain free from infection 01/08/2022 1615 by Hewitt Blade, Rabecka Brendel D, RN Outcome: Adequate for Discharge 01/08/2022 1039 by Hewitt Blade, Jairon Ripberger D, RN Outcome: Progressing Goal: Diagnostic test results will improve 01/08/2022 1615 by Hewitt Blade, Tigerlily Christine D, RN Outcome: Adequate for Discharge 01/08/2022 1039 by Hewitt Blade, Sava Proby D, RN Outcome: Progressing Goal: Respiratory complications will improve 01/08/2022 1615 by Hewitt Blade, Joellen Tullos D, RN Outcome: Adequate for Discharge 01/08/2022 1039 by Hewitt Blade, Chelcy Bolda D, RN Outcome: Progressing Goal: Cardiovascular complication will be avoided 01/08/2022 1615 by Hewitt Blade, Aladdin Kollmann D, RN Outcome: Adequate for Discharge 01/08/2022 1039 by Hewitt Blade, Zhion Pevehouse D, RN Outcome: Progressing   Problem: Activity: Goal: Risk for activity intolerance will  decrease 01/08/2022 1615 by Hewitt Blade, Santi Troung D, RN Outcome: Adequate for Discharge 01/08/2022 1039 by Hewitt Blade, Amberia Bayless D, RN Outcome: Progressing   Problem: Nutrition: Goal: Adequate nutrition will be maintained 01/08/2022 1615 by Hewitt Blade, Ebrahim Deremer D, RN Outcome: Adequate for Discharge 01/08/2022 1039 by Hewitt Blade, Banesa Tristan D, RN Outcome: Not Progressing   Problem: Coping: Goal: Level of anxiety will decrease 01/08/2022 1615 by Hewitt Blade, Bailyn Spackman D, RN Outcome: Adequate for Discharge 01/08/2022 1039 by Hewitt Blade, Refugio Vandevoorde D, RN Outcome: Progressing   Problem: Elimination: Goal: Will not experience complications related to bowel motility 01/08/2022 1615 by Hewitt Blade, Gianni Fuchs D, RN Outcome: Adequate for Discharge 01/08/2022 1039 by Hewitt Blade, Crockett Rallo D, RN Outcome: Progressing Goal: Will not experience complications related to urinary retention 01/08/2022 1615 by Hewitt Blade, Xan Sparkman D, RN Outcome: Adequate for Discharge 01/08/2022 1039 by Hewitt Blade, Uzziel Russey D, RN Outcome: Progressing   Problem: Pain Managment: Goal: General experience of comfort will improve 01/08/2022 1615 by Hewitt Blade, Bernd Crom D, RN Outcome: Adequate for Discharge 01/08/2022 1039 by Hewitt Blade, Tyana Butzer D, RN Outcome: Progressing   Problem: Safety: Goal: Ability to remain free from injury will improve 01/08/2022 1615 by Hewitt Blade, Kimori Tartaglia D, RN Outcome: Adequate for Discharge 01/08/2022 1039 by Hewitt Blade, Alamin Mccuiston D, RN Outcome: Progressing   Problem: Skin Integrity: Goal: Risk for impaired skin integrity will decrease 01/08/2022 1615 by Hewitt Blade, Jaken Fregia D, RN Outcome: Adequate for Discharge 01/08/2022 1039 by Hewitt Blade, Deette Revak D, RN Outcome: Progressing   Problem: Education: Goal: Knowledge of the prescribed therapeutic regimen will improve 01/08/2022 1615 by Hewitt Blade, Makesha Belitz D, RN Outcome: Adequate for  Discharge 01/08/2022 1039 by Hewitt Blade, Olukemi Panchal D, RN Outcome: Not Progressing   Problem: Coping: Goal: Ability to identify and develop effective coping behavior will improve 01/08/2022 1615 by Hewitt Blade, Linder Prajapati D, RN Outcome: Adequate for Discharge 01/08/2022 1039 by Hewitt Blade, Gerrell Tabet  D, RN Outcome: Progressing   Problem: Clinical Measurements: Goal: Quality of life will improve 01/08/2022 1615 by Hewitt Blade, Demitria Hay D, RN Outcome: Adequate for Discharge 01/08/2022 1039 by Hewitt Blade, Rhyanna Sorce D, RN Outcome: Not Progressing   Problem: Respiratory: Goal: Verbalizations of increased ease of respirations will increase 01/08/2022 1615 by Hewitt Blade, Kili Gracy D, RN Outcome: Adequate for Discharge 01/08/2022 1039 by Hewitt Blade, Stein Windhorst D, RN Outcome: Progressing   Problem: Role Relationship: Goal: Family's ability to cope with current situation will improve 01/08/2022 1615 by Hewitt Blade, Loistine Eberlin D, RN Outcome: Adequate for Discharge 01/08/2022 1039 by Hewitt Blade, Dorthy Hustead D, RN Outcome: Progressing Goal: Ability to verbalize concerns, feelings, and thoughts to partner or family member will improve 01/08/2022 1615 by Hewitt Blade, Andrell Tallman D, RN Outcome: Adequate for Discharge 01/08/2022 1039 by Hewitt Blade, Carletta Feasel D, RN Outcome: Progressing   Problem: Pain Management: Goal: Satisfaction with pain management regimen will improve 01/08/2022 1615 by Hewitt Blade, Zanetta Dehaan D, RN Outcome: Adequate for Discharge 01/08/2022 1039 by Hewitt Blade, Riddick Nuon D, RN Outcome: Progressing

## 2022-01-08 NOTE — TOC Transition Note (Addendum)
Transition of Care [facility]) - CM/SW Discharge Note   Patient Details  Name: Tina Patel MRN: [id number] Date of Birth: 22-May-1940  [facility]) CM/SW Contact:  Servando Snare, LCSW Phone Number: 01/08/2022, 10:47 AM   Clinical Narrative:   Patient to dc to [facility] via [place]. Patient is an active hospice patient.  RN, please call report to [phone] prior to patient leaving the unit.    Final next level of care: [place] Barriers to Discharge: No Barriers Identified   Patient Goals and CMS Choice     Choice offered to / list presented to : NA  Discharge Placement              Patient chooses bed at:  ([facility]) Patient to be transferred to facility by: Ambulance      Discharge Plan and Services                DME Arranged: N/A DME Agency: NA         HH Agency: NA        Social Determinants of Health (SDOH) Interventions     Readmission Risk Interventions     No data to display

## 2022-01-08 NOTE — Plan of Care (Signed)
No c/o pain this am.  BP soft.  Pt still able to move on her own.  Problem: Nutrition: Goal: Adequate nutrition will be maintained Outcome: Not Progressing   Problem: Education: Goal: Knowledge of the prescribed therapeutic regimen will improve Outcome: Not Progressing   Problem: Clinical Measurements: Goal: Quality of life will improve Outcome: Not Progressing   Problem: Education: Goal: Knowledge of General Education information will improve Description: Including pain rating scale, medication(s)/side effects and non-pharmacologic comfort measures Outcome: Progressing   Problem: Health Behavior/Discharge Planning: Goal: Ability to manage health-related needs will improve Outcome: Progressing   Problem: Clinical Measurements: Goal: Ability to maintain clinical measurements within normal limits will improve Outcome: Progressing Goal: Will remain free from infection Outcome: Progressing Goal: Diagnostic test results will improve Outcome: Progressing Goal: Respiratory complications will improve Outcome: Progressing Goal: Cardiovascular complication will be avoided Outcome: Progressing   Problem: Activity: Goal: Risk for activity intolerance will decrease Outcome: Progressing   Problem: Coping: Goal: Level of anxiety will decrease Outcome: Progressing   Problem: Elimination: Goal: Will not experience complications related to bowel motility Outcome: Progressing Goal: Will not experience complications related to urinary retention Outcome: Progressing   Problem: Pain Managment: Goal: General experience of comfort will improve Outcome: Progressing   Problem: Safety: Goal: Ability to remain free from injury will improve Outcome: Progressing   Problem: Skin Integrity: Goal: Risk for impaired skin integrity will decrease Outcome: Progressing   Problem: Coping: Goal: Ability to identify and develop effective coping behavior will improve Outcome: Progressing    Problem: Respiratory: Goal: Verbalizations of increased ease of respirations will increase Outcome: Progressing   Problem: Role Relationship: Goal: Family's ability to cope with current situation will improve Outcome: Progressing Goal: Ability to verbalize concerns, feelings, and thoughts to partner or family member will improve Outcome: Progressing   Problem: Pain Management: Goal: Satisfaction with pain management regimen will improve Outcome: Progressing

## 2022-01-08 NOTE — Discharge Summary (Signed)
Physician Discharge Summary  Tina Patel VCB:[id number] DOB: Sep 07, 1940 DOA: 01/07/2022  PCP: [name], DO  Admit date: 01/07/2022 Discharge date: 01/08/2022 Recommendations for Outpatient Follow-up:  Follow up with PCP in 1 weeks-call for appointment Please obtain BMP/CBC in one week  Discharge Dispo:  inpatient hospice Discharge Condition: Stable Code Status:   Code Status: DNR Diet recommendation:  Diet Order             Diet full liquid Room service appropriate? Yes; Fluid consistency: Thin  Diet effective now                    Brief/Interim Summary: History of pancreatic adenocarcinoma diagnosed June/2022 has had chemo, SBRT and currently under hospice care, diabetes, hyperlipidemia, hypothyroidism, diabetes mellitus, peripheral neuropathy due to chemotherapy who has had decreased vision, confusion and decreased ADL dysfunction and sent to the hospital due to worsening of her mental status. She was hypotensive in route, in the ED hypotensive up to 64/45 febrile 103.1, on vent initial work-up that showed hypokalemia 2.9, acute renal failure 1.8, metabolic acidosis, transaminitis, lactic acidosis 8.9 anemia 7.9 g, coagulopathy with INR 1.5 radiology COVID-negative, chest x-ray no active disease. Patient was diagnosed with septic shock, acute renal failure-initially started on fluid resuscitation but after subsequent discussion the family given patient's hospice status with pancreatic cancer being admitted for comfort measures. On my exam,she is resting,able to open her eyes but unable to communicate interact, goes back to sleep quickly.  Her blood pressure is in 70s.  unable to obtain full history.  Call her daughter and obtain rest of the history, given her terminal condition they prefer her to be in comfort measures.  Blood culture grew Enterobacteriaceae patient remained persistently hypotensive w/ septic shock from enterobacteria bacteremia POA.  She has a bed available at  inpatient hospice and she will be discharged today.  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  She has more duple medical issues with Septic shock POA 2/2 Enterobacteriaceae bacteremia, FTT severe protein malnutrition, AKI, hypokalemia, lactic acidosis and other issues as mentioned above.  Discussed with daughter this morning different modalities of treatment including antibiotics IV fluids pressors ICU stay  vs comfort measures and she will be continuing w/ comfort measures.  Daughter reports her father is unable to care of her- has not been able to walk and has had minimal intake    Consults: Palliative care Subjective: More alert awake this morning  Was sleeping all morning per nurse.    Discharge Exam: Vitals:   01/08/22 0458 01/08/22 0500  BP: (!) 69/48 (!) 62/35  Pulse: 100 100  Resp: 18   Temp: 97.9 F (36.6 C)   SpO2: 97%    General: Pt is alert, awake, not in acute distress Cardiovascular:  RRR, S1/S2 +, no rubs, no gallops Respiratory: CTA bilaterally, no wheezing, no rhonchi Abdominal: Soft, NT, ND, bowel sounds + Extremities: no edema, no cyanosis  Discharge Instructions   Allergies as of 01/08/2022   No Known Allergies      Medication List     STOP taking  these medications    amLODipine 5 MG tablet Commonly known as: NORVASC   blood glucose meter kit and supplies Kit   furosemide 20 MG tablet Commonly known as: LASIX   gabapentin 100 MG capsule Commonly known as: NEURONTIN   insulin glargine 100 UNIT/ML Solostar Pen Commonly known as: LANTUS   levothyroxine 75 MCG tablet Commonly known as: SYNTHROID   lisinopril 10 MG tablet Commonly known as: ZESTRIL   ondansetron 8 MG tablet Commonly known as: Zofran   OVER THE COUNTER MEDICATION   pantoprazole 20 MG tablet Commonly known as: Protonix   Vitamin D (Ergocalciferol) 50 MCG (2000 UT) Caps       TAKE these medications    lidocaine-prilocaine cream Commonly known as: EMLA Apply 1 application topically as needed.   mirtazapine 7.5 MG tablet Commonly known as: REMERON TAKE 1 TABLET BY MOUTH EVERYDAY AT BEDTIME   prochlorperazine 10 MG tablet Commonly known as: COMPAZINE TAKE ONE TABLET BY MOUTH (10 mg) every 6 HOURS AS NEEDED (nausea or vomiting)   traMADol 50 MG tablet Commonly known as: ULTRAM TAKE ONE TABLET BY MOUTH every 12 HOURS AS NEEDED        No Known Allergies  The results of significant diagnostics from this hospitalization (including imaging, microbiology, ancillary and laboratory) are listed below for reference.    Microbiology: Recent Results (from the past 240 hour(s))  Blood Culture (routine x 2)     Status: None (Preliminary result)   Collection Time: 01/07/22  1:20 PM   Specimen: BLOOD  Result Value Ref Range Status   Specimen Description   Final    BLOOD PORTA CATH Performed at [facility] [address], [place], [place] [address]    Special Requests   Final    BOTTLES DRAWN AEROBIC AND ANAEROBIC Blood Culture results may not be optimal due to an excessive volume of blood received in culture bottles Performed at [facility] [address], [place], [place] [address]    Culture  Setup Time    Final    IN BOTH AEROBIC AND ANAEROBIC BOTTLES GRAM NEGATIVE RODS Organism ID to follow CRITICAL RESULT CALLED TO, READ BACK BY AND VERIFIED WITH:  C/ PHARMD C. SHADE 01/08/22 0704 A. LAFRANCE Performed at [facility], [place] [address] [place], [place] [address]    Culture PENDING  Incomplete   Report Status PENDING  Incomplete  Blood Culture ID Panel (Reflexed)     Status: Abnormal   Collection Time: 01/07/22  1:20 PM  Result Value Ref Range Status   Enterococcus faecalis NOT DETECTED NOT DETECTED Final   Enterococcus Faecium NOT DETECTED NOT DETECTED Final   Listeria monocytogenes NOT DETECTED NOT DETECTED Final   Staphylococcus species NOT DETECTED NOT DETECTED Final   Staphylococcus aureus (BCID) NOT DETECTED NOT DETECTED Final   Staphylococcus epidermidis NOT DETECTED NOT DETECTED Final   Staphylococcus lugdunensis NOT DETECTED NOT DETECTED Final   Streptococcus species NOT DETECTED NOT DETECTED Final   Streptococcus agalactiae NOT DETECTED NOT DETECTED Final   Streptococcus pneumoniae NOT DETECTED NOT DETECTED Final   Streptococcus pyogenes NOT DETECTED NOT DETECTED Final   A.calcoaceticus-baumannii NOT  DETECTED NOT DETECTED Final   Bacteroides fragilis NOT DETECTED NOT DETECTED Final   Enterobacterales DETECTED (A) NOT DETECTED Final    Comment: Enterobacterales represent a large order of gram negative bacteria, not a single organism. CRITICAL RESULT CALLED TO, READ BACK BY AND VERIFIED WITH:  C/ PHARMD C. SHADE 01/08/22 0704 A. LAFRANCE    Enterobacter cloacae complex DETECTED (A) NOT DETECTED Final    Comment: CRITICAL RESULT CALLED TO, READ BACK BY AND VERIFIED WITH:  C/ PHARMD C. SHADE 01/08/22 0704 A. LAFRANCE    Escherichia coli NOT DETECTED NOT DETECTED Final   Klebsiella aerogenes NOT DETECTED NOT DETECTED Final   Klebsiella oxytoca NOT DETECTED NOT DETECTED Final   Klebsiella pneumoniae NOT DETECTED NOT DETECTED Final   Proteus species NOT DETECTED NOT  DETECTED Final   Salmonella species NOT DETECTED NOT DETECTED Final   Serratia marcescens NOT DETECTED NOT DETECTED Final   Haemophilus influenzae NOT DETECTED NOT DETECTED Final   Neisseria meningitidis NOT DETECTED NOT DETECTED Final   Pseudomonas aeruginosa NOT DETECTED NOT DETECTED Final   Stenotrophomonas maltophilia NOT DETECTED NOT DETECTED Final   Candida albicans NOT DETECTED NOT DETECTED Final   Candida auris NOT DETECTED NOT DETECTED Final   Candida glabrata NOT DETECTED NOT DETECTED Final   Candida krusei NOT DETECTED NOT DETECTED Final   Candida parapsilosis NOT DETECTED NOT DETECTED Final   Candida tropicalis NOT DETECTED NOT DETECTED Final   Cryptococcus neoformans/gattii NOT DETECTED NOT DETECTED Final   CTX-M ESBL NOT DETECTED NOT DETECTED Final   Carbapenem resistance IMP NOT DETECTED NOT DETECTED Final   Carbapenem resistance KPC NOT DETECTED NOT DETECTED Final   Carbapenem resistance NDM NOT DETECTED NOT DETECTED Final   Carbapenem resist OXA 48 LIKE NOT DETECTED NOT DETECTED Final   Carbapenem resistance VIM NOT DETECTED NOT DETECTED Final    Comment: Performed at [facility], [address], [place], [place] [address]  Resp Panel by RT-PCR (Flu A&B, Covid) Anterior Nasal Swab     Status: None   Collection Time: 01/07/22  1:30 PM   Specimen: Anterior Nasal Swab  Result Value Ref Range Status   SARS Coronavirus 2 by RT PCR NEGATIVE NEGATIVE Final    Comment: (NOTE) SARS-CoV-2 target nucleic acids are NOT DETECTED.  The SARS-CoV-2 RNA is generally detectable in upper respiratory specimens during the acute phase of infection. The lowest concentration of SARS-CoV-2 viral copies this assay can detect is 138 copies/mL. A negative result does not preclude SARS-Cov-2 infection and should not be used as the sole basis for treatment or other patient management decisions. A negative result may occur with  improper specimen collection/handling, submission of  specimen other than nasopharyngeal swab, presence of viral mutation(s) within the areas targeted by this assay, and inadequate number of viral copies(<138 copies/mL). A negative result must be combined with clinical observations, patient history, and epidemiological information. The expected result is Negative.  Fact Sheet for Patients:  EntrepreneurPulse.com.au  Fact Sheet for Healthcare Providers:  IncredibleEmployment.be  This test is no t yet approved or cleared by the [place] FDA and  has been authorized for detection and/or diagnosis of SARS-CoV-2 by FDA under an Emergency Use Authorization (EUA). This EUA will remain  in effect (meaning this test can be used) for the duration of the COVID-19 declaration under Section 564(b)(1) of the Act, 21 U.S.C.section 360bbb-3(b)(1), unless the authorization is terminated  or revoked sooner.       Influenza A by PCR  NEGATIVE NEGATIVE Final   Influenza B by PCR NEGATIVE NEGATIVE Final    Comment: (NOTE) The Xpert Xpress SARS-CoV-2/FLU/RSV plus assay is intended as an aid in the diagnosis of influenza from Nasopharyngeal swab specimens and should not be used as a sole basis for treatment. Nasal washings and aspirates are unacceptable for Xpert Xpress SARS-CoV-2/FLU/RSV testing.  Fact Sheet for Patients: EntrepreneurPulse.com.au  Fact Sheet for Healthcare Providers: IncredibleEmployment.be  This test is not yet approved or cleared by the [place] FDA and has been authorized for detection and/or diagnosis of SARS-CoV-2 by FDA under an Emergency Use Authorization (EUA). This EUA will remain in effect (meaning this test can be used) for the duration of the COVID-19 declaration under Section 564(b)(1) of the Act, 21 U.S.C. section 360bbb-3(b)(1), unless the authorization is terminated or revoked.  Performed at [facility], [place]  [address] [place], [place] [address]     Procedures/Studies: DG Chest Port 1 View  Result Date: 01/07/2022 CLINICAL DATA:  Questionable sepsis - evaluate for abnormality EXAM: PORTABLE CHEST 1 VIEW COMPARISON:  Chest x-ray February 09, 2021. FINDINGS: The heart size and mediastinal contours are within normal limits. Both lungs are clear. No visible pleural effusions or pneumothorax. No acute osseous abnormality. Right IJ approach Port-A-Cath with the tip projecting at the superior cavoatrial junction. IMPRESSION: No active disease. Electronically Signed   By: Margaretha Sheffield M.D.   On: 01/07/2022 13:56    Labs: BNP (last 3 results) No results for input(s): "BNP" in the last 8760 hours. Basic Metabolic Panel: Recent Labs  Lab 01/07/22 1320  NA 140  K 2.9*  CL 93*  CO2 24  GLUCOSE 157*  BUN 42*  CREATININE 1.89*  CALCIUM 8.1*   Liver Function Tests: Recent Labs  Lab 01/07/22 1320  AST 43*  ALT 22  ALKPHOS 440*  BILITOT 1.6*  PROT 6.0*  ALBUMIN 2.2*   No results for input(s): "LIPASE", "AMYLASE" in the last 168 hours. No results for input(s): "AMMONIA" in the last 168 hours. CBC: Recent Labs  Lab 01/07/22 1320  WBC 5.5  NEUTROABS 5.3  HGB 7.9*  HCT 26.0*  MCV 88.1  PLT 237   Cardiac Enzymes: No results for input(s): "CKTOTAL", "CKMB", "CKMBINDEX", "TROPONINI" in the last 168 hours. BNP: Invalid input(s): "POCBNP" CBG: No results for input(s): "GLUCAP" in the last 168 hours. D-Dimer No results for input(s): "DDIMER" in the last 72 hours. Hgb A1c No results for input(s): "HGBA1C" in the last 72 hours. Lipid Profile No results for input(s): "CHOL", "HDL", "LDLCALC", "TRIG", "CHOLHDL", "LDLDIRECT" in the last 72 hours. Thyroid function studies No results for input(s): "TSH", "T4TOTAL", "T3FREE", "THYROIDAB" in the last 72 hours.  Invalid input(s): "FREET3" Anemia work up No results for input(s): "VITAMINB12", "FOLATE", "FERRITIN", "TIBC", "IRON",  "RETICCTPCT" in the last 72 hours. Urinalysis    Component Value Date/Time   COLORURINE YELLOW 09/18/2020 1929   APPEARANCEUR CLEAR 09/18/2020 1929   LABSPEC <1.005 (L) 09/18/2020 1929   PHURINE 6.0 09/18/2020 1929   GLUCOSEU NEGATIVE 09/18/2020 1929   HGBUR NEGATIVE 09/18/2020 1929   BILIRUBINUR SMALL (A) 09/18/2020 1929   KETONESUR NEGATIVE 09/18/2020 1929   PROTEINUR NEGATIVE 09/18/2020 1929   NITRITE NEGATIVE 09/18/2020 1929   LEUKOCYTESUR TRACE (A) 09/18/2020 1929   Sepsis Labs Recent Labs  Lab 01/07/22 1320  WBC 5.5   Microbiology Recent Results (from the past 240 hour(s))  Blood Culture (routine x 2)     Status: None (Preliminary result)  Collection Time: 01/07/22  1:20 PM   Specimen: BLOOD  Result Value Ref Range Status   Specimen Description   Final    BLOOD PORTA CATH Performed at [facility] [address] [place], [place] [address]    Special Requests   Final    BOTTLES DRAWN AEROBIC AND ANAEROBIC Blood Culture results may not be optimal due to an excessive volume of blood received in culture bottles Performed at [facility] [address] [place], [place] [address]    Culture  Setup Time   Final    IN BOTH AEROBIC AND ANAEROBIC BOTTLES GRAM NEGATIVE RODS Organism ID to follow CRITICAL RESULT CALLED TO, READ BACK BY AND VERIFIED WITH:  C/ PHARMD C. SHADE 01/08/22 0704 A. LAFRANCE Performed at [facility], [place] [address] [place], [place] [address]    Culture PENDING  Incomplete   Report Status PENDING  Incomplete  Blood Culture ID Panel (Reflexed)     Status: Abnormal   Collection Time: 01/07/22  1:20 PM  Result Value Ref Range Status   Enterococcus faecalis NOT DETECTED NOT DETECTED Final   Enterococcus Faecium NOT DETECTED NOT DETECTED Final   Listeria monocytogenes NOT DETECTED NOT DETECTED Final   Staphylococcus species NOT DETECTED NOT DETECTED Final   Staphylococcus aureus (BCID) NOT DETECTED  NOT DETECTED Final   Staphylococcus epidermidis NOT DETECTED NOT DETECTED Final   Staphylococcus lugdunensis NOT DETECTED NOT DETECTED Final   Streptococcus species NOT DETECTED NOT DETECTED Final   Streptococcus agalactiae NOT DETECTED NOT DETECTED Final   Streptococcus pneumoniae NOT DETECTED NOT DETECTED Final   Streptococcus pyogenes NOT DETECTED NOT DETECTED Final   A.calcoaceticus-baumannii NOT DETECTED NOT DETECTED Final   Bacteroides fragilis NOT DETECTED NOT DETECTED Final   Enterobacterales DETECTED (A) NOT DETECTED Final    Comment: Enterobacterales represent a large order of gram negative bacteria, not a single organism. CRITICAL RESULT CALLED TO, READ BACK BY AND VERIFIED WITH:  C/ PHARMD C. SHADE 01/08/22 0704 A. LAFRANCE    Enterobacter cloacae complex DETECTED (A) NOT DETECTED Final    Comment: CRITICAL RESULT CALLED TO, READ BACK BY AND VERIFIED WITH:  C/ PHARMD C. SHADE 01/08/22 0704 A. LAFRANCE    Escherichia coli NOT DETECTED NOT DETECTED Final   Klebsiella aerogenes NOT DETECTED NOT DETECTED Final   Klebsiella oxytoca NOT DETECTED NOT DETECTED Final   Klebsiella pneumoniae NOT DETECTED NOT DETECTED Final   Proteus species NOT DETECTED NOT DETECTED Final   Salmonella species NOT DETECTED NOT DETECTED Final   Serratia marcescens NOT DETECTED NOT DETECTED Final   Haemophilus influenzae NOT DETECTED NOT DETECTED Final   Neisseria meningitidis NOT DETECTED NOT DETECTED Final   Pseudomonas aeruginosa NOT DETECTED NOT DETECTED Final   Stenotrophomonas maltophilia NOT DETECTED NOT DETECTED Final   Candida albicans NOT DETECTED NOT DETECTED Final   Candida auris NOT DETECTED NOT DETECTED Final   Candida glabrata NOT DETECTED NOT DETECTED Final   Candida krusei NOT DETECTED NOT DETECTED Final   Candida parapsilosis NOT DETECTED NOT DETECTED Final   Candida tropicalis NOT DETECTED NOT DETECTED Final   Cryptococcus neoformans/gattii NOT DETECTED NOT DETECTED Final   CTX-M  ESBL NOT DETECTED NOT DETECTED Final   Carbapenem resistance IMP NOT DETECTED NOT DETECTED Final   Carbapenem resistance KPC NOT DETECTED NOT DETECTED Final   Carbapenem resistance NDM NOT DETECTED NOT DETECTED Final   Carbapenem resist OXA 48 LIKE NOT DETECTED NOT DETECTED Final  Carbapenem resistance VIM NOT DETECTED NOT DETECTED Final    Comment: Performed at [facility], [place] [address] [place], [place] [address]  Resp Panel by RT-PCR (Flu A&B, Covid) Anterior Nasal Swab     Status: None   Collection Time: 01/07/22  1:30 PM   Specimen: Anterior Nasal Swab  Result Value Ref Range Status   SARS Coronavirus 2 by RT PCR NEGATIVE NEGATIVE Final    Comment: (NOTE) SARS-CoV-2 target nucleic acids are NOT DETECTED.  The SARS-CoV-2 RNA is generally detectable in upper respiratory specimens during the acute phase of infection. The lowest concentration of SARS-CoV-2 viral copies this assay can detect is 138 copies/mL. A negative result does not preclude SARS-Cov-2 infection and should not be used as the sole basis for treatment or other patient management decisions. A negative result may occur with  improper specimen collection/handling, submission of specimen other than nasopharyngeal swab, presence of viral mutation(s) within the areas targeted by this assay, and inadequate number of viral copies(<138 copies/mL). A negative result must be combined with clinical observations, patient history, and epidemiological information. The expected result is Negative.  Fact Sheet for Patients:  EntrepreneurPulse.com.au  Fact Sheet for Healthcare Providers:  IncredibleEmployment.be  This test is no t yet approved or cleared by the [place] FDA and  has been authorized for detection and/or diagnosis of SARS-CoV-2 by FDA under an Emergency Use Authorization (EUA). This EUA will remain  in effect (meaning this test can be used) for the duration of  the COVID-19 declaration under Section 564(b)(1) of the Act, 21 U.S.C.section 360bbb-3(b)(1), unless the authorization is terminated  or revoked sooner.       Influenza A by PCR NEGATIVE NEGATIVE Final   Influenza B by PCR NEGATIVE NEGATIVE Final    Comment: (NOTE) The Xpert Xpress SARS-CoV-2/FLU/RSV plus assay is intended as an aid in the diagnosis of influenza from Nasopharyngeal swab specimens and should not be used as a sole basis for treatment. Nasal washings and aspirates are unacceptable for Xpert Xpress SARS-CoV-2/FLU/RSV testing.  Fact Sheet for Patients: EntrepreneurPulse.com.au  Fact Sheet for Healthcare Providers: IncredibleEmployment.be  This test is not yet approved or cleared by the [place] FDA and has been authorized for detection and/or diagnosis of SARS-CoV-2 by FDA under an Emergency Use Authorization (EUA). This EUA will remain in effect (meaning this test can be used) for the duration of the COVID-19 declaration under Section 564(b)(1) of the Act, 21 U.S.C. section 360bbb-3(b)(1), unless the authorization is terminated or revoked.  Performed at [facility], [place] [address], [place], [place] [address]      Time coordinating discharge: 25 minutes  SIGNED: Antonieta Pert, MD  [facility] 01/08/2022, 10:50 AM  If 7PM-7AM, please contact night-coverage [email or web address]

## 2022-01-10 LAB — CULTURE, BLOOD (ROUTINE X 2)

## 2022-01-12 ENCOUNTER — Other Ambulatory Visit: Payer: Self-pay

## 2022-01-12 ENCOUNTER — Encounter: Payer: Self-pay | Admitting: Hematology

## 2022-01-17 DEATH — deceased

## 2022-08-17 IMAGING — US US ABDOMEN LIMITED
1 series · 14 of 25 positions shown · non-contrast
Comparison: None.

CLINICAL DATA: Jaundice

EXAM:
ULTRASOUND ABDOMEN LIMITED RIGHT UPPER QUADRANT

[Series 1: us abdomen limited ruq (liver/gb) · 14 of 87 slices shown]
[im 1/87]
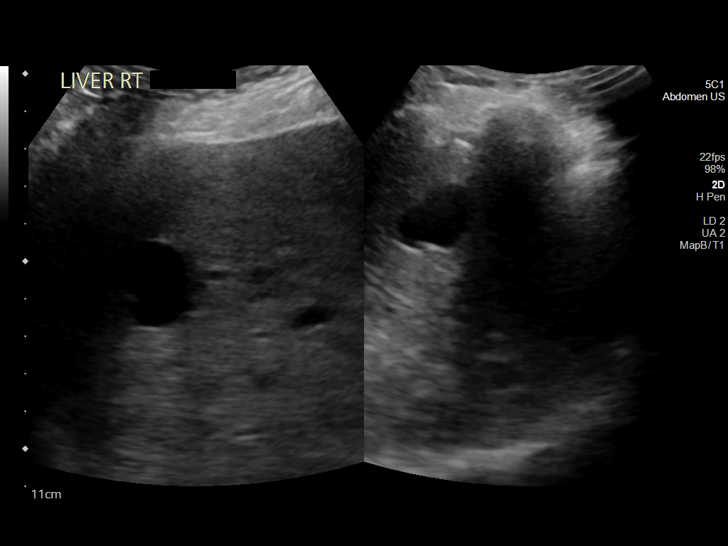
[im 8/87]
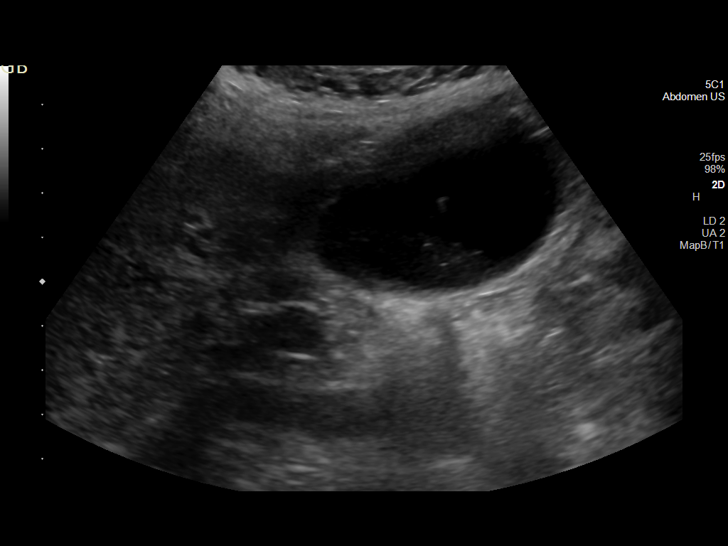
[im 15/87]
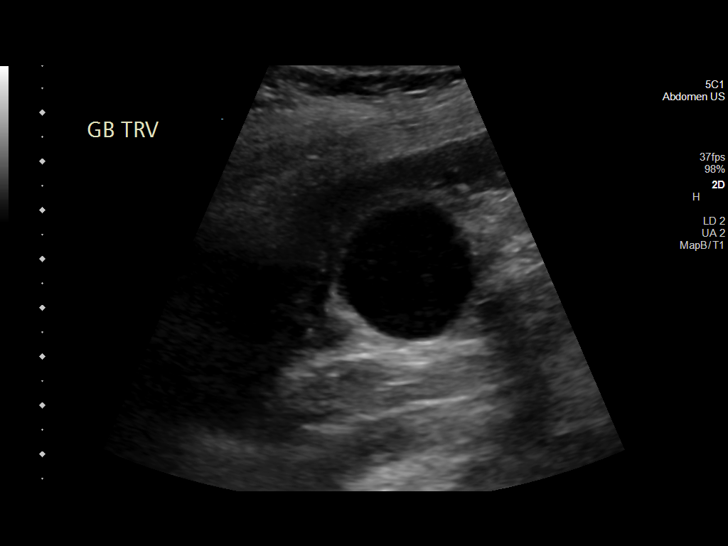
[im 22/87]
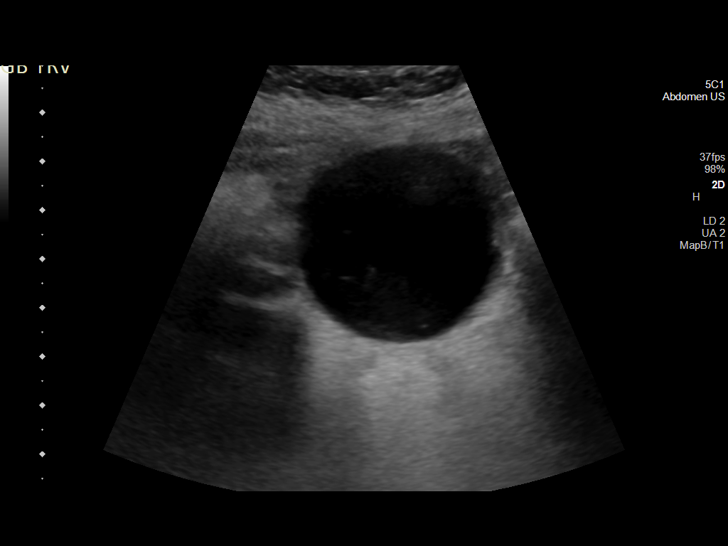
[im 29/87]
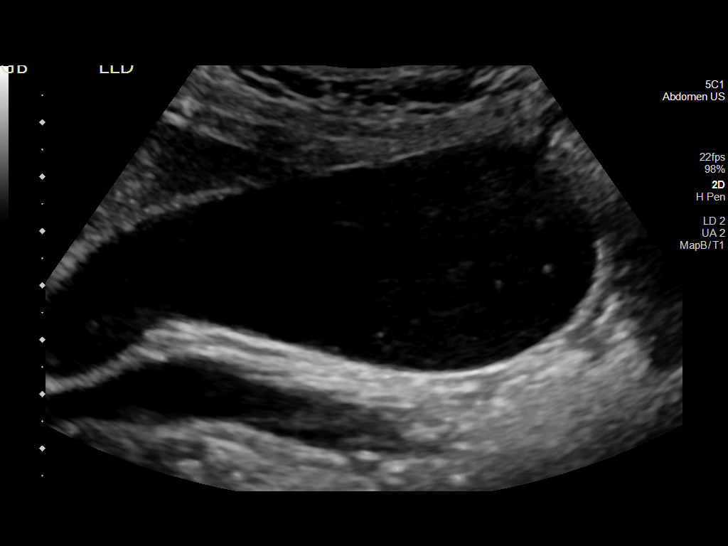
[im 33/87]
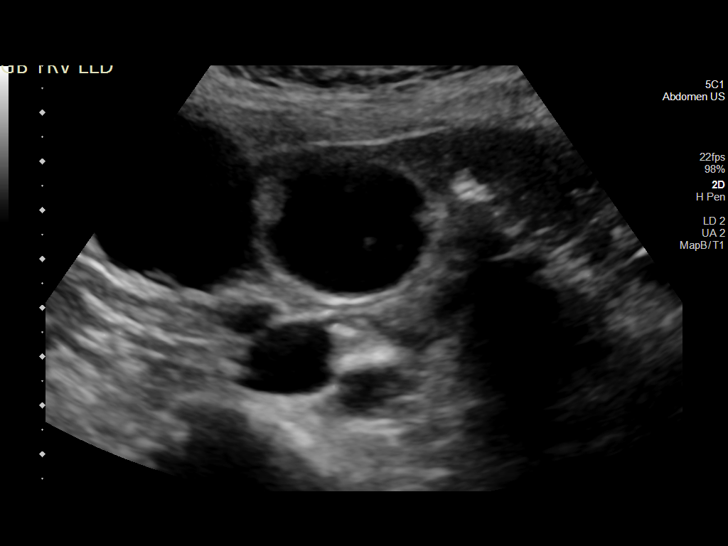
[im 40/87]
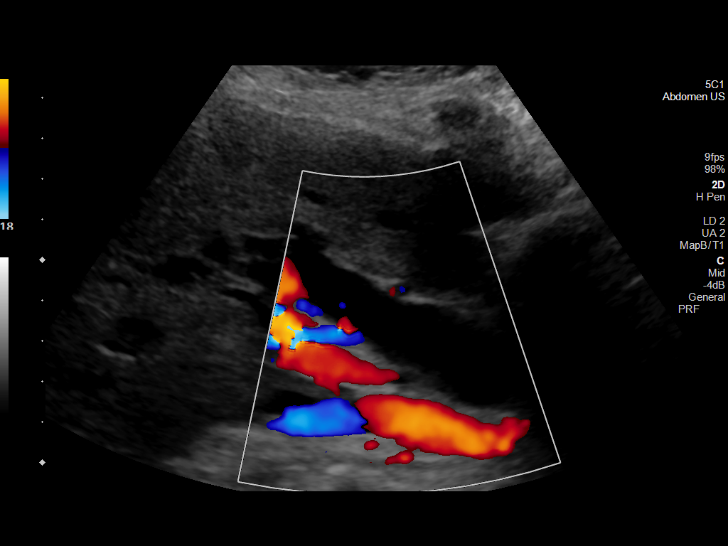
[im 47/87]
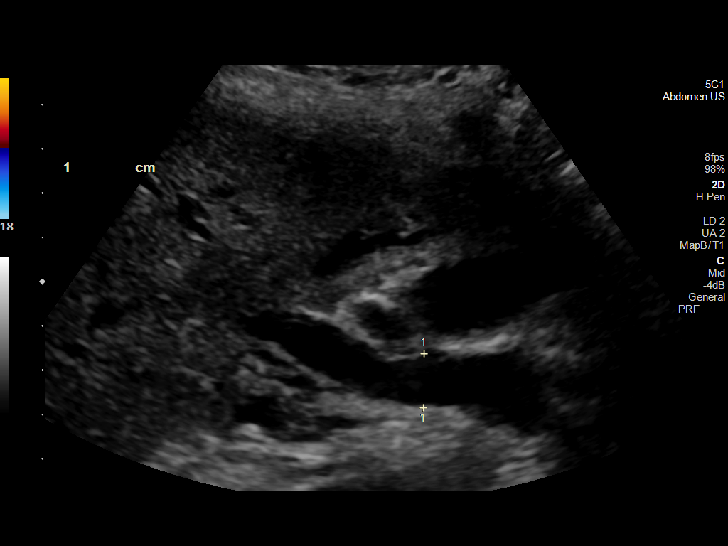
[im 54/87]
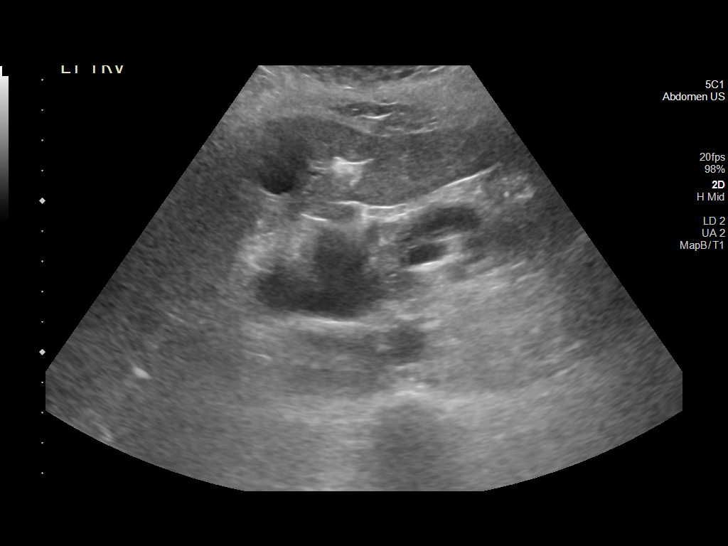
[im 58/87]
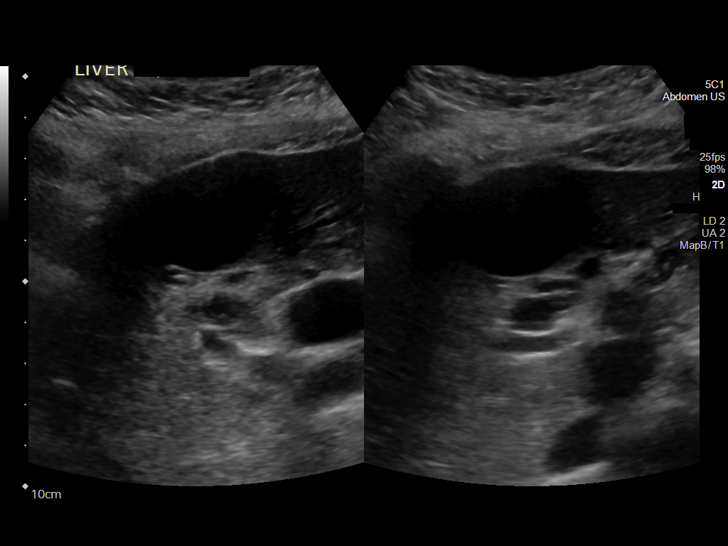
[im 65/87]
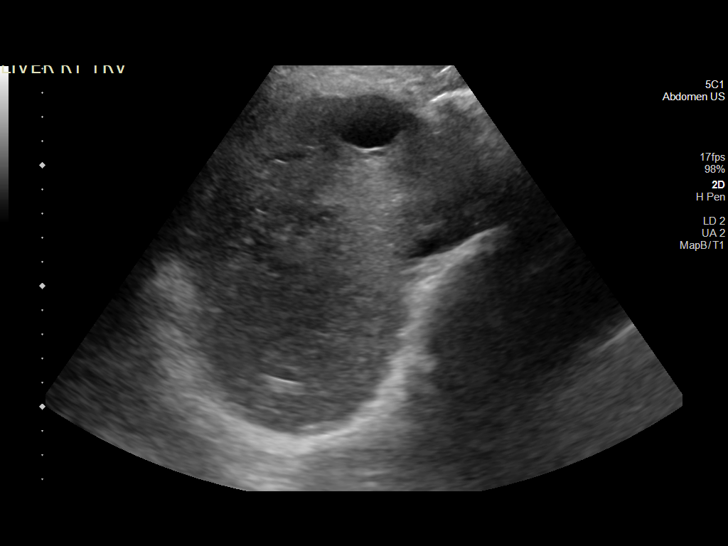
[im 72/87]
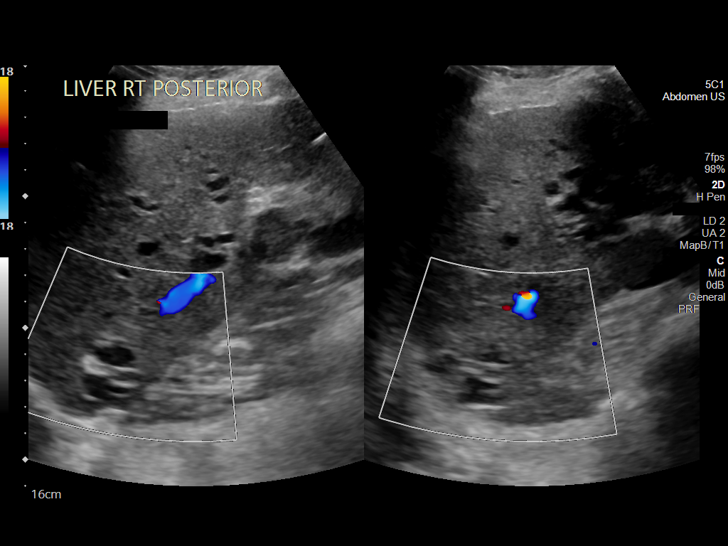
[im 79/87]
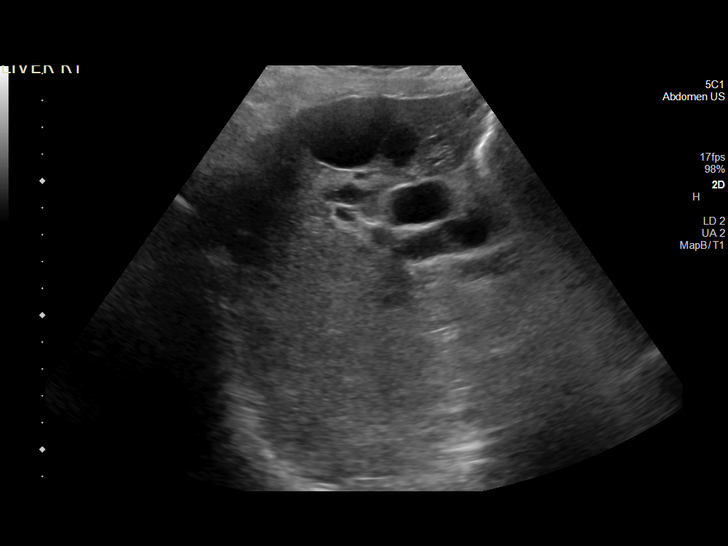
[im 87/87]
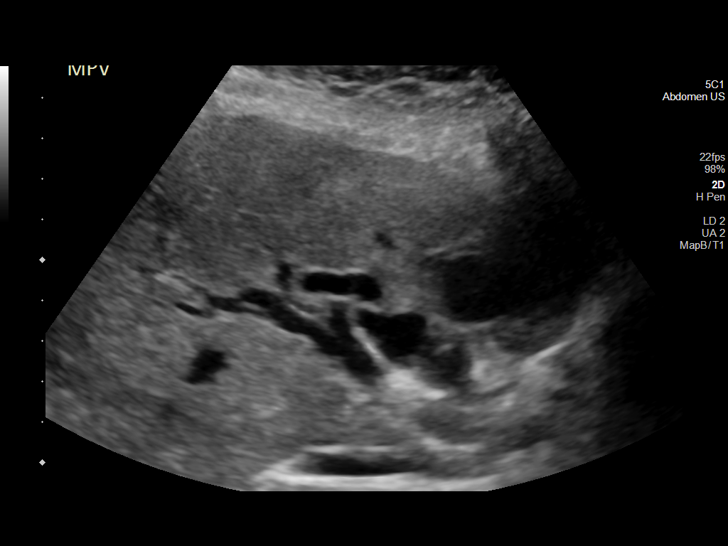

[14 of 25 positions shown; findings below may reference images not displayed]

FINDINGS: Gallbladder:

Sludge seen within the gallbladder. Gallbladder wall is thickened at
5 mm. Small amount of pericholecystic fluid.

Common bile duct:

Diameter: Dilated measuring up to 13 mm.

Liver:

Intrahepatic biliary ductal dilatation. Multiple cysts throughout
the liver measuring up to 4.6 cm. Complex area posteriorly in the
right hepatic lobe measures up to 3.6 cm which could reflect a
complex septated cyst. Portal vein is patent on color Doppler
imaging with normal direction of blood flow towards the liver.

Other: None.
IMPRESSION: Intrahepatic and extrahepatic biliary ductal dilatation. Common bile
duct dilated up to 13 mm. This could be further evaluated with MRCP
or ERCP.

Sludge within the gallbladder with associated gallbladder wall
thickening and pericholecystic fluid. Cannot exclude acute
cholecystitis.

Multiple hepatic cysts. Complex cystic area posteriorly in the right
hepatic lobe. This could also be further evaluated with MRI.

## 2022-08-20 IMAGING — CT CT ABD-PELV W/ CM
2 of 11 series · 10 of 46 positions shown, 16 images · IV contrast (OMNIPAQUE 300)
Comparison: MR abdomen, 09/20/2020

CLINICAL DATA: Metastatic disease evaluation, pancreatic malignancy

EXAM:
CT CHEST, ABDOMEN, AND PELVIS WITH CONTRAST
TECHNIQUE: Multidetector CT imaging of the chest, abdomen and pelvis was
performed following the standard protocol during bolus
administration of intravenous contrast.
CONTRAST:  80mL OMNIPAQUE IOHEXOL 300 MG/ML SOLN, additional oral
enteric contrast

[Series 5: coronal arterial · coronal · arterial · 0.72mm/px · 2 of 101 slices shown, 3 images]
[im 34/101  soft-tissue]
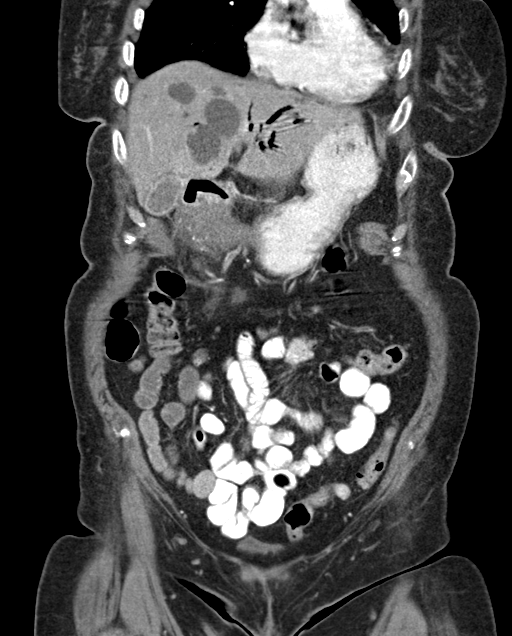
[im 34/101  bone]
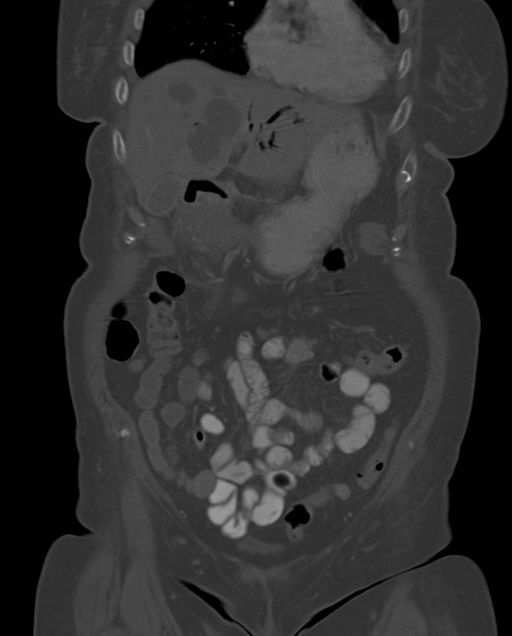
[im 67/101  soft-tissue]
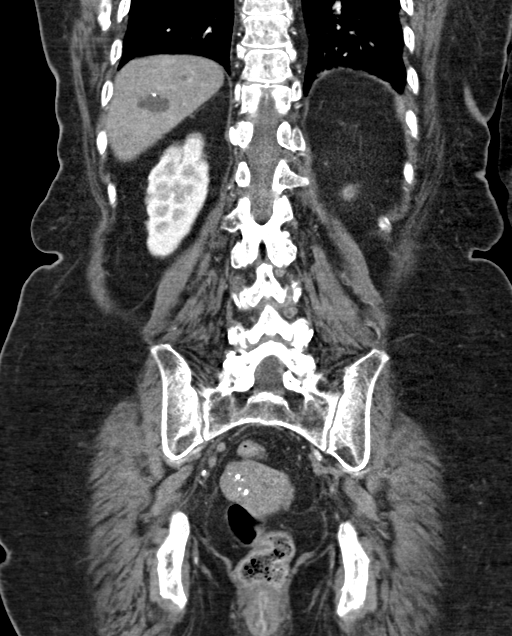

[Series 7: axial venous · axial · portal-venous · 0.67mm/px · z∈[-284,+193]mm · 8 of 205 slices shown, 13 images]
[im 23/205  soft-tissue]
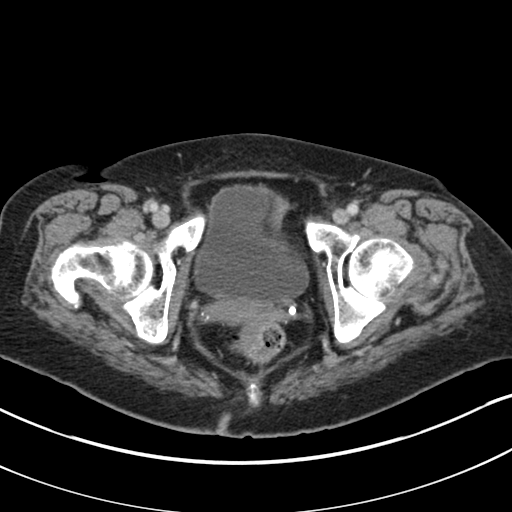
[im 23/205  bone]
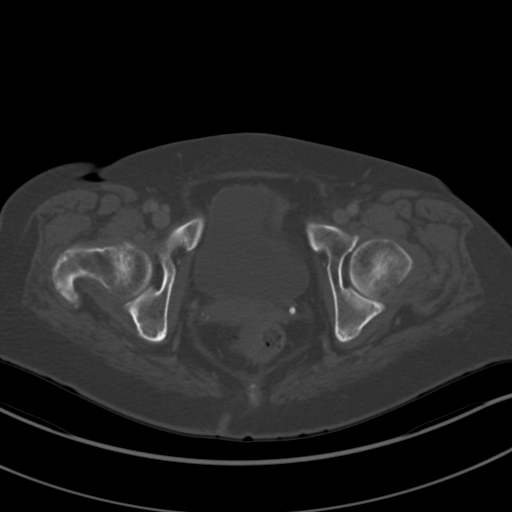
[im 46/205  soft-tissue]
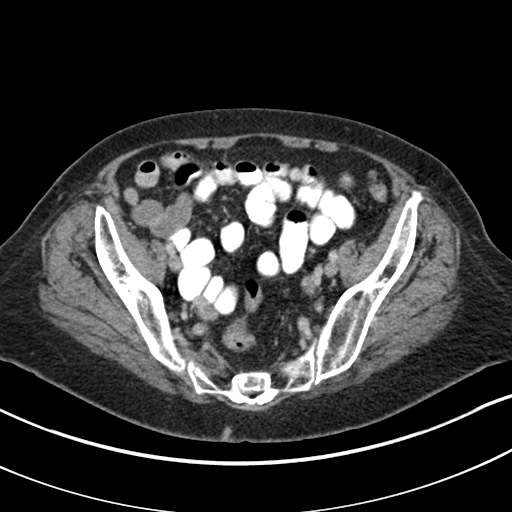
[im 69/205  soft-tissue]
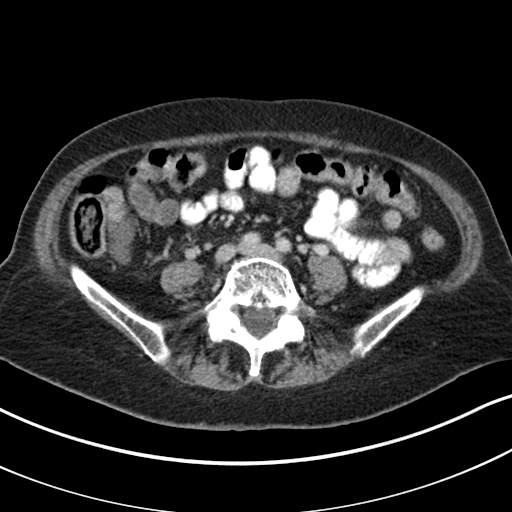
[im 91/205  soft-tissue]
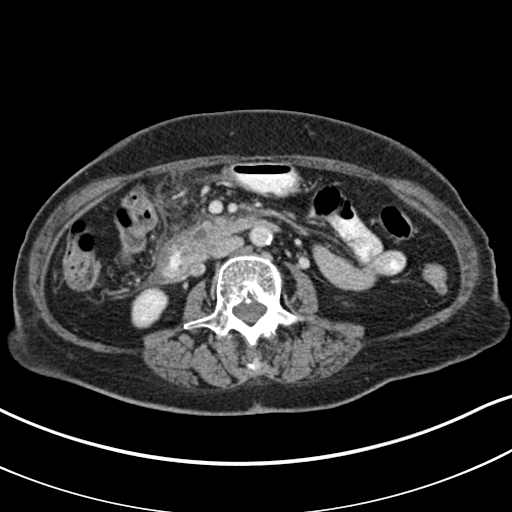
[im 114/205  soft-tissue]
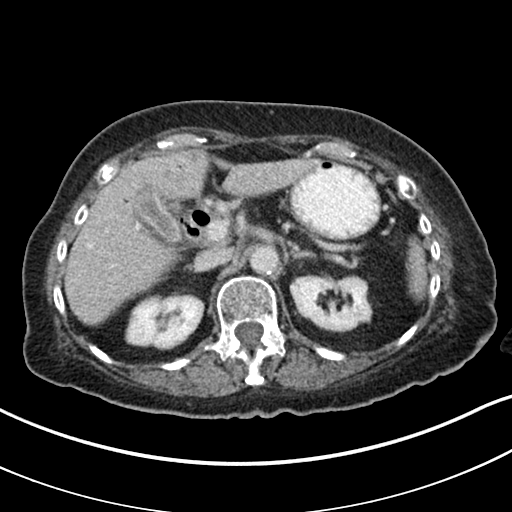
[im 114/205  lung]
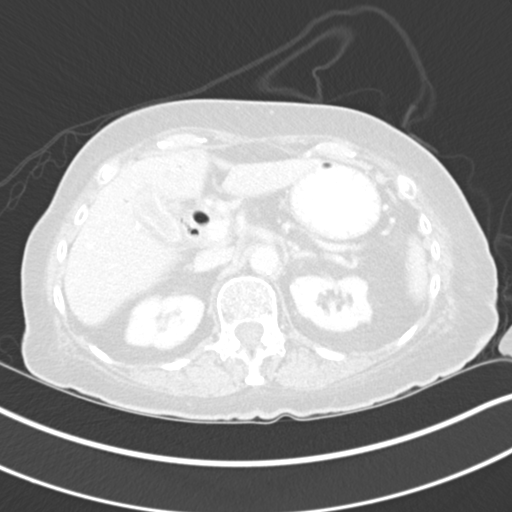
[im 137/205  soft-tissue]
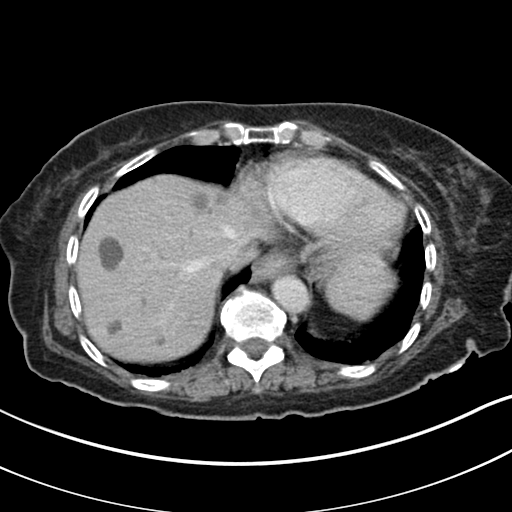
[im 137/205  lung]
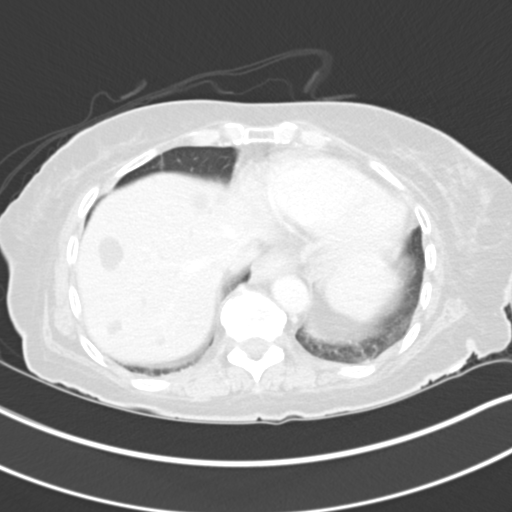
[im 159/205  soft-tissue]
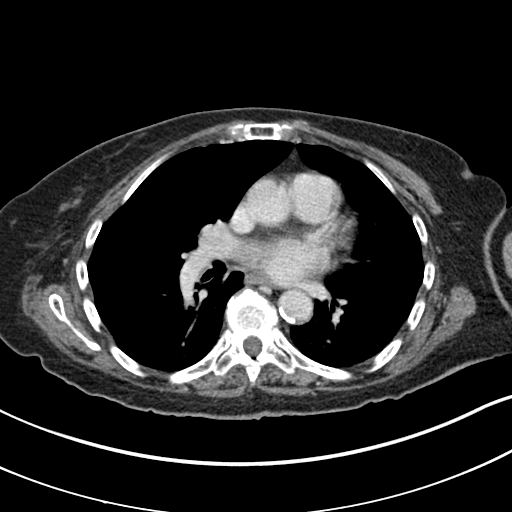
[im 159/205  lung]
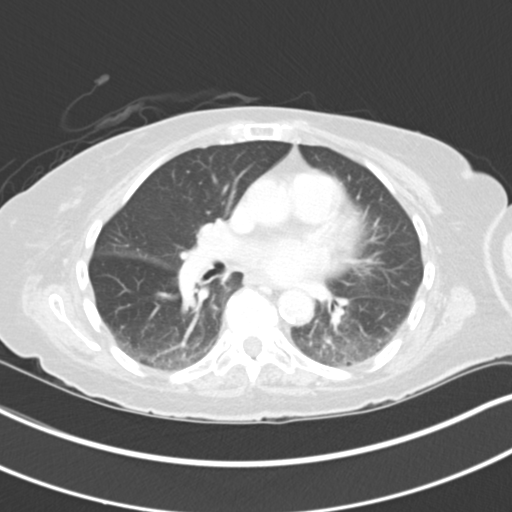
[im 182/205  soft-tissue]
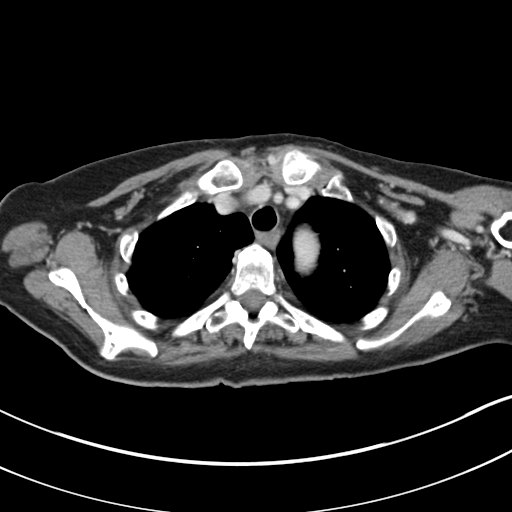
[im 182/205  lung]
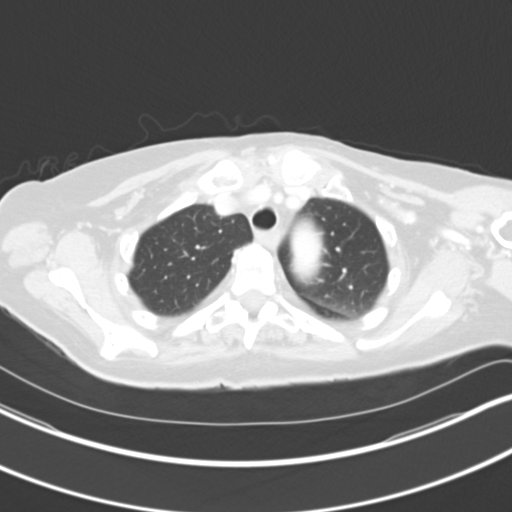

[10 of 46 positions shown; findings below may reference images not displayed]

FINDINGS: CT CHEST FINDINGS

Cardiovascular: Aortic atherosclerosis. Normal heart size. No
pericardial effusion.

Mediastinum/Nodes: No enlarged mediastinal, hilar, or axillary lymph
nodes. Thyroid gland, trachea, and esophagus demonstrate no
significant findings.

Lungs/Pleura: Lungs are clear. No pleural effusion or pneumothorax.

Musculoskeletal: No chest wall mass or suspicious bone lesions
identified.

CT ABDOMEN PELVIS FINDINGS

Hepatobiliary: No solid liver abnormality is seen. There are
numerous lobulated, fluid attenuating cysts throughout the hepatic
parenchyma, in numerous subcentimeter lesions are too small to
characterize although demonstrate no evidence of contrast
enhancement and are likely additional subcentimeter cysts.
Thickening and fat stranding about gallbladder (series 7, image 93).
Status post common bile duct stent and post stenting pneumobilia.

Pancreas: Heterogeneous mass of the central pancreatic head
measuring 4.5 x 3.3 cm (series 7, image 108). There is atrophy of
the distal pancreatic parenchyma with diffuse pancreatic ductal
dilatation up to 0.8 cm. Mass appears to closely abut the lateral
surface of the portal vein near the confluence (series 7, image
100). The superior mesenteric artery is well separated by a fat
plane (series 2, image 52), as is the hepatic artery (series 2,
image 42).

Spleen: Normal in size without significant abnormality.

Adrenals/Urinary Tract: Adrenal glands are unremarkable. Kidneys are
normal, without renal calculi, solid lesion, or hydronephrosis.
Bladder is unremarkable.

Stomach/Bowel: Stomach is within normal limits. Fat stranding about
the duodenal bulb and descending duodenum. Appendix appears normal.
No evidence of bowel wall thickening, distention, or inflammatory
changes. Descending and sigmoid diverticulosis.

Vascular/Lymphatic: Aortic atherosclerosis. No enlarged abdominal or
pelvic lymph nodes.

Reproductive: No mass or other abnormality.

Other: No abdominal wall hernia or abnormality. No abdominopelvic
ascites.

Musculoskeletal: No acute or significant osseous findings.
IMPRESSION: 1. Heterogeneous mass of the central pancreatic head measuring 4.5 x
3.3 cm. There is atrophy of the distal pancreatic parenchyma with
diffuse pancreatic ductal dilatation up to 0.8 cm. Mass appears to
closely abut the lateral surface of the portal vein near the
confluence. The superior mesenteric artery is well separated by a
fat plane, as is the hepatic artery. Findings are consistent with
pancreatic adenocarcinoma. The exact borders and size of the mass
are better delineated by prior MR.
2. No evidence of metastatic disease in the chest, abdomen, or
pelvis.
3. There are numerous lobulated, fluid attenuating cysts throughout
the hepatic parenchyma, numerous additional subcentimeter lesions
are too small to characterize although demonstrate no evidence of
contrast enhancement and are likely additional subcentimeter cysts.
Attention on follow-up.
4. Status post common bile duct stent with post stenting
pneumobilia.
5. Thickening and fat stranding about the gallbladder as well as
about the adjacent duodenal bulb and descending duodenum, of
uncertain etiology, concerning for cholecystitis, enteritis, and/or
pancreatitis, possibly related to recent endoscopic procedure.

Aortic Atherosclerosis ([id number]-H32.2).
